# Patient Record
Sex: Male | Born: 1965
Health system: Southern US, Community
[De-identification: ages and names within clinical notes are randomized; demographics above are authoritative.]

## PROBLEM LIST (undated history)

## (undated) DIAGNOSIS — I472 Ventricular tachycardia, unspecified: Secondary | ICD-10-CM

## (undated) DIAGNOSIS — G4733 Obstructive sleep apnea (adult) (pediatric): Secondary | ICD-10-CM

## (undated) DIAGNOSIS — I509 Heart failure, unspecified: Secondary | ICD-10-CM

## (undated) DIAGNOSIS — Z7901 Long term (current) use of anticoagulants: Secondary | ICD-10-CM

## (undated) DIAGNOSIS — Z973 Presence of spectacles and contact lenses: Secondary | ICD-10-CM

## (undated) DIAGNOSIS — I499 Cardiac arrhythmia, unspecified: Secondary | ICD-10-CM

## (undated) DIAGNOSIS — Z9889 Other specified postprocedural states: Secondary | ICD-10-CM

## (undated) DIAGNOSIS — Z9989 Dependence on other enabling machines and devices: Secondary | ICD-10-CM

## (undated) DIAGNOSIS — Z95810 Presence of automatic (implantable) cardiac defibrillator: Secondary | ICD-10-CM

## (undated) DIAGNOSIS — R112 Nausea with vomiting, unspecified: Secondary | ICD-10-CM

## (undated) DIAGNOSIS — I428 Other cardiomyopathies: Secondary | ICD-10-CM

## (undated) DIAGNOSIS — I480 Paroxysmal atrial fibrillation: Secondary | ICD-10-CM

## (undated) HISTORY — DX: Ventricular tachycardia, unspecified: I47.20

## (undated) HISTORY — DX: Long term (current) use of anticoagulants: Z79.01

## (undated) HISTORY — PX: ATRIAL FIBRILLATION ABLATION: SHX5732

## (undated) HISTORY — DX: Paroxysmal atrial fibrillation: I48.0

## (undated) HISTORY — DX: Ventricular tachycardia: I47.2

---

## 1993-03-25 HISTORY — PX: SKIN GRAFT: SHX250

## 2002-01-10 ENCOUNTER — Encounter: Payer: Self-pay | Admitting: Occupational Medicine

## 2002-01-10 ENCOUNTER — Encounter: Admission: RE | Admit: 2002-01-10 | Discharge: 2002-01-10 | Payer: Self-pay | Admitting: Occupational Medicine

## 2003-04-22 ENCOUNTER — Ambulatory Visit (HOSPITAL_COMMUNITY): Admission: RE | Admit: 2003-04-22 | Discharge: 2003-04-22 | Payer: Self-pay | Admitting: Pulmonary Disease

## 2003-05-01 ENCOUNTER — Ambulatory Visit (HOSPITAL_COMMUNITY): Admission: RE | Admit: 2003-05-01 | Discharge: 2003-05-01 | Payer: Self-pay | Admitting: *Deleted

## 2003-05-14 ENCOUNTER — Ambulatory Visit (HOSPITAL_COMMUNITY): Admission: RE | Admit: 2003-05-14 | Discharge: 2003-05-14 | Payer: Self-pay | Admitting: *Deleted

## 2003-05-22 ENCOUNTER — Ambulatory Visit (HOSPITAL_COMMUNITY): Admission: RE | Admit: 2003-05-22 | Discharge: 2003-05-22 | Payer: Self-pay | Admitting: *Deleted

## 2003-08-23 ENCOUNTER — Observation Stay (HOSPITAL_COMMUNITY): Admission: AD | Admit: 2003-08-23 | Discharge: 2003-08-23 | Payer: Self-pay | Admitting: *Deleted

## 2003-10-29 ENCOUNTER — Other Ambulatory Visit: Admission: RE | Admit: 2003-10-29 | Discharge: 2003-10-29 | Payer: Self-pay | Admitting: Dermatology

## 2004-01-14 ENCOUNTER — Ambulatory Visit (HOSPITAL_COMMUNITY): Admission: RE | Admit: 2004-01-14 | Discharge: 2004-01-14 | Payer: Self-pay | Admitting: *Deleted

## 2004-09-07 ENCOUNTER — Ambulatory Visit: Payer: Self-pay | Admitting: *Deleted

## 2004-10-12 ENCOUNTER — Ambulatory Visit: Payer: Self-pay | Admitting: Internal Medicine

## 2004-11-10 ENCOUNTER — Ambulatory Visit: Payer: Self-pay | Admitting: *Deleted

## 2004-11-27 ENCOUNTER — Ambulatory Visit: Payer: Self-pay | Admitting: Internal Medicine

## 2004-12-11 ENCOUNTER — Ambulatory Visit: Payer: Self-pay | Admitting: *Deleted

## 2005-01-08 ENCOUNTER — Ambulatory Visit: Payer: Self-pay | Admitting: Cardiology

## 2005-03-02 ENCOUNTER — Ambulatory Visit: Payer: Self-pay | Admitting: Cardiology

## 2005-03-11 ENCOUNTER — Ambulatory Visit: Payer: Self-pay | Admitting: *Deleted

## 2005-03-18 ENCOUNTER — Ambulatory Visit: Payer: Self-pay | Admitting: *Deleted

## 2005-03-29 ENCOUNTER — Ambulatory Visit: Payer: Self-pay | Admitting: Cardiology

## 2005-04-06 ENCOUNTER — Ambulatory Visit: Payer: Self-pay | Admitting: *Deleted

## 2005-04-28 ENCOUNTER — Ambulatory Visit: Payer: Self-pay | Admitting: Cardiology

## 2005-05-21 ENCOUNTER — Ambulatory Visit: Payer: Self-pay | Admitting: Cardiology

## 2005-07-06 ENCOUNTER — Ambulatory Visit: Payer: Self-pay | Admitting: *Deleted

## 2005-08-05 ENCOUNTER — Ambulatory Visit: Payer: Self-pay | Admitting: Internal Medicine

## 2005-09-07 ENCOUNTER — Ambulatory Visit: Payer: Self-pay | Admitting: *Deleted

## 2005-10-05 ENCOUNTER — Ambulatory Visit: Payer: Self-pay | Admitting: Cardiology

## 2005-11-09 ENCOUNTER — Ambulatory Visit: Payer: Self-pay | Admitting: *Deleted

## 2005-12-13 ENCOUNTER — Ambulatory Visit: Payer: Self-pay | Admitting: *Deleted

## 2006-01-10 ENCOUNTER — Ambulatory Visit: Payer: Self-pay | Admitting: Internal Medicine

## 2006-02-14 ENCOUNTER — Ambulatory Visit: Payer: Self-pay | Admitting: *Deleted

## 2006-03-15 ENCOUNTER — Ambulatory Visit: Payer: Self-pay | Admitting: Cardiology

## 2006-04-18 ENCOUNTER — Ambulatory Visit: Payer: Self-pay | Admitting: *Deleted

## 2006-05-23 ENCOUNTER — Ambulatory Visit: Payer: Self-pay | Admitting: *Deleted

## 2006-06-21 ENCOUNTER — Ambulatory Visit: Payer: Self-pay | Admitting: *Deleted

## 2006-07-19 ENCOUNTER — Ambulatory Visit: Payer: Self-pay | Admitting: Cardiology

## 2006-08-16 ENCOUNTER — Ambulatory Visit: Payer: Self-pay | Admitting: *Deleted

## 2006-09-20 ENCOUNTER — Ambulatory Visit: Payer: Self-pay | Admitting: Cardiovascular Disease

## 2006-10-24 ENCOUNTER — Ambulatory Visit: Payer: Self-pay | Admitting: Internal Medicine

## 2006-11-28 ENCOUNTER — Ambulatory Visit: Payer: Self-pay | Admitting: Cardiology

## 2007-01-02 ENCOUNTER — Ambulatory Visit: Payer: Self-pay | Admitting: Cardiology

## 2007-02-07 ENCOUNTER — Ambulatory Visit: Payer: Self-pay | Admitting: Cardiology

## 2007-02-21 ENCOUNTER — Ambulatory Visit: Payer: Self-pay | Admitting: Cardiovascular Disease

## 2007-03-13 ENCOUNTER — Ambulatory Visit: Payer: Self-pay | Admitting: Cardiology

## 2007-04-17 ENCOUNTER — Ambulatory Visit: Payer: Self-pay | Admitting: Cardiology

## 2007-05-15 ENCOUNTER — Ambulatory Visit: Payer: Self-pay | Admitting: Cardiology

## 2007-05-29 ENCOUNTER — Ambulatory Visit: Payer: Self-pay | Admitting: Cardiology

## 2007-06-27 ENCOUNTER — Ambulatory Visit: Payer: Self-pay | Admitting: Cardiovascular Disease

## 2007-07-31 ENCOUNTER — Ambulatory Visit: Payer: Self-pay | Admitting: Cardiovascular Disease

## 2007-09-04 ENCOUNTER — Ambulatory Visit: Payer: Self-pay | Admitting: Cardiology

## 2007-10-02 ENCOUNTER — Ambulatory Visit: Payer: Self-pay | Admitting: Cardiology

## 2007-11-06 ENCOUNTER — Ambulatory Visit: Payer: Self-pay | Admitting: Cardiology

## 2007-12-11 ENCOUNTER — Ambulatory Visit: Payer: Self-pay | Admitting: Cardiology

## 2008-01-08 ENCOUNTER — Ambulatory Visit: Payer: Self-pay | Admitting: Cardiology

## 2008-02-06 ENCOUNTER — Ambulatory Visit: Payer: Self-pay | Admitting: Cardiology

## 2008-03-05 ENCOUNTER — Ambulatory Visit: Payer: Self-pay | Admitting: Cardiology

## 2008-03-05 ENCOUNTER — Encounter (INDEPENDENT_AMBULATORY_CARE_PROVIDER_SITE_OTHER): Payer: Self-pay | Admitting: *Deleted

## 2008-03-05 LAB — CONVERTED CEMR LAB
Cholesterol: 150 mg/dL
HDL: 50 mg/dL
LDL Cholesterol: 81 mg/dL
Triglycerides: 94 mg/dL

## 2008-03-25 ENCOUNTER — Ambulatory Visit: Payer: Self-pay | Admitting: Cardiology

## 2008-05-06 ENCOUNTER — Ambulatory Visit: Payer: Self-pay | Admitting: Cardiology

## 2008-06-10 ENCOUNTER — Encounter (INDEPENDENT_AMBULATORY_CARE_PROVIDER_SITE_OTHER): Payer: Self-pay | Admitting: *Deleted

## 2008-06-10 ENCOUNTER — Ambulatory Visit: Payer: Self-pay | Admitting: Cardiology

## 2008-06-10 LAB — CONVERTED CEMR LAB
ALT: 30 units/L
AST: 20 units/L
Albumin: 4 g/dL
Alkaline Phosphatase: 84 units/L
BUN: 10 mg/dL
CO2: 29 meq/L
Calcium: 9.4 mg/dL
Chloride: 105 meq/L
Creatinine, Ser: 0.8 mg/dL
Glucose, Bld: 93 mg/dL
INR: 2.9
Magnesium: 2.1 mg/dL
Potassium: 4.5 meq/L
Prothrombin Time: 32.8 s
Sodium: 138 meq/L
Total Protein: 6.4 g/dL

## 2008-06-17 ENCOUNTER — Ambulatory Visit: Payer: Self-pay | Admitting: Cardiology

## 2008-06-18 ENCOUNTER — Ambulatory Visit (HOSPITAL_COMMUNITY): Admission: RE | Admit: 2008-06-18 | Discharge: 2008-06-18 | Payer: Self-pay | Admitting: Cardiology

## 2008-06-18 ENCOUNTER — Encounter: Payer: Self-pay | Admitting: Cardiology

## 2008-06-18 ENCOUNTER — Ambulatory Visit: Payer: Self-pay | Admitting: Cardiology

## 2009-01-28 ENCOUNTER — Ambulatory Visit: Payer: Self-pay | Admitting: Cardiology

## 2009-07-07 ENCOUNTER — Encounter: Payer: Self-pay | Admitting: Cardiology

## 2010-04-07 ENCOUNTER — Encounter (INDEPENDENT_AMBULATORY_CARE_PROVIDER_SITE_OTHER): Payer: Self-pay | Admitting: *Deleted

## 2010-04-10 ENCOUNTER — Ambulatory Visit: Payer: Self-pay | Admitting: Cardiology

## 2010-04-10 ENCOUNTER — Encounter: Payer: Self-pay | Admitting: *Deleted

## 2010-04-10 LAB — CONVERTED CEMR LAB
ALT: 26 units/L
ALT: 26 units/L (ref 0–53)
AST: 14 units/L
AST: 14 units/L (ref 0–37)
Albumin: 4.5 g/dL
Albumin: 4.5 g/dL (ref 3.5–5.2)
Alkaline Phosphatase: 81 units/L
Alkaline Phosphatase: 81 units/L (ref 39–117)
BUN: 14 mg/dL
BUN: 14 mg/dL (ref 6–23)
CO2: 24 meq/L
CO2: 24 meq/L (ref 19–32)
Calcium: 9.8 mg/dL
Calcium: 9.8 mg/dL (ref 8.4–10.5)
Chloride: 103 meq/L
Chloride: 103 meq/L (ref 96–112)
Creatinine, Ser: 0.94 mg/dL
Creatinine, Ser: 0.94 mg/dL (ref 0.40–1.50)
Glucose, Bld: 92 mg/dL
Glucose, Bld: 92 mg/dL (ref 70–99)
Magnesium: 1.8 mg/dL
Magnesium: 1.8 mg/dL (ref 1.5–2.5)
Potassium: 4.3 meq/L
Potassium: 4.3 meq/L (ref 3.5–5.3)
Sodium: 139 meq/L
Sodium: 139 meq/L (ref 135–145)
Total Bilirubin: 0.5 mg/dL (ref 0.3–1.2)
Total Protein: 6.9 g/dL
Total Protein: 6.9 g/dL (ref 6.0–8.3)

## 2010-04-13 ENCOUNTER — Encounter: Payer: Self-pay | Admitting: Cardiology

## 2010-05-27 ENCOUNTER — Encounter (INDEPENDENT_AMBULATORY_CARE_PROVIDER_SITE_OTHER): Payer: Self-pay | Admitting: *Deleted

## 2010-11-24 NOTE — Letter (Signed)
Summary: [place] Results Engineer, agricultural at [facility]  [address] [place] [address]   Phone: [phone]  Fax: [phone]      May 27, 2010 MRN: [id number]   [facility] [name] [place], [place]  [address]   Dear Mr. Crist,  Your test ordered by Selena Batten has been reviewed by your physician (or physician assistant) and was found to be normal or stable. Your physician (or physician assistant) felt no changes were needed at this time.  ____ Echocardiogram  ____ Cardiac Stress Test  __x__ Lab Work  ____ Peripheral vascular study of arms, legs or neck  ____ CT scan or X-ray  ____ Lung or Breathing test  ____ Other:  No change in medical treatment at this time, per Dr. Dietrich Pates.  Enclosed is a copy of your labwork for your records.  Thank you, Jaylend Reiland Allyne Gee RN    [place] Bing, MD, Lenise Arena.C.Gaylord Shih, MD, F.A.C.C Lewayne Bunting, MD, F.A.C.C Nona Dell, MD, F.A.C.C Charlton Haws, MD, Lenise Arena.C.C

## 2010-11-24 NOTE — Letter (Signed)
Summary: Work Writer at [employer]  [address], [place] [address]   Phone: [phone]  Fax: [phone]     April 10, 2010    Brandon Torres   The above named patient had a medical visit today at:  2:30 pm.  Please take this into consideration when reviewing the time away from work/school.      Sincerely yours,   Architectural technologist

## 2010-11-24 NOTE — Assessment & Plan Note (Signed)
Summary: H0Q      Allergies Added: NKDA  Visit Type:  Follow-up Referring Provider:  EP-Dr. Ileana Torres Primary Provider:  Dr. Kari Torres   History of Present Illness: [name] returns to the office as scheduled for continued assessment and treatment of paroxysmal atrial fibrillation.  He remains asymptomatic from a cardiovascular standpoint denying chest discomfort, dyspnea, orthopnea, PND or palpitations.  He continues to be seen annually in the [facility] with apparent control of arrhythmia by Tikosyn.  He has developed no new medical conditions, has not been seen in the emergency department nor has he been hospitalized in the year since his last visit.  Current Medications (verified): 1)  Lisinopril 5 Mg Tabs (Lisinopril) .... Take 1 Tab Daily 2)  Tikosyn 500 Mcg Caps (Dofetilide) .... Take 1 Tab Two Times A Day 3)  Aspir-Low 81 Mg Tbec (Aspirin) .... Take 1 Tab Daily 4)  Daily Multi  Tabs (Multiple Vitamins-Minerals) .... Take 1 Tab Daily  Allergies (verified): No Known Drug Allergies  Past History:   PMH, FH, and Social History reviewed and updated.  Review of Systems       See history of present illness.  Vital Signs:  Patient profile:   45 year old male Height:      72 inches Weight:      296 pounds BMI:     40.29 Pulse rate:   64 / minute BP sitting:   98 / 63  (right arm)  Vitals Entered By: Brandon Saa, CNA (April 10, 2010 2:12 PM)  Physical Exam  General:  No acute distress:   Neck-No JVD; no carotid bruits: Lungs-No tachypnea, no rales; no rhonchi; no wheezes: Cardiovascular-normal PMI; normal S1 and S2: Abdomen-BS normal; soft and non-tender without masses or organomegaly:  Musculoskeletal-No deformities, no cyanosis or clubbing: Neurologic-Normal cranial nerves; symmetric strength and tone:  Skin-Warm, no significant lesions: Extremities-Nl distal pulses; no edema:     EKG  Procedure date:  04/10/2010  Findings:  Normal sinus rhythm Low-voltage Delayed R-wave progression IVCD Nonspecific T wave abnormality No change when compared to a previous tracing of 01/28/09, except that ectopy not currently noted   Impression & Recommendations:  Problem # 1:  ATRIAL FIBRILLATION-PAROXYSMAL (ICD-427.31) [name] is doing very well with current therapy.  Dofetilide will be continued.  Chemistry profile and a magnesium level have been ordered.  Due to modest risk of thromboembolism and minimal, if any, time in atrial fibrillation, anticoagulation is not warranted.  Aspirin therapy will be continued.  I will see this nice gentleman again in 9 months so that Dr. Ileana Torres and I can alternate visits every 6 months.  Other Orders: EKG w/ Interpretation (93000) T-Comprehensive Metabolic Panel ([id number]) T-Magnesium ([id number]) EKG w/ Interpretation (93000)  Patient Instructions: 1)  Your physician recommends that you schedule a follow-up appointment in: 9 months 2)  Your physician recommends that you return for lab work in: today

## 2010-11-24 NOTE — Miscellaneous (Signed)
Summary: labs 06/10/2008 cmp,pt,lipids,liver,magnesium  Clinical Lists Changes  Observations: Added new observation of MAGNESIUM: 2.1 mg/dL (04/54/0981 19:14) Added new observation of CALCIUM: 9.4 mg/dL (78/29/5621 30:86) Added new observation of ALBUMIN: 4.0 g/dL (57/84/6962 95:28) Added new observation of PROTEIN, TOT: 6.4 g/dL (41/32/4401 02:72) Added new observation of SGPT (ALT): 30 units/L (06/10/2008 16:18) Added new observation of SGOT (AST): 20 units/L (06/10/2008 16:18) Added new observation of ALK PHOS: 84 units/L (06/10/2008 16:18) Added new observation of CREATININE: 0.80 mg/dL (53/66/4403 47:42) Added new observation of BUN: 10 mg/dL (59/56/3875 64:33) Added new observation of BG RANDOM: 93 mg/dL (29/51/8841 66:06) Added new observation of CO2 PLSM/SER: 29 meq/L (06/10/2008 16:18) Added new observation of CL SERUM: 105 meq/L (06/10/2008 16:18) Added new observation of K SERUM: 4.5 meq/L (06/10/2008 16:18) Added new observation of NA: 138 meq/L (06/10/2008 16:18) Added new observation of INR: 2.9  (06/10/2008 16:18) Added new observation of PT PATIENT: 32.8 s (06/10/2008 16:18) Added new observation of LDL: 81 mg/dL (30/16/0109 32:35) Added new observation of HDL: 50 mg/dL (57/32/2025 42:70) Added new observation of TRIGLYC TOT: 94 mg/dL (62/37/6283 15:17) Added new observation of CHOLESTEROL: 150 mg/dL (61/60/7371 06:26)

## 2010-11-27 NOTE — Consult Note (Signed)
Summary: Report from [facility]  Report from [facility]   Imported By: Raechel Ache [facility] 07/31/2009 10:44:24  _____________________________________________________________________  External Attachment:    Type:   Image     Comment:   External Document

## 2011-01-08 ENCOUNTER — Encounter: Payer: Self-pay | Admitting: *Deleted

## 2011-01-11 ENCOUNTER — Ambulatory Visit (INDEPENDENT_AMBULATORY_CARE_PROVIDER_SITE_OTHER): Payer: 59 | Admitting: Cardiology

## 2011-01-11 ENCOUNTER — Encounter: Payer: Self-pay | Admitting: Cardiology

## 2011-01-11 DIAGNOSIS — I4891 Unspecified atrial fibrillation: Secondary | ICD-10-CM

## 2011-01-12 NOTE — Miscellaneous (Signed)
Summary: labs cmp,magnesium,04/10/2010  Clinical Lists Changes  Observations: Added new observation of MAGNESIUM: 1.8 mg/dL (16/07/9603 54:09) Added new observation of CALCIUM: 9.8 mg/dL (81/19/1478 29:56) Added new observation of ALBUMIN: 4.5 g/dL (21/30/8657 84:69) Added new observation of PROTEIN, TOT: 6.9 g/dL (62/95/2841 32:44) Added new observation of SGPT (ALT): 26 units/L (04/10/2010 16:23) Added new observation of SGOT (AST): 14 units/L (04/10/2010 16:23) Added new observation of ALK PHOS: 81 units/L (04/10/2010 16:23) Added new observation of CREATININE: 0.94 mg/dL (10/27/7251 66:44) Added new observation of BUN: 14 mg/dL (03/47/4259 56:38) Added new observation of BG RANDOM: 92 mg/dL (75/64/3329 51:88) Added new observation of CO2 PLSM/SER: 24 meq/L (04/10/2010 16:23) Added new observation of CL SERUM: 103 meq/L (04/10/2010 16:23) Added new observation of K SERUM: 4.3 meq/L (04/10/2010 16:23) Added new observation of NA: 139 meq/L (04/10/2010 16:23)

## 2011-01-15 ENCOUNTER — Other Ambulatory Visit: Payer: Self-pay | Admitting: Cardiology

## 2011-01-15 LAB — COMPREHENSIVE METABOLIC PANEL
ALT: 16 U/L (ref 0–53)
AST: 15 U/L (ref 0–37)
Albumin: 4.2 g/dL (ref 3.5–5.2)
Alkaline Phosphatase: 86 U/L (ref 39–117)
BUN: 15 mg/dL (ref 6–23)
CO2: 26 mEq/L (ref 19–32)
Calcium: 9.3 mg/dL (ref 8.4–10.5)
Chloride: 103 mEq/L (ref 96–112)
Creat: 0.85 mg/dL (ref 0.40–1.50)
Glucose, Bld: 95 mg/dL (ref 70–99)
Potassium: 4.3 mEq/L (ref 3.5–5.3)
Sodium: 138 mEq/L (ref 135–145)
Total Bilirubin: 0.6 mg/dL (ref 0.3–1.2)
Total Protein: 6.8 g/dL (ref 6.0–8.3)

## 2011-01-15 LAB — MAGNESIUM: Magnesium: 1.7 mg/dL (ref 1.5–2.5)

## 2011-01-19 ENCOUNTER — Telehealth: Payer: Self-pay | Admitting: *Deleted

## 2011-01-19 NOTE — Telephone Encounter (Signed)
Message copied by Teressa Lower on Tue Jan 19, 2011  4:32 PM ------      Message from: [place] Bing      Created: Tue Jan 19, 2011  3:06 PM       Magnesium level is mildly low.              Mag oxide  400 mg Q M,W,F.      Bmet in 1 month.

## 2011-01-20 ENCOUNTER — Telehealth: Payer: Self-pay | Admitting: *Deleted

## 2011-01-20 DIAGNOSIS — R790 Abnormal level of blood mineral: Secondary | ICD-10-CM

## 2011-01-20 MED ORDER — MAGNESIUM OXIDE 400 MG PO CAPS: 1.0000 | ORAL_CAPSULE | ORAL | Status: DC

## 2011-01-20 NOTE — Progress Notes (Signed)
Results called to pt

## 2011-01-20 NOTE — Telephone Encounter (Signed)
Message copied by Teressa Lower on Wed Jan 20, 2011  8:43 AM ------      Message from: [place] Bing      Created: Tue Jan 19, 2011  3:06 PM       Magnesium level is mildly low.              Mag oxide  400 mg Q M,W,F.      Bmet in 1 month.

## 2011-01-20 NOTE — Telephone Encounter (Signed)
Pt's cell number [phone], he works 2nd shift .

## 2011-01-20 NOTE — Telephone Encounter (Signed)
Left instructions on pt's telephone, pt return a message that he understood. Sent rx in to [facility] and ordered labs for 1 month

## 2011-01-21 NOTE — Assessment & Plan Note (Signed)
Summary: RETURN OFFICE VISIT      Allergies Added: NKDA  Visit Type:  Follow-up Referring Provider:  EP-Dr. Ileana Ladd Primary Provider:  Dr. Kari Baars   History of Present Illness: [name]   returns to the office for an annual visit for atrial fibrillation.  He was seen by the electrophysiologist at [facility] in followup 6 months ago and was told that he was doing well.  No laboratory studies were performed.  He has noted no palpitations in recent months, but did experience dramatic symptoms when in atrial fibrillation in the past.  He has had no lightheadedness or syncope.  He denies chest discomfort, dyspnea and pedal edema.  Current Medications (verified): 1)  Lisinopril 5 Mg Tabs (Lisinopril) .... Take 1 Tab Daily 2)  Tikosyn 500 Mcg Caps (Dofetilide) .... Take 1 Tab Two Times A Day 3)  Aspir-Low 81 Mg Tbec (Aspirin) .... Take 1 Tab Daily  Allergies (verified): No Known Drug Allergies  Comments:  Nurse/Medical Assistant: no meds no list patient and i reviewed meds [facility]  Past History:  PMH, FH, and Social History reviewed and updated.  Past Medical History: PAF :RFA at [facility] in 2005->RECUR-> dofetilide->D/C 5/09; AF recurred->  Tikosyn resumed;       Followed by Dr. Amada Jupiter, Division of Cardiology, [facility], [place], [place], [address]      [phone]; Fax: [phone] Anticoagulation-discontinued 2010 MILD CARDIOMYOPATHY-nonischemic; nonobstructive CAD in 7/04 with LV dilation and EF of 30%;50% in 8/09 Hyperlipidemia  EKG  Procedure date:  01/11/2011  Findings:      Normal sinus rhythm Low-voltage Especially low-voltage P waves, atrial activity is most apparent in leads aVR, aVF, and V3. Delayed R-wave progression, cannot exclude previous anteroseptal MI Nonspecific ST-T wave abnormality Comparison to previous tracing performed 01/28/09, axis has shifted leftward; otherwise no significant interval change    Review of  Systems       See history of present illness.  Vital Signs:  Patient profile:   45 year old male Weight:      299 pounds BMI:     40.70 O2 Sat:      96 % on Room air Pulse rate:   70 / minute BP sitting:   101 / 57  (left arm)  Vitals Entered By: Dreama Saa, CNA (January 11, 2011 2:13 PM)  O2 Flow:  Room air  Physical Exam  General:  Overweight; no acute distress Neck-No JVD; no carotid bruits: Lungs-No tachypnea, no rales; no rhonchi; no wheezes: Cardiovascular-normal PMI; normal S1 and S2; occasional premature Abdomen-BS normal; soft and non-tender without masses or organomegaly:  Musculoskeletal-No deformities, no cyanosis or clubbing: Neurologic-Normal cranial nerves; symmetric strength and tone:  Skin-Warm, no significant lesions: Extremities-Nl distal pulses; no edema:     Impression & Recommendations:  Problem # 1:  ATRIAL FIBRILLATION-PAROXYSMAL (ICD-427.31) From a clinical standpoint, atrial fibrillation appears to be entirely controlled.  In the absence of risk factors for thromboembolism, aspirin serves as adequate antithrombotic therapy.  A chemistry profile and magnesium level will be obtained.  I will reassess this nice gentleman one year.  Other Orders: T-Comprehensive Metabolic Panel [phone]) T-Magnesium [phone])  Patient Instructions: 1)  Your physician recommends that you schedule a follow-up appointment in: 1 year 2)  Your physician recommends that you return for lab work in: today

## 2011-03-09 NOTE — Letter (Signed)
June 10, 2008    Ramon Dredge L. Juanetta Gosling, MD  [address]  [place], [place] [address]   RE:  ALISHA, BURGO  MRN:  [id number]  /  DOB:  03-Oct-1966   Dear Renae Fickle,   Mr. Crumble returns to the office after a long hiatus.  He previously was  under the care of Dr. Dorethea Clan, who shared management of his atrial  arrhythmias with Dr. Ileana Ladd at [facility] [place].  Mr. Rosenzweig  presented with idiopathic atrial fibrillation in 2005.  He underwent  radiofrequency ablation at that time, but subsequently recurred  prompting initiation of treatment with dofetilide.  He did well until  May 2009 when he was reevaluated at [place] and dofetilide was stopped.  Atrial fibrillation recurred in short order.  Since resumption of  dofetilide, the patient has felt fine.  He was felt to have a mild  cardiomyopathy when he first presented.  Cardiac catheterization  excluded coronary artery disease.  At the present time, he is  asymptomatic and leads a reasonably active lifestyle without  cardiopulmonary symptoms.   CURRENT MEDICATIONS:  1. Warfarin as directed with stable and therapeutic anticoagulation.  2. Atorvastatin 40 mg daily.  3. Lisinopril 5 mg daily.  4. Carvedilol 6.25 mg b.i.d.  5. Dofetilide 500 mg b.i.d.   PHYSICAL EXAMINATION:  GENERAL:  On exam, healthy-appearing gentleman,  who is overweight, but in no acute distress.  VITAL SIGNS:  The weight is 313, 33 pounds more than in 2007.  Blood  pressure 100/60, heart rate 65 and regular, respirations 14.  NECK:  No jugular venous distention; normal carotid upstrokes without  bruits.  LUNGS:  Clear.  CARDIAC:  Normal first and second heart sounds; no murmur or gallop  appreciated.  ABDOMEN:  Soft and nontender; no organomegaly.  EXTREMITIES:  No edema; normal distal pulses.   Recent laboratory shows a therapeutic INR of 2.9, a normal chemistry  profile, and a magnesium level of 2.1.   Rhythm strip:  Sinus bradycardia.   IMPRESSION:  Mr.  Rohleder has idiopathic atrial fibrillation without  definite risk factors for thromboembolism.  Although, he initially had  left ventricular dysfunction in 2004, he has not had clinical congestive  heart failure.  Moreover, he appears to have excellent control of his  arrhythmia with dofetilide.  Accordingly, I believe that the warfarin  can be discontinued.  In order to maximize the safety of this, we will  perform a transesophageal echocardiogram to exclude high risk features  for thromboembolism.  If negative, as expected, aspirin 81 mg will be  substituted for warfarin.  I will plan to see this nice gentleman again  in 6 months.  We will follow potassium values and magnesium to maximize  the safety of dofetilide therapy.    Sincerely,      Gerrit Friends. Dietrich Pates, MD, [facility]  Electronically Signed    RMR/MedQ  DD: 06/18/2008  DT: 06/19/2008  Job #: [id number]

## 2011-03-09 NOTE — Letter (Signed)
January 28, 2009    Edward L. Juanetta Gosling, MD  [address]  [place], [place] [address]   RE:  Brandon Torres, Brandon Torres  MRN:  [id number]  /  DOB:  11-17-1965   Dear Dr. Juanetta Gosling,   Brandon Torres returns to the office for continued assessment and treatment  of paroxysmal atrial fibrillation.  Since last visit, he has had no  symptoms.  He never experiences palpitations.  He is active without  dyspnea or chest discomfort.  He was last seen at [facility] [place]  in September of last year, at which point no modification in his medical  regime was recommended.   CURRENT MEDICATIONS:  1. Atorvastatin 40 mg daily.  2. Lisinopril 5 mg daily.  3. Carvedilol 6.25 mg b.i.d.  4. Dofetilide 500 mcg b.i.d.  5. Aspirin 81 mg daily.  6. Multivitamin.   PHYSICAL EXAMINATION:  GENERAL:  Husky pleasant gentleman in no acute  distress.  VITAL SIGNS:  The weight is 306, 7 pounds less than in August 2009.  Blood pressure 95/70, heart rate is 70 and regular, respirations 12 and  unlabored.  NECK:  No jugular venous distention; normal carotid upstrokes without  bruits.  SKIN:  Plethoric complexion.  LUNGS:  Clear.  CARDIAC:  Normal first and second heart sounds.  ABDOMEN:  Soft and nontender; no organomegaly.  EXTREMITIES:  No edema.  NEUROLOGIC:  Symmetric strength and tone.   EKG:  Normal sinus with a single PAC conducted with a first-degree AV  block and aberrancy; borderline IVCD; delayed R-wave progression; rare  PVC; low voltage; minor nonspecific T-wave abnormality.  Comparison with  March 25, 2008:  [facility] now present.   The most recent chemistry profile was from last year at which time  magnesium and potassium were normal as well as renal function.   IMPRESSION:  Brandon Torres is doing extremely well symptomatically.  Although, he has a history of cardiomyopathy with an ejection fraction  of 30% at catheterization in 2004, he has never had clinical congestive  heart failure, and more recent  echocardiograms have shown just a  slightly subnormal left ventricular performance.  His minimal dose of  carvedilol was probably not of benefit and will be discontinued.  The  indication for atorvastatin is uncertain.  This medication will be  discontinued as well.  A lipid profile and chemistry profile will be  obtained in 1 month.  I will see this nice gentleman again in 1 year.    Sincerely,      Gerrit Friends. Dietrich Pates, MD, [facility]  Electronically Signed    RMR/MedQ  DD: 01/28/2009  DT: 01/29/2009  Job #: [id number]   CC:    Dr. Thelma Barge

## 2011-03-12 NOTE — Discharge Summary (Signed)
NAME:  Brandon Torres, Brandon Torres                        ACCOUNT NO.:  [id number]   MEDICAL RECORD NO.:  [id number]                   PATIENT TYPE:  OIB   LOCATION:  2899                                 FACILITY:  [facility]   PHYSICIAN:  Vida Roller, M.D.                DATE OF BIRTH:  03-Oct-1966   DATE OF ADMISSION:  05/22/2003  DATE OF DISCHARGE:  05/22/2003                                 DISCHARGE SUMMARY   HISTORY OF PRESENT ILLNESS:  The patient is a 45 year old gentleman with new  onset atrial fibrillation who was found to have on echocardiogram  cardiomyopathy with an ejection fraction of 30-35%.  He was referred for  selective coronary angiography as a screening for significant coronary  disease as a cause for his cardiomyopathy.  After obtaining informed  consent, the patient was brought to the cardiac catheterization laboratory  in the fasting state.  There he was prepped and draped in the usual sterile  manner and local anesthetic was obtained over the right wrist using 1%  lidocaine without epinephrine.  The right radial artery was cannulated using  the modified Seldinger technique with the 6 French 10 cm sheath and left  heart catheterization was performed using a 6 French Judkins left #4, status  post Jenkins right #4 and a 6 French pigtail catheter.   At the conclusion of the procedure, the catheters were removed.  A radstat  device was placed to obtain hemostasis.  Distal perfusion was confirmed  using pulse oximetry on the thumb and third finger.  The patient was then  transported to the holding in excellent condition without any complications.   RESULTS:  1. Aortic pressure was measured at 110/80 with a mean arterial pressure of     94 mmHg.  2. Left ventricular pressure was measured at 110/19 with an end-diastolic     pressure of 23 mmHg.   Selective coronary angiography:  1. The left main coronary artery is a large caliber vessel with no     significant coronary  disease.  2. Left anterior descending coronary artery is a large transapical vessel     with three diagonal branches.  There is no significant disease in the     left anterior descending artery.  3. The left circumflex coronary artery is a large caliber vessel.  It has     three obtuse marginals and one posterolateral branch and is free of     disease.  4. The right coronary artery is a large dominant vessel which has a moderate     caliber posterior descending and two moderate caliber posterolateral     branches.  There is no significant disease in the right coronary     distribution.   Left ventriculography:  Reveals a dilated ventricle and a ejection fraction  of 30%, 1+ MR, and global hypokinesis.   ASSESSMENT:  A gentleman with a nonischemic dilated cardiomyopathy  with  significant decrement of his left ventricular function, 1+ mitral  regurgitation, and normal coronaries.   RECOMMENDATIONS:  Our recommendation is that aggressive medical management  be made to address both his atrial fibrillation and his left ventricular  dysfunction.                                                Vida Roller, M.D.   JH/MEDQ  D:  05/22/2003  T:  05/22/2003  Job:  [id number]

## 2011-03-12 NOTE — Procedures (Signed)
NAME:  Brandon Torres, Brandon Torres                        ACCOUNT NO.:  [id number]   MEDICAL RECORD NO.:  [id number]                   PATIENT TYPE:  AMB   LOCATION:  DAY                                  FACILITY:  [facility]   PHYSICIAN:  Vida Roller, M.D.                DATE OF BIRTH:  1966/08/15   DATE OF PROCEDURE:  DATE OF DISCHARGE:                          TRANSESOPHAGEAL ECHOCARDIOGRAM   PROCEDURE:  Transesophageal echocardiogram.   PRIMARY CARE PHYSICIAN:  Edward L. Juanetta Gosling, M.D.   TAPE NUMBER:  [id number], tape count [id number] through [id number].   HISTORY OF PRESENT ILLNESS:  This is a 45 year old male with atrial  fibrillation.  This is an assessment to rule out intraatrial thrombus  secondary to plan to start antiarrhythmic therapy with potential for  chemical cardioversion.   DETAILS OF THE PROCEDURE:  The patient was brought to the same-day surgery  area where he was placed on a gurney.  Continuous electrocardiographic,  blood pressure and pulse oximetry were obtained and monitoring obtained.  The patient then received topical lidocaine spray to the back of his throat  to ameliorate his gag reflux, and he then received conscious sedation to a  total of 2 mg of Versed, 50 micrograms of fentanyl, to achieve adequate  conscious sedation.  The Olympus transesophageal echo probe was then  positioned in the transgastric position where echocardiographic images were  obtained.  It was then moved into the mid esophageal plane where multiple  images were obtained.  The transesophageal echo probe was then removed.  The  patient tolerated the procedure well and had no complications.  He was  discharged to home in good condition.   RESULTS:  1. The left ventricle is normal size with mild left ventricular hypertrophy.     Depressed LV function with an estimated ejection fraction of 40-45%.     There are no obvious wall motion abnormalities seen.  Diastolic function     was not assessed.  2. The right  ventricle is top normal in size with normal contractile     function.  3. The atria are both enlarged.  The left atrium appears to have four normal     pulmonary veins.  There is a left atrial appendage easily seen.  It     appears to have a small area of hypermobile material at its base which     can not be excluded, pectinate muscle versus clot.  There is definite     phasic flow in the left atrial appendage with evidence of some     contractile function even though the patient is in atrial fibrillation.  4. The aortic valve is trileaflet, tricommisure, with no evidence of     stenosis or regurgitation.  5. The mitral valve is mildly thickened at its leaflet tip.  There is     trivial insufficiency.  No stenosis is seen.  6. The tricuspid valve is morphologically  unremarkable with trivial     insufficiency.  7. The pulmonic valve is morphologically unremarkable with trivial     insufficiency.  No stenosis is seen.  8. The ascending aorta and aortic arch and descending aorta to the limits of     the study appeared normal.  9. The pericardial structures appeared normal.  10.      The inferior vena cava appears to be normal.  There is small     eustachian valve seen which is of no clinical significance.   ASSESSMENT:  We can not rule out a thrombus seen in the left atrial  appendage.  We recommend therefore that the patient be monitored on Coumadin  for four weeks until he is therapeutic, and then ___ loading will occur at  that time.      ___________________________________________                                            Vida Roller, M.D.   JH/MEDQ  D:  01/14/2004  T:  01/15/2004  Job:  [id number]

## 2011-03-12 NOTE — Procedures (Signed)
   NAME:  Brandon Torres, Brandon Torres                        ACCOUNT NO.:  [id number]   MEDICAL RECORD NO.:  [id number]                   PATIENT TYPE:  OUT   LOCATION:  RAD                                  FACILITY:  [facility]   PHYSICIAN:  Vida Roller, M.D.                DATE OF BIRTH:  1966/10/25   DATE OF PROCEDURE:  DATE OF DISCHARGE:                                  ECHOCARDIOGRAM   TAPE NUMBER:  [id number]   TAPE COUNT:  [id number]   CLINICAL INFORMATION:  This is a 45 year old gentleman with atrial  fibrillation.   TECHNICAL QUALITY:  Good.   M-MODE TRACINGS:  1. The aorta is 31 mm.  2. The left atrium is 48 mm.  3. The septum is 10 mm.  4. The posterior wall is 9 mm.  5. The left ventricular diastolic dimension is 66 mm.  6. The left ventricular systolic dimension is 59 mm.   2-D AND DOPPLER IMAGING:  1. The left ventricle is dilated in both systolic and diastole.  There is     evidence of calcified trabecula in the apex which was concerning for     thrombus; however, contrast study indicated that there is no thrombus in     the apex.  Overall ejection fraction is estimated at 30-35%.  There is     global hypokinesis.  2. The right ventricle is dilated.  There is a hypokinetic free wall.  3. Both atria are enlarged.  There is no obvious atrioseptal defect.  4. The aortic valve is trileaflet and tricommisural with no evidence of     stenosis or regurgitation.  5. The mitral valve is morphologically unremarkable except for mild annular     dilatation.  There is mild insufficiency.  No stenosis is seen.  6. The tricuspid valve is morphologically unremarkable except for annular     dilatation.  There is mild insufficiency.  No stenosis is seen.  7. Estimated right ventricular systolic pressure by regurgitation velocity     is 35-40 mmHg.  8. The pulmonic valve was not well seen.  9.     The ascending aorta appeared normal.  10.      The inferior vena cava appears normal.  11.       The pericardial structures appear normal.                                                Vida Roller, M.D.    JH/MEDQ  D:  05/01/2003  T:  05/02/2003  Job:  [id number]

## 2011-10-20 ENCOUNTER — Encounter: Payer: Self-pay | Admitting: Cardiology

## 2011-10-23 ENCOUNTER — Other Ambulatory Visit: Payer: Self-pay | Admitting: Cardiology

## 2012-01-03 ENCOUNTER — Ambulatory Visit (INDEPENDENT_AMBULATORY_CARE_PROVIDER_SITE_OTHER): Payer: 59 | Admitting: Adult Health

## 2012-01-03 ENCOUNTER — Ambulatory Visit: Payer: 59 | Admitting: Adult Health

## 2012-01-03 ENCOUNTER — Encounter: Payer: Self-pay | Admitting: Adult Health

## 2012-01-03 VITALS — BP 116/69 | HR 62 | Resp 18 | Ht 72.0 in | Wt 307.0 lb

## 2012-01-03 DIAGNOSIS — I4891 Unspecified atrial fibrillation: Secondary | ICD-10-CM

## 2012-01-03 MED ORDER — MAGNESIUM OXIDE 400 MG PO TABS: 400.0000 mg | ORAL_TABLET | ORAL | Status: DC

## 2012-01-03 NOTE — Assessment & Plan Note (Signed)
He is doing very well. No complaints. Medically compliant. He is seeing [facility] every 6 months as well. Will have BMET, fasting lipids and LFT's completed. Records will be sent to [facility] on results. Will refill his magnesium dose.
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
## 2012-01-03 NOTE — Patient Instructions (Signed)
Your physician recommends that you schedule a follow-up appointment in: 6 months  Your physician recommends that you return for lab work in: This week   

## 2012-01-04 LAB — LIPID PANEL
Cholesterol: 192 mg/dL (ref 0–200)
HDL: 46 mg/dL (ref >39)
LDL Cholesterol: 133 mg/dL — ABNORMAL HIGH (ref 0–99)
Total CHOL/HDL Ratio: 4.2 Ratio
Triglycerides: 66 mg/dL (ref <150)
VLDL: 13 mg/dL (ref 0–40)

## 2012-01-04 LAB — HEPATIC FUNCTION PANEL
ALT: 21 U/L (ref 0–53)
AST: 20 U/L (ref 0–37)
Albumin: 4.2 g/dL (ref 3.5–5.2)
Alkaline Phosphatase: 78 U/L (ref 39–117)
Bilirubin, Direct: 0.1 mg/dL (ref 0.0–0.3)
Indirect Bilirubin: 0.4 mg/dL (ref 0.0–0.9)
Total Bilirubin: 0.5 mg/dL (ref 0.3–1.2)
Total Protein: 6.6 g/dL (ref 6.0–8.3)

## 2012-01-04 LAB — BASIC METABOLIC PANEL
BUN: 15 mg/dL (ref 6–23)
CO2: 27 mEq/L (ref 19–32)
Calcium: 9.3 mg/dL (ref 8.4–10.5)
Chloride: 103 mEq/L (ref 96–112)
Creat: 0.84 mg/dL (ref 0.50–1.35)
Glucose, Bld: 91 mg/dL (ref 70–99)
Potassium: 4.7 mEq/L (ref 3.5–5.3)
Sodium: 139 mEq/L (ref 135–145)

## 2012-01-07 ENCOUNTER — Telehealth: Payer: Self-pay

## 2012-01-07 NOTE — Telephone Encounter (Signed)
**Note De-identified Fremon Zacharia Obfuscation**  **Note De-Identified Marquelle Balow Obfuscation** Lab results from 3-11 faxed to Dr. Chelsea Aus at [phone]./LV

## 2012-07-03 DIAGNOSIS — I493 Ventricular premature depolarization: Secondary | ICD-10-CM | POA: Insufficient documentation

## 2012-07-06 DIAGNOSIS — I4892 Unspecified atrial flutter: Secondary | ICD-10-CM | POA: Insufficient documentation

## 2012-07-11 DIAGNOSIS — I10 Essential (primary) hypertension: Secondary | ICD-10-CM | POA: Insufficient documentation

## 2012-08-02 ENCOUNTER — Ambulatory Visit (INDEPENDENT_AMBULATORY_CARE_PROVIDER_SITE_OTHER): Payer: 59 | Admitting: Cardiology

## 2012-08-02 ENCOUNTER — Encounter: Payer: Self-pay | Admitting: *Deleted

## 2012-08-02 ENCOUNTER — Encounter: Payer: Self-pay | Admitting: Cardiology

## 2012-08-02 VITALS — BP 104/58 | HR 56 | Ht 72.0 in | Wt 326.0 lb

## 2012-08-02 DIAGNOSIS — Z7901 Long term (current) use of anticoagulants: Secondary | ICD-10-CM | POA: Insufficient documentation

## 2012-08-02 DIAGNOSIS — E785 Hyperlipidemia, unspecified: Secondary | ICD-10-CM | POA: Insufficient documentation

## 2012-08-02 DIAGNOSIS — Z5181 Encounter for therapeutic drug level monitoring: Secondary | ICD-10-CM

## 2012-08-02 DIAGNOSIS — I4891 Unspecified atrial fibrillation: Secondary | ICD-10-CM

## 2012-08-02 DIAGNOSIS — Z79899 Other long term (current) drug therapy: Secondary | ICD-10-CM

## 2012-08-02 DIAGNOSIS — Z9189 Other specified personal risk factors, not elsewhere classified: Secondary | ICD-10-CM

## 2012-08-02 DIAGNOSIS — Z9289 Personal history of other medical treatment: Secondary | ICD-10-CM

## 2012-08-02 NOTE — Progress Notes (Deleted)
Name: Brandon Torres    DOB: 04-Apr-1966  Age: 46 y.o.  MR#: [id number]       PCP:  Fredirick Maudlin, MD      Insurance: @PAYORNAME @   CC:    Chief Complaint  Patient presents with  . Follow-up    Low blood pressure, PVC    VS BP 104/58  Pulse 56  Ht 6' (1.829 m)  Wt 235 lb (106.595 kg)  BMI 31.87 kg/m2  SpO2 96%  Weights Current Weight  08/02/12 235 lb (106.595 kg)  01/03/12 307 lb (139.254 kg)  01/11/11 299 lb (135.626 kg)    Blood Pressure  BP Readings from Last 3 Encounters:  08/02/12 104/58  01/03/12 116/69  01/11/11 101/57     Admit date:  (Not on file) Last encounter with RMR:  Visit date not found   Allergy No Known Allergies  Current Outpatient Prescriptions  Medication Sig Dispense Refill  . amiodarone (PACERONE) 200 MG tablet Take 200 mg by mouth 2 (two) times daily.      Marland Kitchen dofetilide (TIKOSYN) 500 MCG capsule Take 500 mcg by mouth 2 (two) times daily.        . magnesium oxide (MAG-OX) 400 MG tablet Take 400 mg by mouth every other day.      . metoprolol tartrate (LOPRESSOR) 25 MG tablet Take 25 mg by mouth daily. 1/2 tablet , 12.5mg  daily      . Rivaroxaban (XARELTO) 20 MG TABS Take 20 mg by mouth daily.        Discontinued Meds:    Medications Discontinued During This Encounter  Medication Reason  . aspirin 81 MG tablet Error  . lisinopril (PRINIVIL,ZESTRIL) 5 MG tablet Error  . magnesium oxide (MAG-OX) 400 MG tablet     Patient Active Problem List  Diagnosis  . Atrial fibrillation  . Chronic anticoagulation  . Hyperlipidemia    LABS No visits with results within 3 Month(s) from this visit. Latest known visit with results is:  Office Visit on 01/03/2012  Component Date Value  . Cholesterol 01/04/2012 192   . Triglycerides 01/04/2012 66   . HDL 01/04/2012 46   . Total CHOL/HDL Ratio 01/04/2012 4.2   . VLDL 01/04/2012 13   . LDL Cholesterol 01/04/2012 133*  . Sodium 01/04/2012 139   . Potassium 01/04/2012 4.7   . Chloride  01/04/2012 103   . CO2 01/04/2012 27   . Glucose, Bld 01/04/2012 91   . BUN 01/04/2012 15   . Creat 01/04/2012 0.84   . Calcium 01/04/2012 9.3   . Total Bilirubin 01/03/2012 0.5   . Bilirubin, Direct 01/03/2012 0.1   . Indirect Bilirubin 01/03/2012 0.4   . Alkaline Phosphatase 01/03/2012 78   . AST 01/03/2012 20   . ALT 01/03/2012 21   . Total Protein 01/03/2012 6.6   . Albumin 01/03/2012 4.2      Results for this Opt Visit:     Results for orders placed in visit on 01/03/12  LIPID PANEL      Component Value Range   Cholesterol 192  0 - 200 mg/dL   Triglycerides 66  <284 mg/dL   HDL 46  >13 mg/dL   Total CHOL/HDL Ratio 4.2     VLDL 13  0 - 40 mg/dL   LDL Cholesterol 244 (*) 0 - 99 mg/dL  BASIC METABOLIC PANEL      Component Value Range   Sodium 139  135 - 145 mEq/L   Potassium 4.7  3.5 - 5.3 mEq/L   Chloride 103  96 - 112 mEq/L   CO2 27  19 - 32 mEq/L   Glucose, Bld 91  70 - 99 mg/dL   BUN 15  6 - 23 mg/dL   Creat 8.11  9.14 - 7.82 mg/dL   Calcium 9.3  8.4 - 95.6 mg/dL  HEPATIC FUNCTION PANEL      Component Value Range   Total Bilirubin 0.5  0.3 - 1.2 mg/dL   Bilirubin, Direct 0.1  0.0 - 0.3 mg/dL   Indirect Bilirubin 0.4  0.0 - 0.9 mg/dL   Alkaline Phosphatase 78  39 - 117 U/L   AST 20  0 - 37 U/L   ALT 21  0 - 53 U/L   Total Protein 6.6  6.0 - 8.3 g/dL   Albumin 4.2  3.5 - 5.2 g/dL    EKG Orders placed in visit on 01/03/12  . EKG 12-LEAD     Prior Assessment and Plan Problem List as of 08/02/2012            Cardiology Problems   Atrial fibrillation   Last Assessment & Plan Note   01/03/2012 Office Visit Signed 01/03/2012  1:28 PM by Jodelle Gross, NP    He is doing very well. No complaints. Medically compliant. He is seeing [facility] every 6 months as well. Will have BMET, fasting lipids and LFT's completed. Records will be sent to [facility] on results. Will refill his magnesium dose.    Hyperlipidemia     Other   Chronic anticoagulation        Imaging: No results found.   FRS Calculation: Score not calculated. Missing: Total Cholesterol

## 2012-08-02 NOTE — Patient Instructions (Addendum)
Your physician recommends that you schedule a follow-up appointment in: 2 months  Your physician has requested that you have an echocardiogram. Echocardiography is a painless test that uses sound waves to create images of your heart. It provides your doctor with information about the size and shape of your heart and how well your heart's chambers and valves are working. This procedure takes approximately one hour. There are no restrictions for this procedure.  Your physician recommends that you return for lab work in: 5 weeks  Your physician has recommended you make the following change in your medication:  1 - STOP Metoprolol 2 - STOP Magnesium

## 2012-08-03 DIAGNOSIS — Z9289 Personal history of other medical treatment: Secondary | ICD-10-CM | POA: Insufficient documentation

## 2012-08-03 NOTE — Progress Notes (Signed)
Patient ID: Brandon Torres, male   DOB: 07-06-1966, 46 y.o.   MRN: [id number]  HPI: Scheduled return visit for this very nice gentleman who has undergone radiofrequency ablation of atrial fibrillation on 2 occasions at [facility], initially in 2009 and subsequently just a few weeks ago.  He previously was treated with dofetilide, but amiodarone has been substituted since his recent hospital admission.  He is tolerating that medicine well so far with no apparent adverse effects.  Daily dosage has been tapered from 800 mg to 400 mg with plans for a subsequent decrease to 200 mg.  He has noted no palpitations nor pulse irregularity since his recent electrophysiology procedure.  Echocardiogram in 2009 revealed an EF of 50%; however, patient reports recent EF of 25%, possibly representing a tachycardia mediated cardiomyopathy.  Recent records from [facility] are pending.    Prior to Admission medications   Medication Sig Start Date End Date Taking? Authorizing Provider  amiodarone (PACERONE) 200 MG tablet Take 200 mg by mouth 2 (two) times daily.   Yes Historical Provider, MD  dofetilide (TIKOSYN) 500 MCG capsule Take 500 mcg by mouth 2 (two) times daily.     Yes Historical Provider, MD  magnesium oxide (MAG-OX) 400 MG tablet Take 400 mg by mouth every other day. 01/03/12  Yes Jodelle Gross, NP  metoprolol tartrate (LOPRESSOR) 25 MG tablet Take 25 mg by mouth daily. 1/2 tablet , 12.5mg  daily   Yes Historical Provider, MD  Rivaroxaban (XARELTO) 20 MG TABS Take 20 mg by mouth daily.   Yes Historical Provider, MD  No Known Allergies    Past medical history, social history, and family history reviewed and updated.  ROS: Denies orthopnea, PND, leg pain or swelling, dyspnea, palpitations, lightheadedness or syncope.  All other systems reviewed and are negative.  PHYSICAL EXAM: BP 104/58  Pulse 56  Ht 6' (1.829 m)  Wt 147.873 kg (326 lb)  BMI 44.21 kg/m2  SpO2 96%  General-Well developed; no acute distress Body  habitus-proportionate weight and height Neck-No JVD; no carotid bruits Lungs-clear lung fields; resonant to percussion Cardiovascular-normal PMI; normal S1 and S2 Abdomen-normal bowel sounds; soft and non-tender without masses or organomegaly Musculoskeletal-No deformities, no cyanosis or clubbing Neurologic-Normal cranial nerves; symmetric strength and tone Skin-Warm, no significant lesions Extremities-distal pulses intact  EKG:  Sinus bradycardia rate of 55 bpm; low-voltage; first degree AV block; delayed R-wave progression-cannot exclude previous anteroseptal MI; minor nonspecific ST-T wave abnormality  ASSESSMENT AND PLAN:  Dorchester Bing, MD 08/03/2012 7:58 PM

## 2012-08-03 NOTE — Assessment & Plan Note (Signed)
Average lipid levels.  In the absence of high risk or known vascular disease, pharmacologic therapy is not necessary.

## 2012-08-03 NOTE — Assessment & Plan Note (Addendum)
Patient is doing well following a second attempt to cure his atrial fibrillation.  Although EF was quite low at [facility], patient has no clinical signs nor symptoms for congestive heart failure.  An echocardiogram will be repeated in a few weeks to determine if cardiomyopathy has reversed with control of his tachycardia.  Anticoagulation will be maintained.  The planned duration of amiodarone therapy is unclear, but I suspect this is not meant to be permanent.  We have requested [facility] records and information regarding their future plans.  Subtherapeutic dose of metoprolol should not be necessary and will be discontinued.  Magnesium therapy was provided to increase the safety of dofetilide therapy.  This medication will be discontinued as well.

## 2012-08-03 NOTE — Assessment & Plan Note (Addendum)
Patient is doing well on rivaroxaban.  If LV systolic function recovers, his risk for thromboembolism will be low.  Aspirin will likely provide adequate antithrombotic therapy in the long term, but full anticoagulation will be maintained for now.

## 2012-08-25 ENCOUNTER — Encounter: Payer: Self-pay | Admitting: Cardiology

## 2012-09-01 ENCOUNTER — Other Ambulatory Visit: Payer: Self-pay | Admitting: *Deleted

## 2012-09-01 DIAGNOSIS — I4891 Unspecified atrial fibrillation: Secondary | ICD-10-CM

## 2012-09-01 DIAGNOSIS — Z5181 Encounter for therapeutic drug level monitoring: Secondary | ICD-10-CM

## 2012-09-01 DIAGNOSIS — Z79899 Other long term (current) drug therapy: Secondary | ICD-10-CM

## 2012-09-05 ENCOUNTER — Ambulatory Visit (HOSPITAL_COMMUNITY)
Admission: RE | Admit: 2012-09-05 | Discharge: 2012-09-05 | Disposition: A | Discharge status: Home / Self Care | Payer: 59 | Source: Ambulatory Visit | Attending: Cardiology | Admitting: Cardiology

## 2012-09-05 ENCOUNTER — Encounter: Payer: Self-pay | Admitting: Cardiology

## 2012-09-05 DIAGNOSIS — Z79899 Other long term (current) drug therapy: Secondary | ICD-10-CM | POA: Insufficient documentation

## 2012-09-05 DIAGNOSIS — Z7901 Long term (current) use of anticoagulants: Secondary | ICD-10-CM | POA: Insufficient documentation

## 2012-09-05 DIAGNOSIS — I4891 Unspecified atrial fibrillation: Secondary | ICD-10-CM | POA: Insufficient documentation

## 2012-09-05 LAB — COMPREHENSIVE METABOLIC PANEL
ALT: 19 U/L (ref 0–53)
AST: 18 U/L (ref 0–37)
Albumin: 4.3 g/dL (ref 3.5–5.2)
Alkaline Phosphatase: 64 U/L (ref 39–117)
BUN: 19 mg/dL (ref 6–23)
CO2: 25 mEq/L (ref 19–32)
Calcium: 9.5 mg/dL (ref 8.4–10.5)
Chloride: 106 mEq/L (ref 96–112)
Creat: 0.91 mg/dL (ref 0.50–1.35)
Glucose, Bld: 90 mg/dL (ref 70–99)
Potassium: 4.3 mEq/L (ref 3.5–5.3)
Sodium: 140 mEq/L (ref 135–145)
Total Bilirubin: 0.6 mg/dL (ref 0.3–1.2)
Total Protein: 6.6 g/dL (ref 6.0–8.3)

## 2012-09-05 LAB — TSH: TSH: 2.585 u[IU]/mL (ref 0.350–4.500)

## 2012-09-05 NOTE — Progress Notes (Signed)
*  PRELIMINARY RESULTS* Echocardiogram 2D Echocardiogram has been performed.  Brandon Torres 09/05/2012, 9:48 AM

## 2012-10-10 ENCOUNTER — Ambulatory Visit: Payer: 59 | Admitting: Cardiology

## 2012-11-02 ENCOUNTER — Ambulatory Visit: Payer: 59 | Admitting: Cardiology

## 2012-12-15 ENCOUNTER — Telehealth: Payer: Self-pay | Admitting: Cardiology

## 2012-12-15 NOTE — Telephone Encounter (Signed)
Patient called about event monitor from Dr. Juanetta Gosling office.  Please call patient cell number.

## 2012-12-20 ENCOUNTER — Encounter (INDEPENDENT_AMBULATORY_CARE_PROVIDER_SITE_OTHER): Payer: 59

## 2012-12-20 ENCOUNTER — Other Ambulatory Visit: Payer: Self-pay | Admitting: *Deleted

## 2012-12-20 DIAGNOSIS — I4891 Unspecified atrial fibrillation: Secondary | ICD-10-CM

## 2013-01-01 ENCOUNTER — Other Ambulatory Visit: Payer: Self-pay | Admitting: Pulmonary Disease

## 2013-01-01 DIAGNOSIS — I4891 Unspecified atrial fibrillation: Secondary | ICD-10-CM

## 2013-01-30 ENCOUNTER — Encounter: Payer: Self-pay | Admitting: Cardiology

## 2013-02-16 ENCOUNTER — Encounter: Payer: Self-pay | Admitting: Adult Health

## 2013-02-16 ENCOUNTER — Ambulatory Visit (INDEPENDENT_AMBULATORY_CARE_PROVIDER_SITE_OTHER): Payer: 59 | Admitting: Adult Health

## 2013-02-16 VITALS — BP 114/74 | HR 44 | Ht 72.0 in | Wt 328.8 lb

## 2013-02-16 DIAGNOSIS — I10 Essential (primary) hypertension: Secondary | ICD-10-CM

## 2013-02-16 DIAGNOSIS — Z7901 Long term (current) use of anticoagulants: Secondary | ICD-10-CM

## 2013-02-16 DIAGNOSIS — I4891 Unspecified atrial fibrillation: Secondary | ICD-10-CM

## 2013-02-16 NOTE — Patient Instructions (Addendum)
Your physician recommends that you schedule a follow-up appointment in: KEEP APT WITH [facility] PUTT [facility] 02-19-13 AT 9AM, DR Ladona Ridgel TO DETERMINE IF PT WILL NEED TO BE ADMITTED POST APT  Your physician recommends that you return for lab work in: TODAY (AMIODERONE LEVEL, BMET)

## 2013-02-16 NOTE — Progress Notes (Deleted)
Name: Brandon Torres    DOB: 29-Apr-1966  Age: 47 y.o.  MR#: [id number]       PCP:  Fredirick Maudlin, MD      Insurance: Payor: Cleatrice Burke  Plan: [place] [employer]  Product Type: *No Product type*    CC:    Chief Complaint  Patient presents with  . Atrial Fibrillation    S/P RFA   PT NOTES FLUTTERING TODAY, STARTED ON Tuesday AND DAILY SINCE THEN, FEELS THAT THIS HAS BEEN CONSTANT SINCE Tuesday, LEFT ARM SLIGHTLY PERIODICALLY NOTED ALSO STARTED TUESDAY VS Filed Vitals:   02/16/13 1419  BP: 114/74  Pulse: 44  Height: 6' (1.829 m)  Weight: 328 lb 12 oz (149.12 kg)    Weights Current Weight  02/16/13 328 lb 12 oz (149.12 kg)  08/02/12 326 lb (147.873 kg)  01/03/12 307 lb (139.254 kg)    Blood Pressure  BP Readings from Last 3 Encounters:  02/16/13 114/74  08/02/12 104/58  01/03/12 116/69     Admit date:  (Not on file) Last encounter with RMR:  Visit date not found   Allergy Review of patient's allergies indicates no known allergies.  Current Outpatient Prescriptions  Medication Sig Dispense Refill  . Rivaroxaban (XARELTO) 20 MG TABS Take 20 mg by mouth daily.       No current facility-administered medications for this visit.    Discontinued Meds:    Medications Discontinued During This Encounter  Medication Reason  . amiodarone (PACERONE) 200 MG tablet Error  . dofetilide (TIKOSYN) 500 MCG capsule Error  . metoprolol tartrate (LOPRESSOR) 25 MG tablet Error  . magnesium oxide (MAG-OX) 400 MG tablet Error    Patient Active Problem List  Diagnosis  . Atrial fibrillation  . Chronic anticoagulation  . Hyperlipidemia  . History of diagnostic tests    LABS    Component Value Date/Time   NA 140 09/01/2012 1610   NA 139 01/04/2012 1320   NA 138 01/15/2011 0903   K 4.3 09/01/2012 1610   K 4.7 01/04/2012 1320   K 4.3 01/15/2011 0903   CL 106 09/01/2012 1610   CL 103 01/04/2012 1320   CL 103 01/15/2011 0903   CO2 25 09/01/2012 1610   CO2 27 01/04/2012 1320   CO2 26 01/15/2011 0903   GLUCOSE 90 09/01/2012 1610   GLUCOSE 91 01/04/2012 1320   GLUCOSE 95 01/15/2011 0903   BUN 19 09/01/2012 1610   BUN 15 01/04/2012 1320   BUN 15 01/15/2011 0903   CREATININE 0.91 09/01/2012 1610   CREATININE 0.84 01/04/2012 1320   CREATININE 0.85 01/15/2011 0903   CREATININE 0.94 04/10/2010 2320   CREATININE 0.94 04/10/2010   CREATININE 0.80 06/10/2008   CALCIUM 9.5 09/01/2012 1610   CALCIUM 9.3 01/04/2012 1320   CALCIUM 9.3 01/15/2011 0903   CMP     Component Value Date/Time   NA 140 09/01/2012 1610   K 4.3 09/01/2012 1610   CL 106 09/01/2012 1610   CO2 25 09/01/2012 1610   GLUCOSE 90 09/01/2012 1610   BUN 19 09/01/2012 1610   CREATININE 0.91 09/01/2012 1610   CREATININE 0.94 04/10/2010 2320   CALCIUM 9.5 09/01/2012 1610   PROT 6.6 09/01/2012 1610   ALBUMIN 4.3 09/01/2012 1610   AST 18 09/01/2012 1610   ALT 19 09/01/2012 1610   ALKPHOS 64 09/01/2012 1610   BILITOT 0.6 09/01/2012 1610    No results found for this basename: wbc, hgb, hct, mcv, platelets    Lipid Panel  Component Value Date/Time   CHOL 192 01/04/2012 1320   TRIG 66 01/04/2012 1320   HDL 46 01/04/2012 1320   CHOLHDL 4.2 01/04/2012 1320   VLDL 13 01/04/2012 1320   LDLCALC 133* 01/04/2012 1320    ABG No results found for this basename: phart, pco2, pco2art, po2, po2art, hco3, tco2, acidbasedef, o2sat     Lab Results  Component Value Date   TSH 2.585 09/01/2012   BNP (last 3 results) No results found for this basename: PROBNP,  in the last 8760 hours Cardiac Panel (last 3 results) No results found for this basename: CKTOTAL, CKMB, TROPONINI, RELINDX,  in the last 72 hours  Iron/TIBC/Ferritin No results found for this basename: iron, tibc, ferritin     EKG Orders placed in visit on 02/16/13  . EKG 12-LEAD     Prior Assessment and Plan Problem List as of 02/16/2013     ICD-9-CM   Atrial fibrillation   Last Assessment & Plan   08/02/2012 Office Visit Edited 08/03/2012  8:13 PM by Kathlen Brunswick, MD     Patient is doing well following a second attempt to cure his atrial fibrillation.  Although EF was quite low at [facility], patient has no clinical signs nor symptoms for congestive heart failure.  An echocardiogram will be repeated in a few weeks to determine if cardiomyopathy has reversed with control of his tachycardia.  Anticoagulation will be maintained.  The planned duration of amiodarone therapy is unclear, but I suspect this is not meant to be permanent.  We have requested [facility] records and information regarding their future plans.  Subtherapeutic dose of metoprolol should not be necessary and will be discontinued.  Magnesium therapy was provided to increase the safety of dofetilide therapy.  This medication will be discontinued as well.    Chronic anticoagulation   Last Assessment & Plan   08/02/2012 Office Visit Edited 08/07/2012 10:48 AM by Kathlen Brunswick, MD     Patient is doing well on rivaroxaban.  If LV systolic function recovers, his risk for thromboembolism will be low.  Aspirin will likely provide adequate antithrombotic therapy in the long term, but full anticoagulation will be maintained for now.    Hyperlipidemia   Last Assessment & Plan   08/02/2012 Office Visit Written 08/03/2012  8:12 PM by Kathlen Brunswick, MD     Average lipid levels.  In the absence of high risk or known vascular disease, pharmacologic therapy is not necessary.    History of diagnostic tests       Imaging: No results found.

## 2013-02-16 NOTE — Assessment & Plan Note (Signed)
I have spoken with both Dr. Dietrich Pates and with Dr. Ileana Ladd concerning his recurrence of atrial fib. It is Dr. Cleda Mccreedy recommendation that the patient be restarted on tikosyn. I have explained that he will need to be admitted for loading. The patient is aware of the need to do so and wishes to remain at [facility] for this. He will have Amiodarone level and BMET. He will be seen by Orlinda Blalock D. At 9am to review of labs and admission orders. This has been explained to the patient who verbalizes understanding.

## 2013-02-16 NOTE — Assessment & Plan Note (Signed)
Remain on Xarelto until further notice.
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
## 2013-02-17 LAB — BASIC METABOLIC PANEL
BUN: 12 mg/dL (ref 6–23)
CO2: 28 mEq/L (ref 19–32)
Calcium: 9.1 mg/dL (ref 8.4–10.5)
Chloride: 103 mEq/L (ref 96–112)
Creat: 0.97 mg/dL (ref 0.50–1.35)
Glucose, Bld: 87 mg/dL (ref 70–99)
Potassium: 4.3 mEq/L (ref 3.5–5.3)
Sodium: 137 mEq/L (ref 135–145)

## 2013-02-19 ENCOUNTER — Encounter (HOSPITAL_COMMUNITY): Payer: Self-pay | Admitting: Cardiology

## 2013-02-19 ENCOUNTER — Ambulatory Visit (INDEPENDENT_AMBULATORY_CARE_PROVIDER_SITE_OTHER): Payer: 59 | Admitting: Pharmacist

## 2013-02-19 ENCOUNTER — Inpatient Hospital Stay (HOSPITAL_COMMUNITY)
Admission: AD | Admit: 2013-02-19 | Discharge: 2013-02-22 | DRG: 309 | Disposition: A | Discharge status: Home / Self Care | Payer: 59 | Source: Ambulatory Visit | Attending: Internal Medicine | Admitting: Internal Medicine

## 2013-02-19 VITALS — Ht 72.0 in | Wt 321.8 lb

## 2013-02-19 DIAGNOSIS — I4891 Unspecified atrial fibrillation: Secondary | ICD-10-CM

## 2013-02-19 DIAGNOSIS — Z7901 Long term (current) use of anticoagulants: Secondary | ICD-10-CM

## 2013-02-19 DIAGNOSIS — I428 Other cardiomyopathies: Secondary | ICD-10-CM | POA: Diagnosis present

## 2013-02-19 DIAGNOSIS — E785 Hyperlipidemia, unspecified: Secondary | ICD-10-CM | POA: Diagnosis present

## 2013-02-19 DIAGNOSIS — E669 Obesity, unspecified: Secondary | ICD-10-CM | POA: Diagnosis present

## 2013-02-19 DIAGNOSIS — Z6841 Body Mass Index (BMI) 40.0 and over, adult: Secondary | ICD-10-CM

## 2013-02-19 LAB — BASIC METABOLIC PANEL
BUN: 14 mg/dL (ref 6–23)
BUN: 15 mg/dL (ref 6–23)
CO2: 30 mEq/L (ref 19–32)
CO2: 26 mEq/L (ref 19–32)
Calcium: 9.1 mg/dL (ref 8.4–10.5)
Calcium: 9.3 mg/dL (ref 8.4–10.5)
Chloride: 104 mEq/L (ref 96–112)
Chloride: 103 mEq/L (ref 96–112)
Creatinine, Ser: 1 mg/dL (ref 0.4–1.5)
Creatinine, Ser: 0.94 mg/dL (ref 0.50–1.35)
GFR calc Af Amer: >90 mL/min (ref >90)
GFR calc non Af Amer: >90 mL/min (ref >90)
GFR: 88.85 mL/min (ref >60.00)
Glucose, Bld: 84 mg/dL (ref 70–99)
Glucose, Bld: 106 mg/dL — ABNORMAL HIGH (ref 70–99)
Potassium: 4.1 mEq/L (ref 3.5–5.1)
Potassium: 4.8 mEq/L (ref 3.5–5.1)
Sodium: 138 mEq/L (ref 135–145)
Sodium: 139 mEq/L (ref 135–145)

## 2013-02-19 LAB — MAGNESIUM
Magnesium: 1.8 mg/dL (ref 1.5–2.5)
Magnesium: 2 mg/dL (ref 1.5–2.5)

## 2013-02-19 MED ORDER — ASPIRIN 300 MG RE SUPP
300.0000 mg | RECTAL | Status: AC
  Filled 2013-02-19: qty 1

## 2013-02-19 MED ORDER — SODIUM CHLORIDE 0.9 % IJ SOLN: 3.0000 mL | INTRAMUSCULAR | Status: DC | PRN

## 2013-02-19 MED ORDER — RIVAROXABAN 20 MG PO TABS
20.0000 mg | ORAL_TABLET | Freq: Every day | ORAL | Status: DC
  Administered 2013-02-19 – 2013-02-21 (×3): 20 mg via ORAL
  Filled 2013-02-19 (×4): qty 1

## 2013-02-19 MED ORDER — SODIUM CHLORIDE 0.9 % IJ SOLN
3.0000 mL | Freq: Two times a day (BID) | INTRAMUSCULAR | Status: DC
  Administered 2013-02-19 – 2013-02-21 (×4): 3 mL via INTRAVENOUS

## 2013-02-19 MED ORDER — ASPIRIN EC 81 MG PO TBEC
81.0000 mg | DELAYED_RELEASE_TABLET | Freq: Every day | ORAL | Status: DC
  Administered 2013-02-20 – 2013-02-22 (×3): 81 mg via ORAL
  Filled 2013-02-19 (×3): qty 1

## 2013-02-19 MED ORDER — NITROGLYCERIN 0.4 MG SL SUBL: 0.4000 mg | SUBLINGUAL_TABLET | SUBLINGUAL | Status: DC | PRN

## 2013-02-19 MED ORDER — DOFETILIDE 500 MCG PO CAPS
500.0000 ug | ORAL_CAPSULE | Freq: Two times a day (BID) | ORAL | Status: DC
  Administered 2013-02-19 – 2013-02-22 (×6): 500 ug via ORAL
  Filled 2013-02-19 (×7): qty 1

## 2013-02-19 MED ORDER — SODIUM CHLORIDE 0.9 % IV SOLN: 250.0000 mL | INTRAVENOUS | Status: DC | PRN

## 2013-02-19 MED ORDER — ACETAMINOPHEN 325 MG PO TABS: 650.0000 mg | ORAL_TABLET | ORAL | Status: DC | PRN

## 2013-02-19 MED ORDER — ASPIRIN 81 MG PO CHEW
324.0000 mg | CHEWABLE_TABLET | ORAL | Status: AC
  Administered 2013-02-19: 324 mg via ORAL
  Filled 2013-02-19: qty 4

## 2013-02-19 MED ORDER — ONDANSETRON HCL 4 MG/2ML IJ SOLN: 4.0000 mg | Freq: Four times a day (QID) | INTRAMUSCULAR | Status: DC | PRN

## 2013-02-19 NOTE — Assessment & Plan Note (Signed)
Reviewed pt's lab.  K- 4.1, Mg- 1.8, amiodarone level- pending.  SCr- 1.0; CrCl>114mL/min. QTc - 440 msec  Discussed with Dr. Ladona Ridgel.  Pt has been off amiodarone > 3 months so will proceed with Tikosyn despite no amiodarone level result available.  Pt thinks he took BID in the past, but based on CrCl, should start with BID and then adjust based on QTc.  Pt aware to report to hospital.
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
## 2013-02-19 NOTE — Progress Notes (Signed)
HPI  [name] is a 47 yo M patient of Dr. Marvel Plan who is seen for Tikosyn initiation.  He is followed by Dr. Ileana Ladd at [facility]. PMH significant for atrial fibrillation- status post RFA x 2.  He has tried several antiarrhythmics in the past, including Tikosyn and amidodarone.  He was most recently on amiodarone but this was stopped by Dr. Ileana Ladd on 10/30/12.  He had a holter monitor placed in March which showed normal sinus with frequent PVCs and one episode of vtach.  He stated he has been experiencing more episodes over the past few weeks and saw the NP on 4/25.  [name] discussed pt with Dr. Dietrich Pates and Dr. Ileana Ladd.  It was decided to retry Tikosyn at this time.   Reviewed pt's medication list.  He is only taking Xarelto at this time.  He reports compliance with this medication and not missing any doses in the past month.  He stopped amiodarone on 10/30/12.  An amiodarone level was drawn on 02/16/13 but the results are not available yet.  Pt thinks he took of Tikosyn in the past.   Pt and wife re-educated on Tikosyn.  He is aware of the importance of compliance with this medication as well as potential side effects.    EKG reviewed by Dr. Johney Frame: Afib with ventricular rate of 87.  QTc- 440 msec.   Current Outpatient Prescriptions  Medication Sig Dispense Refill  . Rivaroxaban (XARELTO) 20 MG TABS Take 20 mg by mouth daily.       No current facility-administered medications for this visit.   No Known Allergies

## 2013-02-19 NOTE — Progress Notes (Signed)
Spoke with PA to make aware of pt's arrival; no admission orders at this time; pt alert and oriented; wife at bedside; pt in with a-fib to load on Tikosyn; pt denies pain; IV access obtained; tele monitor in place; will cont. To monitor.

## 2013-02-20 LAB — BASIC METABOLIC PANEL
BUN: 14 mg/dL (ref 6–23)
CO2: 26 mEq/L (ref 19–32)
Calcium: 9.2 mg/dL (ref 8.4–10.5)
Chloride: 104 mEq/L (ref 96–112)
Creatinine, Ser: 0.87 mg/dL (ref 0.50–1.35)
GFR calc Af Amer: >90 mL/min (ref >90)
GFR calc non Af Amer: >90 mL/min (ref >90)
Glucose, Bld: 102 mg/dL — ABNORMAL HIGH (ref 70–99)
Potassium: 4.1 mEq/L (ref 3.5–5.1)
Sodium: 138 mEq/L (ref 135–145)

## 2013-02-20 LAB — MAGNESIUM: Magnesium: 2 mg/dL (ref 1.5–2.5)

## 2013-02-20 NOTE — Progress Notes (Signed)
Patient ID: Brandon Torres, male   DOB: 04/04/1966, 47 y.o.   MRN: [id number] Subjective:  No chest pain or sob. "I usually go back to rhythm after one or two doses of Tikosyn"  Objective:  Vital Signs in the last 24 hours: Temp:  [98.2 F (36.8 C)-98.3 F (36.8 C)] 98.2 F (36.8 C) (04/29 0606) Pulse Rate:  [63-97] 63 (04/29 0606) Resp:  [16] 16 (04/29 0606) BP: (105-131)/(69-76) 105/69 mmHg (04/29 0606) SpO2:  [96 %-100 %] 100 % (04/29 0606) Weight:  [320 lb 8.8 oz (145.4 kg)-321 lb 12 oz (145.945 kg)] 320 lb 8.8 oz (145.4 kg) (04/28 1659)  Intake/Output from previous day: 04/28 0701 - 04/29 0700 In: -  Out: 800 [Urine:800] Intake/Output from this shift: Total I/O In: 240 [P.O.:240] Out: -   Physical Exam: Well appearing obese, middle aged man, NAD HEENT: Unremarkable Neck:  7 cm JVD, no thyromegally Lungs:  Clear with no wheezes HEART:  Regular rate rhythm, no murmurs, no rubs, no clicks Abd:  obese, positive bowel sounds, no organomegally, no rebound, no guarding Ext:  2 plus pulses, no edema, no cyanosis, no clubbing Skin:  No rashes no nodules Neuro:  CN II through XII intact, motor grossly intact  Lab Results: No results found for this basename: WBC, HGB, PLT,  in the last 72 hours  Recent Labs  02/19/13 2005 02/20/13 0415  NA 139 138  K 4.8 4.1  CL 103 104  CO2 26 26  GLUCOSE 106* 102*  BUN 15 14  CREATININE 0.94 0.87   No results found for this basename: TROPONINI, CK, MB,  in the last 72 hours Hepatic Function Panel No results found for this basename: PROT, ALBUMIN, AST, ALT, ALKPHOS, BILITOT, BILIDIR, IBILI,  in the last 72 hours No results found for this basename: CHOL,  in the last 72 hours No results found for this basename: PROTIME,  in the last 72 hours  Imaging: No results found.  Cardiac Studies: Tele - atrial fib with a controlled VR. QT 450 Assessment/Plan:  1. Atrial fibrillation 2. Admit for Tikosyn therapy  Plan - DCCV tomorrow  if he is not back to nsr, continue Tikosyn  LOS: 1 day    Lewayne Bunting, M.D. 02/20/2013, 9:28 AM

## 2013-02-20 NOTE — Progress Notes (Signed)
Utilization Review Completed.Brandon Torres T4/29/2014  

## 2013-02-20 NOTE — Care Management Note (Signed)
    Page 1 of 2   02/22/2013     3:26:26 PM   CARE MANAGEMENT NOTE 02/22/2013  Patient:  Brandon Torres, Brandon Torres   Account Number:  [id number]  Date Initiated:  02/20/2013  Documentation initiated by:  Jalonda Antigua  Subjective/Objective Assessment:   PT ADM ON 4/28 WITH AFIB FOR TIKOSYN LOAD.  PTA, PT INDEPENDENT, LIVES WITH SPOUSE.     Action/Plan:   WILL ASSIST PT WITH OBTAINING TIKOSYN UPON DC.  WILL NEED 2 RX FOR TIKOSYN UPON DC; ONE WITH REFILLS AND ONE FOR A WEEK'S SUPPLY TO BE FILLED BY MAIN PHARMACY PRIOR TO DC.   Anticipated DC Date:  02/22/2013   Anticipated DC Plan:  HOME/SELF CARE      DC Planning Services  CM consult  Medication Assistance      Choice offered to / List presented to:             Status of service:  Completed, signed off Medicare Important Message given?   (If response is "NO", the following Medicare IM given date fields will be blank) Date Medicare IM given:   Date Additional Medicare IM given:    Discharge Disposition:  HOME/SELF CARE  Per UR Regulation:  Reviewed for med. necessity/level of care/duration of stay  If discussed at Long Length of Stay Meetings, dates discussed:    Comments:  02/22/13 Kel Senn,RN,BSN [phone] PT RECEIVED ONE WEEK SUPPLY OF TIKOSYN PRIOR TO DC HOME.  02/21/13 Ysabel Cowgill,RN,BSN [phone] MET WITH PT TO DISCUSS OBTAINING TIKOSYN POST-DC.  PT STATES HE HAS BEEN ON TIKOSYN IN THE PAST, AND KNOWS HE CAN GET IT AT [facility] IN [place].  HE STATES THEY HAD TO ORDER FIRST RX PREVIOUSLY.  WILL PROVIDE PT WITH ONE WEEK SUPPLY OF TIKOSYN PRIOR TO DC, TO ALLOW TIME FOR MED TO BE ORDERED. MD WILL NEED TO LEAVE 2 RX FOR TIKOSYN AT DC:  ONE WITH REFILLS, AND ONE FOR A WEEK'S SUPPLY.  WILL FOLLOW UP.

## 2013-02-21 LAB — BASIC METABOLIC PANEL
BUN: 16 mg/dL (ref 6–23)
CO2: 26 mEq/L (ref 19–32)
Calcium: 9.3 mg/dL (ref 8.4–10.5)
Chloride: 104 mEq/L (ref 96–112)
Creatinine, Ser: 0.95 mg/dL (ref 0.50–1.35)
GFR calc Af Amer: >90 mL/min (ref >90)
GFR calc non Af Amer: >90 mL/min (ref >90)
Glucose, Bld: 94 mg/dL (ref 70–99)
Potassium: 3.8 mEq/L (ref 3.5–5.1)
Sodium: 138 mEq/L (ref 135–145)

## 2013-02-21 LAB — AMIODARONE LEVEL
Amiodarone Lvl: 0.2 ug/mL — ABNORMAL LOW (ref 1.5–2.5)
Desethylamiodarone: 0.2 ug/mL — ABNORMAL LOW (ref 1.5–2.5)

## 2013-02-21 LAB — MAGNESIUM: Magnesium: 2.1 mg/dL (ref 1.5–2.5)

## 2013-02-21 MED ORDER — POTASSIUM CHLORIDE CRYS ER 20 MEQ PO TBCR
40.0000 meq | EXTENDED_RELEASE_TABLET | Freq: Once | ORAL | Status: AC
  Administered 2013-02-21: 40 meq via ORAL
  Filled 2013-02-21: qty 2

## 2013-02-21 NOTE — Progress Notes (Signed)
Patient ID: Brandon Torres, male   DOB: [birth year], 47 y.o.   MRN: [id number] Subjective:  " I feel better"  Objective:  Vital Signs in the last 24 hours: Temp:  [97.4 F (36.3 C)-98.3 F (36.8 C)] 97.4 F (36.3 C) (04/30 0410) Pulse Rate:  [59-68] 59 (04/30 0410) Resp:  [16-18] 18 (04/30 0410) BP: (106-112)/(58-75) 106/68 mmHg (04/30 0410) SpO2:  [97 %-100 %] 100 % (04/30 0410)  Intake/Output from previous day: 04/29 0701 - 04/30 0700 In: 480 [P.O.:480] Out: 850 [Urine:850] Intake/Output from this shift:    Physical Exam: Well appearing middle aged man, NAD HEENT: Unremarkable Neck:  7 cm JVD, no thyromegally Lungs:  Clear with no wheezes, rales, or rhonchi HEART:  Regular rate rhythm, no murmurs, no rubs, no clicks Abd:  soft, positive bowel sounds, no organomegally, no rebound, no guarding Ext:  2 plus pulses, no edema, no cyanosis, no clubbing Skin:  No rashes no nodules Neuro:  CN II through XII intact, motor grossly intact  Lab Results: No results found for this basename: WBC, HGB, PLT,  in the last 72 hours  Recent Labs  02/20/13 0415 02/21/13 0435  NA 138 138  K 4.1 3.8  CL 104 104  CO2 26 26  GLUCOSE 102* 94  BUN 14 16  CREATININE 0.87 0.95   No results found for this basename: TROPONINI, CK, MB,  in the last 72 hours Hepatic Function Panel No results found for this basename: PROT, ALBUMIN, AST, ALT, ALKPHOS, BILITOT, BILIDIR, IBILI,  in the last 72 hours No results found for this basename: CHOL,  in the last 72 hours No results found for this basename: PROTIME,  in the last 72 hours  Imaging: No results found.  Cardiac Studies: Tele - NSR with PVC's Assessment/Plan:  1. Atrial fib 2. S/p Tikosyn initiation, now back to NSR Rec: will observe on Tele another 24 hours with plans for discharge home tomorrow if no pro-arrhythmia.  LOS: 2 days    Danyael Alipio,M.D. 02/21/2013, 8:01 AM

## 2013-02-21 NOTE — Progress Notes (Signed)
02/21/2013 4:47 PM Nursing note Follow up QTC of 524 three hours after morning dose of Tikosyn called to Dr. Ladona Ridgel along with A.M potassium level of 3.8. Verbal orders received to continue ordered Tikosyn dose and to give 40 meq KCL Po x1. Orders enacted. Will continue to closely monitor patient.  Mrytle Bento, Blanchard Kelch

## 2013-02-22 LAB — BASIC METABOLIC PANEL
BUN: 15 mg/dL (ref 6–23)
CO2: 25 mEq/L (ref 19–32)
Calcium: 9.2 mg/dL (ref 8.4–10.5)
Chloride: 104 mEq/L (ref 96–112)
Creatinine, Ser: 0.84 mg/dL (ref 0.50–1.35)
GFR calc Af Amer: >90 mL/min (ref >90)
GFR calc non Af Amer: >90 mL/min (ref >90)
Glucose, Bld: 96 mg/dL (ref 70–99)
Potassium: 4 mEq/L (ref 3.5–5.1)
Sodium: 139 mEq/L (ref 135–145)

## 2013-02-22 LAB — MAGNESIUM: Magnesium: 2 mg/dL (ref 1.5–2.5)

## 2013-02-22 MED ORDER — DOFETILIDE 500 MCG PO CAPS: 500.0000 ug | ORAL_CAPSULE | Freq: Two times a day (BID) | ORAL | Status: DC

## 2013-02-22 NOTE — Discharge Instructions (Signed)
DO NOT start any new medications (over-the-counter or prescription) without notifying Weston Brass, PharmD / Dr. Lewayne Bunting.

## 2013-02-22 NOTE — Discharge Summary (Signed)
   ELECTROPHYSIOLOGY DISCHARGE SUMMARY    Patient ID: Brandon Torres,  MRN: [id number], DOB/AGE: [birth year] 47 y.o.  Admit date: 02/19/2013 Discharge date: 02/25/2013  Primary Care Physician: Kari Baars, MD Primary Cardiologist: Dietrich Pates, MD, Ileana Ladd, MD  Primary Discharge Diagnosis:  1. Symptomatic PAF  Secondary Discharge Diagnoses:  1. NICM, EF 40% by echo Dec 2013  Procedures This Admission:  None   History:  Mr. Saccente is a 47 year old man with PAF who has been followed by Dr. Dietrich Pates. He is also followed by Dr. Ileana Ladd at [facility]. He is status post RFA x 2. He has tried several antiarrhythmics in the past, including Tikosyn and amidodarone. He was most recently on amiodarone but this was stopped by Dr. Ileana Ladd on 10/30/2012. He has been experiencing more episodes over the past few weeks and was evaluated in the [place] office on 02/16/2013. [name], NP discussed Mr. Lacount's care with Dr. Dietrich Pates and Dr. Ileana Ladd. It was decided to retry Tikosyn at this time.   Hospital Course: Mr. Vonbargen was admitted to [facility] on 02/19/2013 for Tikosyn initiation per protocol. He is tolerating Tikosyn well. His K and Mg were repleted and remain stable. His serum Cr is stable. His QTc is stable. He remains in SR. He has been seen, examined and deemed stable for DC to home today by Dr. Lewayne Bunting. He will follow-up in our office in one week for repeat ECG, BMET and Mg level. He will follow-up with Dr. Ladona Ridgel in 4 weeks.   Discharge Vitals: Blood pressure 109/72, pulse 65, temperature 97.1 F (36.2 C), temperature source Oral, resp. rate 20, height 6\' 1"  (1.854 m), weight 320 lb 8.8 oz (145.4 kg), SpO2 98.00%.   Labs:   Recent Labs Lab 02/22/13 0450  NA 139  K 4.0  CL 104  CO2 25  BUN 15  CREATININE 0.84  CALCIUM 9.2  GLUCOSE 96    Disposition:  The patient is being discharged in stable condition.  Follow-up:     Follow-up Information   Follow up with [facility] [place] On 02/27/2013. (At 10:00 AM for 12-lead ECG and labs (BMET))    Contact information:   [facility] office [address] [place] [address] [phone]      Follow up with Lewayne Bunting, MD On 03/26/2013. (At 9:45 AM)    Contact information:   [facility] [place] office [address] [place] [address] [phone]     Discharge Medications:    Medication List    TAKE these medications       dofetilide 500 MCG capsule  Commonly known as:  TIKOSYN  Take 1 capsule (500 mcg total) by mouth every 12 (twelve) hours.     XARELTO 20 MG Tabs  Generic drug:  Rivaroxaban  Take 20 mg by mouth daily.       Duration of Discharge Encounter: Greater than 30 minutes including physician time.  Signed, Rick Duff, PA-C 02/25/2013, 7:16 AM

## 2013-02-22 NOTE — Progress Notes (Signed)
     Patient: Brandon Torres Date of Encounter: 02/22/2013, 8:02 AM Admit date: 02/19/2013     Subjective  Brandon Torres has no complaints. He denies CP, SOB or palpitations. He is eager to go home.   Objective  Physical Exam: Vitals: BP 109/72  Pulse 65  Temp(Src) 97.1 F (36.2 C) (Oral)  Resp 20  Ht 6\' 1"  (1.854 m)  Wt 320 lb 8.8 oz (145.4 kg)  BMI 42.3 kg/m2  SpO2 98% General: Well developed, well appearing 47 year old male in no acute distress. Neck: Supple. JVD not elevated. Lungs: Clear bilaterally to auscultation without wheezes, rales, or rhonchi. Breathing is unlabored. Heart: RRR S1 S2 without murmurs, rubs, or gallops.  Abdomen: Soft, non-distended. Extremities: No clubbing or cyanosis. No edema.  Distal pedal pulses are 2+ and equal bilaterally. Neuro: Alert and oriented X 3. Moves all extremities spontaneously. No focal deficits.  Intake/Output:  Intake/Output Summary (Last 24 hours) at 02/22/13 0802 Last data filed at 02/22/13 0300  Gross per 24 hour  Intake    480 ml  Output   1200 ml  Net   -720 ml    Inpatient Medications:  . aspirin EC  81 mg Oral Daily  . dofetilide  500 mcg Oral Q12H  . Rivaroxaban  20 mg Oral Q supper  . sodium chloride  3 mL Intravenous Q12H    Labs:  Recent Labs  02/21/13 0435 02/22/13 0450  NA 138 139  K 3.8 4.0  CL 104 104  CO2 26 25  GLUCOSE 94 96  BUN 16 15  CREATININE 0.95 0.84  CALCIUM 9.3 9.2  MG 2.1 2.0    Radiology/Studies: No results found.   Telemetry: sinus bradycardia, PVCs; no arrhythmias 12-lead ECG: sinus bradycardia; manual QTc 472 msec    Assessment and Plan  1. Symptomatic AF Remains in SR. QTc stable. Serum Cr, Mg and K normal. Possible DC to home today per Dr. Ladona Ridgel.   Signed, Exie Parody  EP Attending  Patient seen and examined. Agree with above. QT ok but will need watching. Will have him come back for a repeat ECG in one week along with a BMP. He is maintaining NSR.    Leonia Reeves.D.

## 2013-02-22 NOTE — Progress Notes (Signed)
Reviewed discharge instructions with pt, questions answered verbalized understanding. Dc home with prescriptions and belonging Brandon Torres

## 2013-02-27 ENCOUNTER — Ambulatory Visit: Payer: 59 | Admitting: Cardiology

## 2013-02-27 ENCOUNTER — Ambulatory Visit (INDEPENDENT_AMBULATORY_CARE_PROVIDER_SITE_OTHER): Payer: 59 | Admitting: *Deleted

## 2013-02-27 ENCOUNTER — Encounter: Payer: Self-pay | Admitting: *Deleted

## 2013-02-27 VITALS — BP 118/70 | HR 60 | Ht 72.0 in | Wt 319.8 lb

## 2013-02-27 DIAGNOSIS — I4891 Unspecified atrial fibrillation: Secondary | ICD-10-CM

## 2013-02-27 DIAGNOSIS — I10 Essential (primary) hypertension: Secondary | ICD-10-CM

## 2013-02-27 LAB — BASIC METABOLIC PANEL
BUN: 13 mg/dL (ref 6–23)
CO2: 28 mEq/L (ref 19–32)
Calcium: 9.6 mg/dL (ref 8.4–10.5)
Chloride: 103 mEq/L (ref 96–112)
Creat: 0.88 mg/dL (ref 0.50–1.35)
Glucose, Bld: 84 mg/dL (ref 70–99)
Potassium: 4.7 mEq/L (ref 3.5–5.3)
Sodium: 140 mEq/L (ref 135–145)

## 2013-02-27 NOTE — Progress Notes (Signed)
Pt presented to [facility] today for f/u EKG per OV notes on 428-14 GT and 02-16-13 [facility] EKG performed, results reviewed by [facility] and verbal instructions given to have pt f/u post Stress Test (GXT) Pt informed about decision, given labs slips to have BMET drawn today Pt will keep f/u apts ad advised VS 118/70 HR 60, O2 95% via room air, Wt 319lb 12oz, ht 6

## 2013-02-27 NOTE — Patient Instructions (Addendum)
Your physician recommends that you schedule a follow-up appointment in: POST GXT  Your physician recommends that you return for lab work in: TODAY  (BMET, SLIPS GIVEN)  Your physician has requested that you have an exercise tolerance test. For further information please visit [email or web address] Please also follow instruction sheet, as given.  Exercise Stress Electrocardiography A cardiovascular stress test is done to help determine the presence of coronary artery disease (CAD). A stress test is done while you are walking on a treadmill. The goal of an exercise stress test is to raise your heart rate. This will help determine whether your heart can supply itself with enough blood during increased exercise. People with CAD may experience symptoms such as:  Chest pain.  Shortness of breath.  Arm pain. This usually occurs in the left arm but can occur in the right arm.  Forceful or irregular heart beats (palpitations). This may occur with or without dizziness upon activity.  Unusual sweating at rest or profuse sweating with light activity.  Associated nausea with the above symptoms. INSTRUCTIONS PRIOR TO YOUR STRESS TEST  You should not eat or drink for 4 hours prior to your stress test or as told by your caregiver.  This test involves walking on a treadmill. Wear loose fitting clothes and comfortable shoes for the test.  Arrive 1 hour before the test or as told by your caregiver. PROCEDURE  Many sticky patches (electrodes) will be put on your chest.  If you have a hairy chest, small areas may have to be shaved to make better contact with the electrodes.  Once the electrodes are attached to your body, wires will be attached to the electrodes, then to an electrocardiogram (ECG) machine.  The ECG machine will record your heart rhythm. Specific heart rhythm changes will be noted on the ECG printout.  While you are walking on the treadmill, your heart and blood pressure will be  monitored.  The treadmill will be started at a slow pace. The treadmill speed and incline will gradually be increased to raise your heart rate.  The test can be expected to last between 1 and 2 hours. You will need to be monitored after the test to make sure your heart rate and blood pressure are okay before you go home. THE STRESS TEST MAY BE STOPPED IF:  You develop an abnormal heart rate or a very fast heart rate.  You develop chest pain.  You develop high or low blood pressure.  You develop shortness of breath.  You feel dizzy. WHAT DOES AN ABNORMAL RESULT MEAN AND WHAT HAPPENS NEXT?   An abnormal stress test can indicate significant coronary artery disease (CAD). CAD is defined as narrowing in 1 or more heart (coronary) arteries of more than 70%.  If you have an abnormal stress test, this does not mean your are not getting blood flow to your heart. It only means more testing is needed to understand why your test was abnormal. A common follow up test is a coronary angiogram. OBTAINING THE TEST RESULTS It is your responsibility to obtain your test results. Ask the lab or department performing the test when and how you will get your results. SEEK IMMEDIATE MEDICAL CARE IF:  At any time after the stress test you develop:   Chest pain.  Pain radiating down your left arm.  A feeling of being sick to your stomach (nausea).  Vomiting.  Fainting or shortness of breath. MAKE SURE YOU:   Understand these instructions.  Will  watch your condition.  Will get help right away if you are not doing well or get worse. Document Released: 10/08/2000 Document Revised: 01/03/2012 Document Reviewed: 04/09/2009 [facility] Patient Information 2013 [place], [place].

## 2013-03-02 ENCOUNTER — Encounter: Payer: Self-pay | Admitting: Cardiology

## 2013-03-05 ENCOUNTER — Ambulatory Visit (HOSPITAL_COMMUNITY)
Admission: RE | Admit: 2013-03-05 | Discharge: 2013-03-05 | Disposition: A | Discharge status: Home / Self Care | Payer: 59 | Source: Ambulatory Visit | Attending: Cardiology | Admitting: Cardiology

## 2013-03-05 ENCOUNTER — Encounter: Payer: Self-pay | Admitting: *Deleted

## 2013-03-05 DIAGNOSIS — I10 Essential (primary) hypertension: Secondary | ICD-10-CM

## 2013-03-05 DIAGNOSIS — I4891 Unspecified atrial fibrillation: Secondary | ICD-10-CM

## 2013-03-05 NOTE — Progress Notes (Addendum)
Stress Lab Nurses Notes - Brandon Torres  Brandon Torres 03/05/2013 Reason for doing test: AFib Type of test: Regular GTX Nurse performing test: Parke Poisson, RN Nuclear Medicine Tech: Not Applicable Echo Tech: Not Applicable MD performing test: P. Nishan & [name] NP Family MD:  Juanetta Gosling Test explained and consent signed: yes IV started: No IV started Symptoms: Fatigue Treatment/Intervention: None Reason test stopped: fatigue and reached target HR After recovery IV was: NA Patient to return to Nuc. Med at :NA Patient discharged: Home Patient's Condition upon discharge was: stable Comments: During test peak BP 158/62 & HR 164.  Recovery BP 130/58 & HR 90.  Symptoms resolved in recovery. [name] Graded Exercise Test-Interpretation      Graded exercise performed to a work load of 10.4 METs and a heart rate of 164, 93 % of age-predicted maximum.  Exercise discontinued due to dyspnea and fatigue; no chest discomfort reported.  Blood pressure increased from a resting value of 100/70 to 160/60 at peak exercise, a normal response.  No important arrhythmias-infrequent PVCs noted during exercise, PVCs became frequent during recovery.  EKG: Normal sinus rhythm; markedly delayed R-wave progression consistent with previous anterior MI; low-voltage; borderline QT prolongation. Stress EKG:  Insignificant upsloping ST segment depression Impression:  Negative and adequate graded exercise test with no exercise-induced symptoms were EKG abnormalities to suggest myocardial ischemia.  Other findings as noted.  Morrow Bing, M.D.

## 2013-03-05 NOTE — Progress Notes (Signed)
Patient ID: Brandon Torres, male   DOB: Jun 23, 1966, 47 y.o.   MRN: [id number]  Noted.  [place] Bing, MD

## 2013-03-12 ENCOUNTER — Ambulatory Visit (INDEPENDENT_AMBULATORY_CARE_PROVIDER_SITE_OTHER): Payer: 59 | Admitting: Adult Health

## 2013-03-12 ENCOUNTER — Encounter: Payer: Self-pay | Admitting: Adult Health

## 2013-03-12 VITALS — BP 100/68 | HR 66 | Ht 72.0 in | Wt 319.0 lb

## 2013-03-12 DIAGNOSIS — I4891 Unspecified atrial fibrillation: Secondary | ICD-10-CM

## 2013-03-12 NOTE — Progress Notes (Deleted)
Name: Brandon Torres    DOB: 1966-01-26  Age: 47 y.o.  MR#: [id number]       PCP:  Fredirick Maudlin, MD      Insurance: Payor: Advertising copywriter / Plan: Advertising copywriter / Product Type: *No Product type* /   CC:    Chief Complaint  Patient presents with  . Atrial Fibrillation  . Hypertension    VS Filed Vitals:   03/12/13 1113  BP: 100/68  Pulse: 66  Height: 6' (1.829 m)  Weight: 319 lb (144.697 kg)    Weights Current Weight  03/12/13 319 lb (144.697 kg)  02/27/13 319 lb 12 oz (145.038 kg)  02/19/13 320 lb 8.8 oz (145.4 kg)    Blood Pressure  BP Readings from Last 3 Encounters:  03/12/13 100/68  02/27/13 118/70  02/22/13 109/72     Admit date:  (Not on file) Last encounter with RMR:  02/16/2013   Allergy Review of patient's allergies indicates no known allergies.  Current Outpatient Prescriptions  Medication Sig Dispense Refill  . dofetilide (TIKOSYN) 500 MCG capsule Take 1 capsule (500 mcg total) by mouth every 12 (twelve) hours.  60 capsule  3  . Rivaroxaban (XARELTO) 20 MG TABS Take 20 mg by mouth daily.       No current facility-administered medications for this visit.    Discontinued Meds:   There are no discontinued medications.  Patient Active Problem List   Diagnosis Date Noted  . History of diagnostic tests 08/03/2012  . Chronic anticoagulation   . Hyperlipidemia   . Atrial fibrillation 01/03/2012    LABS    Component Value Date/Time   NA 140 02/27/2013 1050   NA 139 02/22/2013 0450   NA 138 02/21/2013 0435   K 4.7 02/27/2013 1050   K 4.0 02/22/2013 0450   K 3.8 02/21/2013 0435   CL 103 02/27/2013 1050   CL 104 02/22/2013 0450   CL 104 02/21/2013 0435   CO2 28 02/27/2013 1050   CO2 25 02/22/2013 0450   CO2 26 02/21/2013 0435   GLUCOSE 84 02/27/2013 1050   GLUCOSE 96 02/22/2013 0450   GLUCOSE 94 02/21/2013 0435   BUN 13 02/27/2013 1050   BUN 15 02/22/2013 0450   BUN 16 02/21/2013 0435   CREATININE 0.88 02/27/2013 1050   CREATININE 0.84 02/22/2013 0450   CREATININE 0.95 02/21/2013 0435   CREATININE 0.87 02/20/2013 0415   CREATININE 0.97 02/16/2013 1625   CREATININE 0.91 09/01/2012 1610   CALCIUM 9.6 02/27/2013 1050   CALCIUM 9.2 02/22/2013 0450   CALCIUM 9.3 02/21/2013 0435   GFRNONAA >90 02/22/2013 0450   GFRNONAA >90 02/21/2013 0435   GFRNONAA >90 02/20/2013 0415   GFRAA >90 02/22/2013 0450   GFRAA >90 02/21/2013 0435   GFRAA >90 02/20/2013 0415   CMP     Component Value Date/Time   NA 140 02/27/2013 1050   K 4.7 02/27/2013 1050   CL 103 02/27/2013 1050   CO2 28 02/27/2013 1050   GLUCOSE 84 02/27/2013 1050   BUN 13 02/27/2013 1050   CREATININE 0.88 02/27/2013 1050   CREATININE 0.84 02/22/2013 0450   CALCIUM 9.6 02/27/2013 1050   PROT 6.6 09/01/2012 1610   ALBUMIN 4.3 09/01/2012 1610   AST 18 09/01/2012 1610   ALT 19 09/01/2012 1610   ALKPHOS 64 09/01/2012 1610   BILITOT 0.6 09/01/2012 1610   GFRNONAA >90 02/22/2013 0450   GFRAA >90 02/22/2013 0450    No results found for this basename:  wbc, hgb, hct, mcv, platelets    Lipid Panel     Component Value Date/Time   CHOL 192 01/04/2012 1320   TRIG 66 01/04/2012 1320   HDL 46 01/04/2012 1320   CHOLHDL 4.2 01/04/2012 1320   VLDL 13 01/04/2012 1320   LDLCALC 133* 01/04/2012 1320    ABG No results found for this basename: phart, pco2, pco2art, po2, po2art, hco3, tco2, acidbasedef, o2sat     Lab Results  Component Value Date   TSH 2.585 09/01/2012   BNP (last 3 results) No results found for this basename: PROBNP,  in the last 8760 hours Cardiac Panel (last 3 results) No results found for this basename: CKTOTAL, CKMB, TROPONINI, RELINDX,  in the last 72 hours  Iron/TIBC/Ferritin No results found for this basename: iron, tibc, ferritin     EKG Orders placed during the hospital encounter of 03/05/13  . EXERCISE TOLERANCE TEST  . EXERCISE TOLERANCE TEST     Prior Assessment and Plan Problem List as of 03/12/2013   Atrial fibrillation   Last Assessment & Plan   02/19/2013 Office Visit Written 02/19/2013  4:17  PM by Mariane Masters, RPH     Reviewed pt's lab.  K- 4.1, Mg- 1.8, amiodarone level- pending.  SCr- 1.0; CrCl>127mL/min. QTc - 440 msec  Discussed with Dr. Ladona Ridgel.  Pt has been off amiodarone > 3 months so will proceed with Tikosyn despite no amiodarone level result available.  Pt thinks he took BID in the past, but based on CrCl, should start with BID and then adjust based on QTc.  Pt aware to report to hospital.      Chronic anticoagulation   Last Assessment & Plan   02/16/2013 Office Visit Written 02/16/2013  4:16 PM by Jodelle Gross, NP     Remain on Xarelto until further notice.    Hyperlipidemia   Last Assessment & Plan   08/02/2012 Office Visit Written 08/03/2012  8:12 PM by Kathlen Brunswick, MD     Average lipid levels.  In the absence of high risk or known vascular disease, pharmacologic therapy is not necessary.    History of diagnostic tests       Imaging: No results found.
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
## 2013-03-12 NOTE — Patient Instructions (Addendum)
Your physician recommends that you schedule a follow-up appointment in: 6 months  

## 2013-03-12 NOTE — Assessment & Plan Note (Signed)
He remains in NSR. Stress test was negative for ischemia by EKG. He continues to have chronic PVCs. Will have him continue with EP specialist, Dr. Ileana Ladd at [facility]. Will send him copies of the stress test, including EKG readings for his review. Continue Tikosyn as directed along with Xarelto.  We will see him again in 6 months unless symptomatic.

## 2013-03-26 ENCOUNTER — Encounter: Payer: Self-pay | Admitting: Internal Medicine

## 2013-03-26 ENCOUNTER — Ambulatory Visit (INDEPENDENT_AMBULATORY_CARE_PROVIDER_SITE_OTHER): Payer: 59 | Admitting: Internal Medicine

## 2013-03-26 VITALS — BP 121/76 | HR 58 | Resp 20 | Ht 72.0 in | Wt 319.0 lb

## 2013-03-26 DIAGNOSIS — I4891 Unspecified atrial fibrillation: Secondary | ICD-10-CM

## 2013-03-26 NOTE — Assessment & Plan Note (Signed)
He is maintaining NSR with Tikosyn. Will continue.

## 2013-03-26 NOTE — Patient Instructions (Addendum)
Your physician wants you to follow-up in: 7 MONTHS with Dr Ladona Ridgel.  You will receive a reminder letter in the mail two months in advance. If you don't receive a letter, please call our office to schedule the follow-up appointment.  Your physician recommends that you continue on your current medications as directed. Please refer to the Current Medication list given to you today.
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
## 2013-05-25 ENCOUNTER — Telehealth: Payer: Self-pay

## 2013-05-25 NOTE — Telephone Encounter (Signed)
Patient wife called and states his heart is out of rhythm, she requested that someone call to check on him today.  She is asking for an appointment Monday or Tuesday with either Dr. Ladona Ridgel or Samara Deist.  He is at work and may not answer, and he will have to call back, ok to leave a message.

## 2013-05-25 NOTE — Telephone Encounter (Signed)
Please make appt with Dr.Taylor first available, [place] or [place]. He may want to adjust medications or plan for another EP study. If oatient becomes symptomatic, dizziness, near syncope, chest pain, racing heart rate or dyspnea, he is to be seen in ER.

## 2013-05-25 NOTE — Telephone Encounter (Signed)
Called pt, pt states that his heart is out of rhythm, he feels flutters, and doesn't feel well. Pt denies CP, dizziness, or shortness of breath.  Cannot check BP due to at work.   Please advise

## 2013-05-25 NOTE — Telephone Encounter (Signed)
Called and advised pt with upcoming appointment on August 20th at 3:00pm in [place] with Dr Ladona Ridgel. Also advised pt if he is symptomatic to be seen in ER. Pt verbally understood.

## 2013-05-28 ENCOUNTER — Telehealth: Payer: Self-pay | Admitting: Internal Medicine

## 2013-05-28 NOTE — Telephone Encounter (Signed)
Pt notes a lot of fluttering constantly, pt notes chest pain with exertion as well as left arm discomfort, pt has not checked BP lately and unable to check at this point, notes SOB when he notes the chest pain while going up the steps as well as dizzyness slightly with position changes, all noted Thursday afternoon, pt advised he really does not want to go to the ER per notes he has been experiencing these same problems on and off for years now and will indeed go if he feels necessary, please advise in absence of GT and RR

## 2013-05-28 NOTE — Telephone Encounter (Signed)
New Prob  Per pt wife, pt is in afib. She said he is feeling terrible, he is very tired and can hardly get up the stairs.

## 2013-05-28 NOTE — Telephone Encounter (Signed)
Work him in to see someone in [place] or by GT in [place] tomorrow. I have confirmed with GT

## 2013-05-28 NOTE — Telephone Encounter (Signed)
Called pt wife to confirm current signs and symptoms, per pt is not very forthcoming with sxs at times and wanted to discuss concerns with nurse pt denies chest pain/notes soreness from the a-fib, pt works as maintenance man and notes going up and down stairs with work and told his wife he almost passed out the other night from climbing steps, pt has denied SOB/swelling via wife, pt wife advised he is at home today given numbers to contact directly [phone] and [phone] to confirm from pt current sxs, noted pt wife would like for pt to be seen prior to 06-13-13 OV for evaluation however notes pt has declined this request, left message for pt to call office to clarify

## 2013-05-29 NOTE — Telephone Encounter (Signed)
Noted GT schedule conflict confirmed, pt accepted apt for 05-31-13 at 3pm with KL NP and was advised that he is to go to the ER with any worsening sxs, pt understood and apt scheduled

## 2013-05-29 NOTE — Telephone Encounter (Signed)
Spoke with the pt whom advised he will be able to see DR GT in [place], contacted [place] office and was advised by KL a pending confirmation from GT to have pt scheduled today in [address] office due to schedule changes will pend apt until confirmation

## 2013-05-31 ENCOUNTER — Ambulatory Visit: Payer: 59 | Admitting: Adult Health

## 2013-05-31 ENCOUNTER — Ambulatory Visit (INDEPENDENT_AMBULATORY_CARE_PROVIDER_SITE_OTHER): Payer: 59 | Admitting: Adult Health

## 2013-05-31 ENCOUNTER — Encounter: Payer: Self-pay | Admitting: Adult Health

## 2013-05-31 VITALS — BP 124/77 | HR 104 | Ht 72.0 in | Wt 318.0 lb

## 2013-05-31 DIAGNOSIS — I4891 Unspecified atrial fibrillation: Secondary | ICD-10-CM

## 2013-05-31 DIAGNOSIS — I10 Essential (primary) hypertension: Secondary | ICD-10-CM

## 2013-05-31 DIAGNOSIS — Z7901 Long term (current) use of anticoagulants: Secondary | ICD-10-CM

## 2013-05-31 MED ORDER — METOPROLOL SUCCINATE 12.5 MG HALF TABLET: 12.5000 mg | ORAL_TABLET | Freq: Every day | ORAL | Status: DC

## 2013-05-31 NOTE — Patient Instructions (Addendum)
Your physician recommends that you schedule a follow-up appointment in: POST STUDY  Your physician has recommended that you have a sleep study. This test records several body functions during sleep, including: brain activity, eye movement, oxygen and carbon dioxide blood levels, heart rate and rhythm, breathing rate and rhythm, the flow of air through your mouth and nose, snoring, body muscle movements, and chest and belly movement.  Your physician has recommended you make the following change in your medication:  1. START METOPROLOL 12.5 MG DAILY
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
## 2013-05-31 NOTE — Assessment & Plan Note (Signed)
Currently the patient remains in normal sinus rhythm with occasional PVCs. His irregular heart rhythm and fluttering is usually associated with switching shifts from daytime and night time or staying up for lengthy periods of time. He also may be suffering from some sleep apnea as he is been found not to be breathing normally at night by his wife. She states they are at times when she does not see him breathing at all. He has a brother who has obstructive sleep apnea as well. The patient's body habitus would lead you to believe that this would be a very good possibility in him as well. As this does lead to recurrent atrial fibrillation I will plan a sleep study. Concerning frequent PVCs, I will have low-dose metoprolol 12.5 mg daily to assist with this. He denies use of caffeine or energy drinks to keep them away while he is working at nighttime. TSH is been found to be normal in the past.  Secondly, I have discussed with the patient the need to settle on one electrophysiologist. He is being seen by Dr. Ileana Ladd and [facility] [place], and also by Dr. Ladona Ridgel. To avoid conflicting he may regimen and testing, I vascular to make a decision concerning who he would like to continue to follow. After discussion with his wife, he would choose to follow Dr. Ladona Ridgel as this is closer to home he and his wife both work in [place], and are willing to see Dr. Ladona Ridgel in the [place] or [place] depending upon his schedule.

## 2013-05-31 NOTE — Assessment & Plan Note (Signed)
He will continue his current medication regimen to include Xarelto 20 mg daily. He offers no complaints of bleeding bruising or hemoptysis.

## 2013-05-31 NOTE — Progress Notes (Deleted)
Name: Brandon Torres    DOB: 08-25-1966  Age: 47 y.o.  MR#: [id number]       PCP:  Fredirick Maudlin, MD      Insurance: Payor: Advertising copywriter / Plan: Advertising copywriter / Product Type: *No Product type* /   CC:    Chief Complaint  Patient presents with  . Atrial Fibrillation    VS Filed Vitals:   05/31/13 1318  BP: 124/77  Pulse: 104  Height: 6' (1.829 m)  Weight: 318 lb (144.244 kg)    Weights Current Weight  05/31/13 318 lb (144.244 kg)  03/26/13 319 lb (144.697 kg)  03/12/13 319 lb (144.697 kg)    Blood Pressure  BP Readings from Last 3 Encounters:  05/31/13 124/77  03/26/13 121/76  03/12/13 100/68     Admit date:  (Not on file) Last encounter with RMR:  03/12/2013   Allergy Review of patient's allergies indicates no known allergies.  Current Outpatient Prescriptions  Medication Sig Dispense Refill  . dofetilide (TIKOSYN) 500 MCG capsule Take 1 capsule (500 mcg total) by mouth every 12 (twelve) hours.  60 capsule  3  . Rivaroxaban (XARELTO) 20 MG TABS Take 20 mg by mouth daily.       No current facility-administered medications for this visit.    Discontinued Meds:   There are no discontinued medications.  Patient Active Problem List   Diagnosis Date Noted  . History of diagnostic tests 08/03/2012  . Chronic anticoagulation   . Hyperlipidemia   . Atrial fibrillation 01/03/2012    LABS    Component Value Date/Time   NA 140 02/27/2013 1050   NA 139 02/22/2013 0450   NA 138 02/21/2013 0435   K 4.7 02/27/2013 1050   K 4.0 02/22/2013 0450   K 3.8 02/21/2013 0435   CL 103 02/27/2013 1050   CL 104 02/22/2013 0450   CL 104 02/21/2013 0435   CO2 28 02/27/2013 1050   CO2 25 02/22/2013 0450   CO2 26 02/21/2013 0435   GLUCOSE 84 02/27/2013 1050   GLUCOSE 96 02/22/2013 0450   GLUCOSE 94 02/21/2013 0435   BUN 13 02/27/2013 1050   BUN 15 02/22/2013 0450   BUN 16 02/21/2013 0435   CREATININE 0.88 02/27/2013 1050   CREATININE 0.84 02/22/2013 0450   CREATININE 0.95 02/21/2013 0435   CREATININE 0.87 02/20/2013 0415   CREATININE 0.97 02/16/2013 1625   CREATININE 0.91 09/01/2012 1610   CALCIUM 9.6 02/27/2013 1050   CALCIUM 9.2 02/22/2013 0450   CALCIUM 9.3 02/21/2013 0435   GFRNONAA >90 02/22/2013 0450   GFRNONAA >90 02/21/2013 0435   GFRNONAA >90 02/20/2013 0415   GFRAA >90 02/22/2013 0450   GFRAA >90 02/21/2013 0435   GFRAA >90 02/20/2013 0415   CMP     Component Value Date/Time   NA 140 02/27/2013 1050   K 4.7 02/27/2013 1050   CL 103 02/27/2013 1050   CO2 28 02/27/2013 1050   GLUCOSE 84 02/27/2013 1050   BUN 13 02/27/2013 1050   CREATININE 0.88 02/27/2013 1050   CREATININE 0.84 02/22/2013 0450   CALCIUM 9.6 02/27/2013 1050   PROT 6.6 09/01/2012 1610   ALBUMIN 4.3 09/01/2012 1610   AST 18 09/01/2012 1610   ALT 19 09/01/2012 1610   ALKPHOS 64 09/01/2012 1610   BILITOT 0.6 09/01/2012 1610   GFRNONAA >90 02/22/2013 0450   GFRAA >90 02/22/2013 0450    No results found for this basename: wbc, hgb, hct, mcv, platelets  Lipid Panel     Component Value Date/Time   CHOL 192 01/04/2012 1320   TRIG 66 01/04/2012 1320   HDL 46 01/04/2012 1320   CHOLHDL 4.2 01/04/2012 1320   VLDL 13 01/04/2012 1320   LDLCALC 133* 01/04/2012 1320    ABG No results found for this basename: phart, pco2, pco2art, po2, po2art, hco3, tco2, acidbasedef, o2sat     Lab Results  Component Value Date   TSH 2.585 09/01/2012   BNP (last 3 results) No results found for this basename: PROBNP,  in the last 8760 hours Cardiac Panel (last 3 results) No results found for this basename: CKTOTAL, CKMB, TROPONINI, RELINDX,  in the last 72 hours  Iron/TIBC/Ferritin No results found for this basename: iron, tibc, ferritin     EKG Orders placed in visit on 05/31/13  . EKG 12-LEAD     Prior Assessment and Plan Problem List as of 05/31/2013   Atrial fibrillation   Last Assessment & Plan   03/26/2013 Office Visit Written 03/26/2013 10:42 AM by Marinus Maw, MD     He is maintaining NSR with Tikosyn. Will continue.     Chronic  anticoagulation   Last Assessment & Plan   02/16/2013 Office Visit Written 02/16/2013  4:16 PM by Jodelle Gross, NP     Remain on Xarelto until further notice.    Hyperlipidemia   Last Assessment & Plan   08/02/2012 Office Visit Written 08/03/2012  8:12 PM by Kathlen Brunswick, MD     Average lipid levels.  In the absence of high risk or known vascular disease, pharmacologic therapy is not necessary.    History of diagnostic tests       Imaging: No results found.

## 2013-05-31 NOTE — Progress Notes (Deleted)
Name: Brandon Torres    DOB: [birth year]  Age: 47 y.o.  MR#: [id number]       PCP:  Fredirick Maudlin, MD      Insurance: Payor: Advertising copywriter / Plan: Advertising copywriter / Product Type: *No Product type* /   CC:    Chief Complaint  Patient presents with  . Atrial Fibrillation    VS Filed Vitals:   05/31/13 1318  BP: 124/77  Pulse: 104  Height: 6' (1.829 m)  Weight: 318 lb (144.244 kg)    Weights Current Weight  05/31/13 318 lb (144.244 kg)  03/26/13 319 lb (144.697 kg)  03/12/13 319 lb (144.697 kg)    Blood Pressure  BP Readings from Last 3 Encounters:  05/31/13 124/77  03/26/13 121/76  03/12/13 100/68     Admit date:  (Not on file) Last encounter with RMR:  03/12/2013   Allergy Review of patient's allergies indicates no known allergies.  Current Outpatient Prescriptions  Medication Sig Dispense Refill  . dofetilide (TIKOSYN) 500 MCG capsule Take 1 capsule (500 mcg total) by mouth every 12 (twelve) hours.  60 capsule  3  . Rivaroxaban (XARELTO) 20 MG TABS Take 20 mg by mouth daily.       No current facility-administered medications for this visit.    Discontinued Meds:   There are no discontinued medications.  Patient Active Problem List   Diagnosis Date Noted  . History of diagnostic tests 08/03/2012  . Chronic anticoagulation   . Hyperlipidemia   . Atrial fibrillation 01/03/2012    LABS    Component Value Date/Time   NA 140 02/27/2013 1050   NA 139 02/22/2013 0450   NA 138 02/21/2013 0435   K 4.7 02/27/2013 1050   K 4.0 02/22/2013 0450   K 3.8 02/21/2013 0435   CL 103 02/27/2013 1050   CL 104 02/22/2013 0450   CL 104 02/21/2013 0435   CO2 28 02/27/2013 1050   CO2 25 02/22/2013 0450   CO2 26 02/21/2013 0435   GLUCOSE 84 02/27/2013 1050   GLUCOSE 96 02/22/2013 0450   GLUCOSE 94 02/21/2013 0435   BUN 13 02/27/2013 1050   BUN 15 02/22/2013 0450   BUN 16 02/21/2013 0435   CREATININE 0.88 02/27/2013 1050   CREATININE 0.84 02/22/2013 0450   CREATININE 0.95 02/21/2013 0435   CREATININE 0.87 02/20/2013 0415   CREATININE 0.97 02/16/2013 1625   CREATININE 0.91 09/01/2012 1610   CALCIUM 9.6 02/27/2013 1050   CALCIUM 9.2 02/22/2013 0450   CALCIUM 9.3 02/21/2013 0435   GFRNONAA >90 02/22/2013 0450   GFRNONAA >90 02/21/2013 0435   GFRNONAA >90 02/20/2013 0415   GFRAA >90 02/22/2013 0450   GFRAA >90 02/21/2013 0435   GFRAA >90 02/20/2013 0415   CMP     Component Value Date/Time   NA 140 02/27/2013 1050   K 4.7 02/27/2013 1050   CL 103 02/27/2013 1050   CO2 28 02/27/2013 1050   GLUCOSE 84 02/27/2013 1050   BUN 13 02/27/2013 1050   CREATININE 0.88 02/27/2013 1050   CREATININE 0.84 02/22/2013 0450   CALCIUM 9.6 02/27/2013 1050   PROT 6.6 09/01/2012 1610   ALBUMIN 4.3 09/01/2012 1610   AST 18 09/01/2012 1610   ALT 19 09/01/2012 1610   ALKPHOS 64 09/01/2012 1610   BILITOT 0.6 09/01/2012 1610   GFRNONAA >90 02/22/2013 0450   GFRAA >90 02/22/2013 0450    No results found for this basename: wbc, hgb, hct, mcv, platelets  Lipid Panel     Component Value Date/Time   CHOL 192 01/04/2012 1320   TRIG 66 01/04/2012 1320   HDL 46 01/04/2012 1320   CHOLHDL 4.2 01/04/2012 1320   VLDL 13 01/04/2012 1320   LDLCALC 133* 01/04/2012 1320    ABG No results found for this basename: phart, pco2, pco2art, po2, po2art, hco3, tco2, acidbasedef, o2sat     Lab Results  Component Value Date   TSH 2.585 09/01/2012   BNP (last 3 results) No results found for this basename: PROBNP,  in the last 8760 hours Cardiac Panel (last 3 results) No results found for this basename: CKTOTAL, CKMB, TROPONINI, RELINDX,  in the last 72 hours  Iron/TIBC/Ferritin No results found for this basename: iron, tibc, ferritin     EKG Orders placed in visit on 03/26/13  . EKG 12-LEAD     Prior Assessment and Plan Problem List as of 05/31/2013     Cardiovascular and Mediastinum   Atrial fibrillation   Last Assessment & Plan   03/26/2013 Office Visit Written 03/26/2013 10:42 AM by Marinus Maw, MD     He is maintaining NSR with  Tikosyn. Will continue.       Other   Chronic anticoagulation   Last Assessment & Plan   02/16/2013 Office Visit Written 02/16/2013  4:16 PM by Jodelle Gross, NP     Remain on Xarelto until further notice.    Hyperlipidemia   Last Assessment & Plan   08/02/2012 Office Visit Written 08/03/2012  8:12 PM by Kathlen Brunswick, MD     Average lipid levels.  In the absence of high risk or known vascular disease, pharmacologic therapy is not necessary.    History of diagnostic tests       Imaging: No results found.

## 2013-06-13 ENCOUNTER — Ambulatory Visit: Payer: 59 | Admitting: Internal Medicine

## 2013-06-13 ENCOUNTER — Telehealth: Payer: Self-pay | Admitting: *Deleted

## 2013-06-13 NOTE — Telephone Encounter (Signed)
Please advise per pt had apt with [facility] today in [place] per scheduled prior to recent OV, received call from GL to contact pt to discuss the need for this apt today per noted [facility] schedule overbooked, pt advised he does not feel he needs the f/u apt however wanted to see if any adjustments needed to be made, please advise

## 2013-06-13 NOTE — Telephone Encounter (Signed)
FYI, pt wife called back after this phone notation sent in and requested for the pt to be seen, the pt was scheduled to see [facility] on 06-18-13

## 2013-06-13 NOTE — Telephone Encounter (Signed)
Needs follow up with Dr. Ladona Ridgel for medication adjustments and management. Very important that he sees him as he is managing his Atrial fib. Refill meds that he is currently taking for one month only. He needs to see Dr. Ladona Ridgel for changes.

## 2013-06-13 NOTE — Telephone Encounter (Signed)
PT NEEDS XARELTO AND TYKISON CALLED IN.   PT IS STILL HAVING SAME ISSUES AS APPT 05/31/13 SEEMS TO BE NOT A BAD BECAUSE HE HAS CUT BACK ON WORK. NEEDS TO KNOW IF MEDS SHOULD BE ADJUSTED AGAIN?

## 2013-06-18 ENCOUNTER — Ambulatory Visit (INDEPENDENT_AMBULATORY_CARE_PROVIDER_SITE_OTHER): Payer: 59 | Admitting: Internal Medicine

## 2013-06-18 ENCOUNTER — Encounter: Payer: Self-pay | Admitting: Internal Medicine

## 2013-06-18 VITALS — BP 108/74 | HR 67 | Ht 72.0 in | Wt 323.0 lb

## 2013-06-18 DIAGNOSIS — I10 Essential (primary) hypertension: Secondary | ICD-10-CM

## 2013-06-18 DIAGNOSIS — I4891 Unspecified atrial fibrillation: Secondary | ICD-10-CM

## 2013-06-18 DIAGNOSIS — R079 Chest pain, unspecified: Secondary | ICD-10-CM

## 2013-06-18 MED ORDER — NITROGLYCERIN 0.4 MG SL SUBL: 0.4000 mg | SUBLINGUAL_TABLET | SUBLINGUAL | Status: DC | PRN

## 2013-06-18 MED ORDER — RIVAROXABAN 20 MG PO TABS: 20.0000 mg | ORAL_TABLET | Freq: Every day | ORAL | Status: DC

## 2013-06-18 MED ORDER — DOFETILIDE 500 MCG PO CAPS: 500.0000 ug | ORAL_CAPSULE | Freq: Two times a day (BID) | ORAL | Status: DC

## 2013-06-18 MED ORDER — METOPROLOL SUCCINATE ER 25 MG PO TB24: 25.0000 mg | ORAL_TABLET | Freq: Every day | ORAL | Status: DC

## 2013-06-18 NOTE — Patient Instructions (Addendum)
Your physician recommends that you schedule a follow-up appointment in: 3 Months with Dr Ladona Ridgel  Your physician has recommended you make the following change in your medication:  1. Toprol XL to 25 mg daily 2. Nitro 0.4 mg when needed  The proper use and anticipated side effects of nitroglycerine has been carefully explained.  If a single episode of chest pain is not relieved by one tablet, the patient will try another within 5 minutes; and if this doesn't relieve the pain, the patient is instructed to call 911 for transportation to an emergency department.

## 2013-06-18 NOTE — Assessment & Plan Note (Signed)
His symptoms are atypical and he has had a negative stress test. Will give additional beta blocker. A SLNTG prescription has been given. If his symptoms persist, he will require repeat heart cath.
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
## 2013-06-18 NOTE — Assessment & Plan Note (Signed)
He appears to be in NSR on Tikosyn. Continue current meds.

## 2013-06-20 ENCOUNTER — Ambulatory Visit: Payer: 59 | Attending: Adult Health | Admitting: Sleep Medicine

## 2013-06-20 DIAGNOSIS — I4891 Unspecified atrial fibrillation: Secondary | ICD-10-CM | POA: Insufficient documentation

## 2013-06-20 DIAGNOSIS — I4949 Other premature depolarization: Secondary | ICD-10-CM | POA: Insufficient documentation

## 2013-06-20 DIAGNOSIS — G4730 Sleep apnea, unspecified: Secondary | ICD-10-CM

## 2013-06-20 DIAGNOSIS — I10 Essential (primary) hypertension: Secondary | ICD-10-CM

## 2013-06-20 DIAGNOSIS — G4733 Obstructive sleep apnea (adult) (pediatric): Secondary | ICD-10-CM | POA: Insufficient documentation

## 2013-06-26 NOTE — Procedures (Signed)
[facility] Alexey Rhoads A. Gerilyn Pilgrim, MD     [email or web address]        NAME:  Brandon Torres, KNOBLOCK              ACCOUNT NO.:  [id number]  MEDICAL RECORD NO.:  [id number]          PATIENT TYPE:  OUT  LOCATION:  SLEEP LAB                     FACILITY:  [facility]  PHYSICIAN:  Anaily Ashbaugh A. Gerilyn Pilgrim, M.D. DATE OF BIRTH:  01/12/1966  DATE OF STUDY:  06/20/2013                           NOCTURNAL POLYSOMNOGRAM  REFERRING PHYSICIAN:  Bettey Mare. Lawrence, NP  INDICATION:  A 47 year old man who has history of obesity and atrial fibrillation.  He presents with snoring and fatigue.  The study is being done to evaluate for obstructive sleep apnea syndrome.   MEDICATIONS:  Xarelto and metoprolol.  EPWORTH SLEEPINESS SCALE:  2.  BMI 43.  ARCHITECTURAL SUMMARY:  The total recording time is 374 minutes.  Sleep efficiency is 68%.  Sleep latency 24 minutes.  REM latency 155 minutes. Stage N1 of 11%, N2 of 67%, N3 of 0%, and REM sleep 21%.  RESPIRATORY SUMMARY:  Baseline oxygen saturation is 94, lowest saturation 81 during both REM and non-REM sleep.  Diagnostic AHI is 21 and RDI 22.  LIMB MOVEMENT SUMMARY:  PLM index 0.  ELECTROCARDIOGRAM SUMMARY:  Average heart rate is 57.  The patient is noted to have frequent PVCs.  There is also intermittent/paroxysmal atrial fibrillation.  IMPRESSION:  Moderate obstructive sleep apnea syndrome.  RECOMMENDATION:  Formal CPAP titration recording.  Thanks for this referral.     Breeona Waid A. Gerilyn Pilgrim, M.D.    KAD/MEDQ  D:  06/26/2013 09:17:22  T:  06/26/2013 09:37:54  Job:  [id number]

## 2013-06-29 ENCOUNTER — Encounter: Payer: Self-pay | Admitting: Adult Health

## 2013-07-02 ENCOUNTER — Telehealth: Payer: Self-pay | Admitting: *Deleted

## 2013-07-02 NOTE — Telephone Encounter (Signed)
error 

## 2013-07-02 NOTE — Addendum Note (Signed)
Addended by: Thompson Grayer on: 07/02/2013 01:24 PM   Modules accepted: Orders

## 2013-07-09 ENCOUNTER — Telehealth: Payer: Self-pay | Admitting: *Deleted

## 2013-07-09 NOTE — Telephone Encounter (Signed)
RETURNED YOU CALL Call [phone]

## 2013-07-09 NOTE — Telephone Encounter (Signed)
.  left message to have patient return my call on the mobile number provided for pt spouse, called home number and also left a vm

## 2013-07-09 NOTE — Telephone Encounter (Signed)
.  left message to have patient return my call.  

## 2013-07-09 NOTE — Telephone Encounter (Signed)
Pt wife calling about sleep apnea results, noted Dr Gerilyn Pilgrim notations in chart, pt was advised by Dr Juanetta Gosling for our office to advise the instructions/results and next steps concerning the sleep apnea, pt also requested if the machine is ordered by [name] we need to send in for a machine with a regulator on it if possible, this nurse advised we will clarify whom is to send the orders for the equipment per possible that Dr Juanetta Gosling will need to set this up for the pt, pt understood as well that KL is out of the office until wed

## 2013-07-09 NOTE — Telephone Encounter (Signed)
Spoke to pt to advise results/instructions. Pt understood. Pt will contact PCP Hawkins to have the equipment set up

## 2013-07-09 NOTE — Telephone Encounter (Signed)
He is to be followed by Dr.Hawkins as directed for treatment of OSA as you instructed.

## 2013-08-15 ENCOUNTER — Other Ambulatory Visit (HOSPITAL_COMMUNITY): Payer: Self-pay | Admitting: Pulmonary Disease

## 2013-08-15 DIAGNOSIS — M545 Low back pain, unspecified: Secondary | ICD-10-CM

## 2013-08-17 ENCOUNTER — Ambulatory Visit (HOSPITAL_COMMUNITY)
Admission: RE | Admit: 2013-08-17 | Discharge: 2013-08-17 | Disposition: A | Discharge status: Home / Self Care | Payer: 59 | Source: Ambulatory Visit | Attending: Pulmonary Disease | Admitting: Pulmonary Disease

## 2013-08-17 DIAGNOSIS — M545 Low back pain, unspecified: Secondary | ICD-10-CM | POA: Insufficient documentation

## 2013-08-17 DIAGNOSIS — M79609 Pain in unspecified limb: Secondary | ICD-10-CM | POA: Insufficient documentation

## 2013-08-17 DIAGNOSIS — M5126 Other intervertebral disc displacement, lumbar region: Secondary | ICD-10-CM | POA: Insufficient documentation

## 2013-08-30 ENCOUNTER — Ambulatory Visit (INDEPENDENT_AMBULATORY_CARE_PROVIDER_SITE_OTHER): Payer: 59 | Admitting: Internal Medicine

## 2013-08-30 ENCOUNTER — Other Ambulatory Visit: Payer: Self-pay

## 2013-08-30 ENCOUNTER — Encounter: Payer: Self-pay | Admitting: Internal Medicine

## 2013-08-30 VITALS — BP 104/64 | HR 64 | Ht 71.0 in | Wt 324.0 lb

## 2013-08-30 DIAGNOSIS — I10 Essential (primary) hypertension: Secondary | ICD-10-CM

## 2013-08-30 DIAGNOSIS — I4891 Unspecified atrial fibrillation: Secondary | ICD-10-CM

## 2013-08-30 DIAGNOSIS — R079 Chest pain, unspecified: Secondary | ICD-10-CM

## 2013-08-30 MED ORDER — DOFETILIDE 500 MCG PO CAPS: 500.0000 ug | ORAL_CAPSULE | Freq: Two times a day (BID) | ORAL | Status: DC
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
## 2013-08-30 NOTE — Patient Instructions (Addendum)
Your physician recommends that you schedule a follow-up appointment in: 9 months with Dr Court Joy will receive a reminder letter two months in advance reminding you to call and schedule your appointment. If you don't receive this letter, please contact our office.  Your physician recommends that you continue on your current medications as directed. Please refer to the Current Medication list given to you today.

## 2013-08-30 NOTE — Assessment & Plan Note (Signed)
He is maintaining sinus rhythm very nicely. No change in medications today.

## 2013-08-30 NOTE — Assessment & Plan Note (Signed)
He has had no anginal symptoms. I've encouraged the patient to increase his physical activity.

## 2013-10-09 ENCOUNTER — Telehealth: Payer: Self-pay | Admitting: Internal Medicine

## 2013-10-09 DIAGNOSIS — I4891 Unspecified atrial fibrillation: Secondary | ICD-10-CM

## 2013-10-09 DIAGNOSIS — I10 Essential (primary) hypertension: Secondary | ICD-10-CM

## 2013-10-09 MED ORDER — METOPROLOL SUCCINATE ER 25 MG PO TB24: 25.0000 mg | ORAL_TABLET | Freq: Every day | ORAL | Status: DC

## 2013-10-09 NOTE — Telephone Encounter (Signed)
Received fax refill request  Rx # [id number] Medication:  Metoprolol Succ ER 25 mg tab Qty 30 Sig:  Take one tablet by mouth daily Physician:  Ladona Ridgel

## 2013-10-09 NOTE — Telephone Encounter (Signed)
rx called to pharmacy 

## 2013-11-12 ENCOUNTER — Telehealth: Payer: Self-pay | Admitting: Internal Medicine

## 2013-11-12 DIAGNOSIS — I10 Essential (primary) hypertension: Secondary | ICD-10-CM

## 2013-11-12 DIAGNOSIS — I4891 Unspecified atrial fibrillation: Secondary | ICD-10-CM

## 2013-11-12 MED ORDER — METOPROLOL SUCCINATE ER 25 MG PO TB24: 25.0000 mg | ORAL_TABLET | Freq: Every day | ORAL | Status: DC

## 2013-11-12 MED ORDER — RIVAROXABAN 20 MG PO TABS: 20.0000 mg | ORAL_TABLET | Freq: Every day | ORAL | Status: DC

## 2013-11-12 NOTE — Telephone Encounter (Signed)
Needs refills on Xarelto 20 mg and Metoprolol Succ ER 25 mg sent to [facility] / [facility]

## 2013-11-12 NOTE — Telephone Encounter (Signed)
Refills done.

## 2014-02-21 ENCOUNTER — Encounter: Payer: Self-pay | Admitting: Adult Health

## 2014-02-21 ENCOUNTER — Ambulatory Visit (INDEPENDENT_AMBULATORY_CARE_PROVIDER_SITE_OTHER): Payer: 59 | Admitting: Adult Health

## 2014-02-21 VITALS — BP 140/63 | HR 65 | Ht 71.0 in | Wt 320.0 lb

## 2014-02-21 DIAGNOSIS — I10 Essential (primary) hypertension: Secondary | ICD-10-CM

## 2014-02-21 DIAGNOSIS — I4891 Unspecified atrial fibrillation: Secondary | ICD-10-CM

## 2014-02-21 DIAGNOSIS — Z7901 Long term (current) use of anticoagulants: Secondary | ICD-10-CM

## 2014-02-21 DIAGNOSIS — R079 Chest pain, unspecified: Secondary | ICD-10-CM

## 2014-02-21 MED ORDER — METOPROLOL SUCCINATE ER 25 MG PO TB24: 37.5000 mg | ORAL_TABLET | Freq: Every day | ORAL | Status: DC

## 2014-02-21 NOTE — Assessment & Plan Note (Signed)
Continue Xarelto 

## 2014-02-21 NOTE — Progress Notes (Deleted)
Name: Brandon Torres    DOB: [birth year]  Age: 48 y.o.  MR#: [id number]       PCP:  Fredirick Maudlin, MD      Insurance: Payor: Advertising copywriter / Plan: Advertising copywriter / Product Type: *No Product type* /   CC:   No chief complaint on file.   VS Filed Vitals:   02/21/14 1359  BP: 140/63  Pulse: 65  Height: 5\' 11"  (1.803 m)  Weight: 320 lb (145.151 kg)    Weights Current Weight  02/21/14 320 lb (145.151 kg)  08/30/13 324 lb 0.6 oz (146.984 kg)  06/18/13 323 lb (146.512 kg)    Blood Pressure  BP Readings from Last 3 Encounters:  02/21/14 140/63  08/30/13 104/64  06/18/13 108/74     Admit date:  (Not on file) Last encounter with RMR:  Visit date not found   Allergy Review of patient's allergies indicates no known allergies.  Current Outpatient Prescriptions  Medication Sig Dispense Refill  . dofetilide (TIKOSYN) 500 MCG capsule Take 1 capsule (500 mcg total) by mouth every 12 (twelve) hours.  60 capsule  9  . metoprolol succinate (TOPROL-XL) 25 MG 24 hr tablet Take 1 tablet (25 mg total) by mouth daily.  90 tablet  3  . nitroGLYCERIN (NITROSTAT) 0.4 MG SL tablet Place 1 tablet (0.4 mg total) under the tongue every 5 (five) minutes as needed for chest pain.  60 tablet  0  . Rivaroxaban (XARELTO) 20 MG TABS tablet Take 1 tablet (20 mg total) by mouth daily.  30 tablet  3   No current facility-administered medications for this visit.    Discontinued Meds:    Medications Discontinued During This Encounter  Medication Reason  . ondansetron (ZOFRAN-ODT) 8 MG disintegrating tablet Error    Patient Active Problem List   Diagnosis Date Noted  . Chest pain 06/18/2013  . History of diagnostic tests 08/03/2012  . Chronic anticoagulation   . Hyperlipidemia   . Atrial fibrillation 01/03/2012    LABS    Component Value Date/Time   NA 140 02/27/2013 1050   NA 139 02/22/2013 0450   NA 138 02/21/2013 0435   K 4.7 02/27/2013 1050   K 4.0 02/22/2013 0450   K 3.8 02/21/2013 0435    CL 103 02/27/2013 1050   CL 104 02/22/2013 0450   CL 104 02/21/2013 0435   CO2 28 02/27/2013 1050   CO2 25 02/22/2013 0450   CO2 26 02/21/2013 0435   GLUCOSE 84 02/27/2013 1050   GLUCOSE 96 02/22/2013 0450   GLUCOSE 94 02/21/2013 0435   BUN 13 02/27/2013 1050   BUN 15 02/22/2013 0450   BUN 16 02/21/2013 0435   CREATININE 0.88 02/27/2013 1050   CREATININE 0.84 02/22/2013 0450   CREATININE 0.95 02/21/2013 0435   CREATININE 0.87 02/20/2013 0415   CREATININE 0.97 02/16/2013 1625   CREATININE 0.91 09/01/2012 1610   CALCIUM 9.6 02/27/2013 1050   CALCIUM 9.2 02/22/2013 0450   CALCIUM 9.3 02/21/2013 0435   GFRNONAA >90 02/22/2013 0450   GFRNONAA >90 02/21/2013 0435   GFRNONAA >90 02/20/2013 0415   GFRAA >90 02/22/2013 0450   GFRAA >90 02/21/2013 0435   GFRAA >90 02/20/2013 0415   CMP     Component Value Date/Time   NA 140 02/27/2013 1050   K 4.7 02/27/2013 1050   CL 103 02/27/2013 1050   CO2 28 02/27/2013 1050   GLUCOSE 84 02/27/2013 1050   BUN 13 02/27/2013 1050  CREATININE 0.88 02/27/2013 1050   CREATININE 0.84 02/22/2013 0450   CALCIUM 9.6 02/27/2013 1050   PROT 6.6 09/01/2012 1610   ALBUMIN 4.3 09/01/2012 1610   AST 18 09/01/2012 1610   ALT 19 09/01/2012 1610   ALKPHOS 64 09/01/2012 1610   BILITOT 0.6 09/01/2012 1610   GFRNONAA >90 02/22/2013 0450   GFRAA >90 02/22/2013 0450    No results found for this basename: wbc, hgb, hct, mcv, platelets    Lipid Panel     Component Value Date/Time   CHOL 192 01/04/2012 1320   TRIG 66 01/04/2012 1320   HDL 46 01/04/2012 1320   CHOLHDL 4.2 01/04/2012 1320   VLDL 13 01/04/2012 1320   LDLCALC 133* 01/04/2012 1320    ABG No results found for this basename: phart, pco2, pco2art, po2, po2art, hco3, tco2, acidbasedef, o2sat     Lab Results  Component Value Date   TSH 2.585 09/01/2012   BNP (last 3 results) No results found for this basename: PROBNP,  in the last 8760 hours Cardiac Panel (last 3 results) No results found for this basename: CKTOTAL, CKMB, TROPONINI, RELINDX,  in the last  72 hours  Iron/TIBC/Ferritin No results found for this basename: iron, tibc, ferritin     EKG Orders placed in visit on 02/21/14  . EKG 12-LEAD     Prior Assessment and Plan Problem List as of 02/21/2014     Cardiovascular and Mediastinum   Atrial fibrillation   Last Assessment & Plan   08/30/2013 Office Visit Written 08/30/2013  9:31 AM by Marinus MawGregg W Taylor, MD     He is maintaining sinus rhythm very nicely. No change in medications today.      Other   Chronic anticoagulation   Last Assessment & Plan   05/31/2013 Office Visit Written 05/31/2013  5:50 PM by Jodelle GrossKathryn M Lawrence, NP     He will continue his current medication regimen to include Xarelto 20 mg daily. He offers no complaints of bleeding bruising or hemoptysis.    Hyperlipidemia   Last Assessment & Plan   08/02/2012 Office Visit Written 08/03/2012  8:12 PM by Kathlen Brunswickobert M Rothbart, MD     Average lipid levels.  In the absence of high risk or known vascular disease, pharmacologic therapy is not necessary.    History of diagnostic tests   Chest pain   Last Assessment & Plan   08/30/2013 Office Visit Written 08/30/2013  9:32 AM by Marinus MawGregg W Taylor, MD     He has had no anginal symptoms. I've encouraged the patient to increase his physical activity.        Imaging: No results found.

## 2014-02-21 NOTE — Assessment & Plan Note (Signed)
Denies any symptoms of this. Continue to follow.
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
## 2014-02-21 NOTE — Patient Instructions (Signed)
Your physician recommends that you schedule a follow-up appointment in: 1 month  with [name], NP  Take Toprol XL 25 mg 1 and a half tablets daily to equal 37.5  Your physician recommends that you return for lab work this week. TSH, BMET

## 2014-02-21 NOTE — Assessment & Plan Note (Signed)
Remains in NSR but has multiple PVC;s, I will check a BMET, TSH and increase his metoprolol to 25 mg 1 1/2 tablet daily. He will return in one month. He is advised to wear his CPAP as directed.

## 2014-02-22 LAB — BASIC METABOLIC PANEL
BUN: 15 mg/dL (ref 6–23)
CO2: 29 mEq/L (ref 19–32)
Calcium: 9.3 mg/dL (ref 8.4–10.5)
Chloride: 104 mEq/L (ref 96–112)
Creat: 0.85 mg/dL (ref 0.50–1.35)
Glucose, Bld: 94 mg/dL (ref 70–99)
Potassium: 4.6 mEq/L (ref 3.5–5.3)
Sodium: 140 mEq/L (ref 135–145)

## 2014-02-22 LAB — TSH: TSH: 3.061 u[IU]/mL (ref 0.350–4.500)

## 2014-02-25 ENCOUNTER — Encounter: Payer: Self-pay | Admitting: *Deleted

## 2014-03-07 ENCOUNTER — Telehealth: Payer: Self-pay | Admitting: Internal Medicine

## 2014-03-07 DIAGNOSIS — I10 Essential (primary) hypertension: Secondary | ICD-10-CM

## 2014-03-07 DIAGNOSIS — I4891 Unspecified atrial fibrillation: Secondary | ICD-10-CM

## 2014-03-07 MED ORDER — RIVAROXABAN 20 MG PO TABS: 20.0000 mg | ORAL_TABLET | Freq: Every day | ORAL | Status: DC

## 2014-03-07 NOTE — Telephone Encounter (Signed)
Received fax refill request  Rx # [id number] Medication:  Xarelto 20 mg tablet Qty 30 Sig:  Take one tablet by mouth every day with dinner Physician:  Ladona Ridgel

## 2014-03-07 NOTE — Telephone Encounter (Signed)
Refill complete 

## 2014-03-22 ENCOUNTER — Ambulatory Visit: Payer: 59 | Admitting: Adult Health
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
## 2014-03-29 ENCOUNTER — Encounter: Payer: Self-pay | Admitting: Adult Health

## 2014-03-29 ENCOUNTER — Ambulatory Visit (INDEPENDENT_AMBULATORY_CARE_PROVIDER_SITE_OTHER): Payer: 59 | Admitting: Adult Health

## 2014-03-29 VITALS — BP 90/58 | HR 57 | Ht 72.0 in | Wt 320.0 lb

## 2014-03-29 DIAGNOSIS — I4891 Unspecified atrial fibrillation: Secondary | ICD-10-CM

## 2014-03-29 DIAGNOSIS — Z7901 Long term (current) use of anticoagulants: Secondary | ICD-10-CM

## 2014-03-29 DIAGNOSIS — Z5181 Encounter for therapeutic drug level monitoring: Secondary | ICD-10-CM

## 2014-03-29 LAB — BASIC METABOLIC PANEL
BUN: 11 mg/dL (ref 6–23)
CO2: 31 mEq/L (ref 19–32)
Calcium: 9.1 mg/dL (ref 8.4–10.5)
Chloride: 103 mEq/L (ref 96–112)
Creat: 0.93 mg/dL (ref 0.50–1.35)
Glucose, Bld: 89 mg/dL (ref 70–99)
Potassium: 4.6 mEq/L (ref 3.5–5.3)
Sodium: 140 mEq/L (ref 135–145)

## 2014-03-29 NOTE — Assessment & Plan Note (Signed)
Continues on Xarelto.  No complaints of bleeding. Refills provided

## 2014-03-29 NOTE — Assessment & Plan Note (Addendum)
Remains in NSR rates in 60. Occasional PVC's. QT interval .446, Qtc. 422. I have rechecked BP in exam room. 120/68.   He denies bleeding or melena. Feels much better with increased dose of metoprolol. Continue Tikosyn. Follow up with Dr, Ladona Ridgel on previously scheduled appt.

## 2014-03-29 NOTE — Progress Notes (Deleted)
Name: Brandon Torres    DOB: Mar 07, 1966  Age: 48 y.o.  MR#: [id number]       PCP:  Fredirick Maudlin, MD      Insurance: Payor: Advertising copywriter / Plan: Advertising copywriter / Product Type: *No Product type* /   CC:    Chief Complaint  Patient presents with  . Atrial Fibrillation  . Cardiomyopathy    VS Filed Vitals:   03/29/14 1543  BP: 90/58  Pulse: 57  Height: 6' (1.829 m)  Weight: 320 lb (145.151 kg)  SpO2: 97%    Weights Current Weight  03/29/14 320 lb (145.151 kg)  02/21/14 320 lb (145.151 kg)  08/30/13 324 lb 0.6 oz (146.984 kg)    Blood Pressure  BP Readings from Last 3 Encounters:  03/29/14 90/58  02/21/14 140/63  08/30/13 104/64     Admit date:  (Not on file) Last encounter with RMR:  02/21/2014   Allergy Review of patient's allergies indicates no known allergies.  Current Outpatient Prescriptions  Medication Sig Dispense Refill  . dofetilide (TIKOSYN) 500 MCG capsule Take 1 capsule (500 mcg total) by mouth every 12 (twelve) hours.  60 capsule  9  . metoprolol succinate (TOPROL-XL) 25 MG 24 hr tablet Take 1.5 tablets (37.5 mg total) by mouth daily.  90 tablet  3  . nitroGLYCERIN (NITROSTAT) 0.4 MG SL tablet Place 1 tablet (0.4 mg total) under the tongue every 5 (five) minutes as needed for chest pain.  60 tablet  0  . rivaroxaban (XARELTO) 20 MG TABS tablet Take 1 tablet (20 mg total) by mouth daily.  30 tablet  3   No current facility-administered medications for this visit.    Discontinued Meds:   There are no discontinued medications.  Patient Active Problem List   Diagnosis Date Noted  . Chest pain 06/18/2013  . History of diagnostic tests 08/03/2012  . Chronic anticoagulation   . Hyperlipidemia   . Atrial fibrillation 01/03/2012    LABS    Component Value Date/Time   NA 140 02/21/2014 1009   NA 140 02/27/2013 1050   NA 139 02/22/2013 0450   K 4.6 02/21/2014 1009   K 4.7 02/27/2013 1050   K 4.0 02/22/2013 0450   CL 104 02/21/2014 1009   CL 103  02/27/2013 1050   CL 104 02/22/2013 0450   CO2 29 02/21/2014 1009   CO2 28 02/27/2013 1050   CO2 25 02/22/2013 0450   GLUCOSE 94 02/21/2014 1009   GLUCOSE 84 02/27/2013 1050   GLUCOSE 96 02/22/2013 0450   BUN 15 02/21/2014 1009   BUN 13 02/27/2013 1050   BUN 15 02/22/2013 0450   CREATININE 0.85 02/21/2014 1009   CREATININE 0.88 02/27/2013 1050   CREATININE 0.84 02/22/2013 0450   CREATININE 0.95 02/21/2013 0435   CREATININE 0.87 02/20/2013 0415   CREATININE 0.97 02/16/2013 1625   CALCIUM 9.3 02/21/2014 1009   CALCIUM 9.6 02/27/2013 1050   CALCIUM 9.2 02/22/2013 0450   GFRNONAA >90 02/22/2013 0450   GFRNONAA >90 02/21/2013 0435   GFRNONAA >90 02/20/2013 0415   GFRAA >90 02/22/2013 0450   GFRAA >90 02/21/2013 0435   GFRAA >90 02/20/2013 0415   CMP     Component Value Date/Time   NA 140 02/21/2014 1009   K 4.6 02/21/2014 1009   CL 104 02/21/2014 1009   CO2 29 02/21/2014 1009   GLUCOSE 94 02/21/2014 1009   BUN 15 02/21/2014 1009   CREATININE 0.85 02/21/2014 1009  CREATININE 0.84 02/22/2013 0450   CALCIUM 9.3 02/21/2014 1009   PROT 6.6 09/01/2012 1610   ALBUMIN 4.3 09/01/2012 1610   AST 18 09/01/2012 1610   ALT 19 09/01/2012 1610   ALKPHOS 64 09/01/2012 1610   BILITOT 0.6 09/01/2012 1610   GFRNONAA >90 02/22/2013 0450   GFRAA >90 02/22/2013 0450    No results found for this basename: wbc, hgb, hct, mcv, platelets    Lipid Panel     Component Value Date/Time   CHOL 192 01/04/2012 1320   TRIG 66 01/04/2012 1320   HDL 46 01/04/2012 1320   CHOLHDL 4.2 01/04/2012 1320   VLDL 13 01/04/2012 1320   LDLCALC 133* 01/04/2012 1320    ABG No results found for this basename: phart, pco2, pco2art, po2, po2art, hco3, tco2, acidbasedef, o2sat     Lab Results  Component Value Date   TSH 3.061 02/21/2014   BNP (last 3 results) No results found for this basename: PROBNP,  in the last 8760 hours Cardiac Panel (last 3 results) No results found for this basename: CKTOTAL, CKMB, TROPONINI, RELINDX,  in the last 72 hours   Iron/TIBC/Ferritin No results found for this basename: iron, tibc, ferritin     EKG Orders placed in visit on 02/21/14  . EKG 12-LEAD     Prior Assessment and Plan Problem List as of 03/29/2014     Cardiovascular and Mediastinum   Atrial fibrillation   Last Assessment & Plan   02/21/2014 Office Visit Written 02/21/2014  2:41 PM by Jodelle GrossKathryn M Lawrence, NP     Remains in NSR but has multiple PVC;s, I will check a BMET, TSH and increase his metoprolol to 25 mg 1 1/2 tablet daily. He will return in one month. He is advised to wear his CPAP as directed.       Other   Chronic anticoagulation   Last Assessment & Plan   02/21/2014 Office Visit Written 02/21/2014  2:42 PM by Jodelle GrossKathryn M Lawrence, NP     Continue Xarelto.    Hyperlipidemia   Last Assessment & Plan   08/02/2012 Office Visit Written 08/03/2012  8:12 PM by Kathlen Brunswickobert M Rothbart, MD     Average lipid levels.  In the absence of high risk or known vascular disease, pharmacologic therapy is not necessary.    History of diagnostic tests   Chest pain   Last Assessment & Plan   02/21/2014 Office Visit Written 02/21/2014  2:41 PM by Jodelle GrossKathryn M Lawrence, NP     Denies any symptoms of this. Continue to follow.        Imaging: No results found.

## 2014-03-29 NOTE — Patient Instructions (Signed)
Your physician wants you to follow-up in: 6 months with Dr.Branch You will receive a reminder letter in the mail two months in advance. If you don't receive a letter, please call our office to schedule the follow-up appointment.    Your physician recommends that you continue on your current medications as directed. Please refer to the Current Medication list given to you today.    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

## 2014-04-01 ENCOUNTER — Encounter: Payer: Self-pay | Admitting: *Deleted

## 2014-06-25 ENCOUNTER — Other Ambulatory Visit: Payer: Self-pay | Admitting: Internal Medicine

## 2014-06-25 ENCOUNTER — Other Ambulatory Visit: Payer: Self-pay | Admitting: Adult Health

## 2014-06-25 ENCOUNTER — Telehealth: Payer: Self-pay | Admitting: Internal Medicine

## 2014-06-25 NOTE — Telephone Encounter (Signed)
Received fax refill request  Rx # [id number] Medication:  Xarelto 20 mg tablet Qty 30 Sig:  Take one tablet by mouth every day with dinner Physician:  Ladona Ridgel

## 2014-07-30 ENCOUNTER — Other Ambulatory Visit: Payer: Self-pay

## 2014-07-30 MED ORDER — DOFETILIDE 500 MCG PO CAPS: 500.0000 ug | ORAL_CAPSULE | Freq: Two times a day (BID) | ORAL | Status: DC

## 2014-08-05 ENCOUNTER — Encounter: Payer: Self-pay | Admitting: Cardiology

## 2014-10-04 ENCOUNTER — Ambulatory Visit: Payer: 59 | Admitting: Cardiology

## 2014-10-09 ENCOUNTER — Ambulatory Visit (INDEPENDENT_AMBULATORY_CARE_PROVIDER_SITE_OTHER): Payer: 59 | Admitting: Cardiology

## 2014-10-09 ENCOUNTER — Encounter: Payer: Self-pay | Admitting: Cardiology

## 2014-10-09 VITALS — BP 106/69 | HR 57 | Ht 72.0 in | Wt 321.0 lb

## 2014-10-09 DIAGNOSIS — I4891 Unspecified atrial fibrillation: Secondary | ICD-10-CM

## 2014-10-09 DIAGNOSIS — I5022 Chronic systolic (congestive) heart failure: Secondary | ICD-10-CM

## 2014-10-09 DIAGNOSIS — G4733 Obstructive sleep apnea (adult) (pediatric): Secondary | ICD-10-CM
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
## 2014-10-09 NOTE — Patient Instructions (Signed)
   Your physician has requested that you have an echocardiogram. Echocardiography is a painless test that uses sound waves to create images of your heart. It provides your doctor with information about the size and shape of your heart and how well your heart's chambers and valves are working. This procedure takes approximately one hour. There are no restrictions for this procedure. Office will contact with results via phone or letter.   Continue all current medications. Your physician wants you to follow up in: 6 months.  You will receive a reminder letter in the mail one-two months in advance.  If you don't receive a letter, please call our office to schedule the follow up appointment     

## 2014-10-25 HISTORY — PX: CARDIAC CATHETERIZATION: SHX172

## 2014-10-28 ENCOUNTER — Other Ambulatory Visit (HOSPITAL_COMMUNITY): Payer: 59

## 2014-10-28 ENCOUNTER — Ambulatory Visit (HOSPITAL_COMMUNITY)
Admission: RE | Admit: 2014-10-28 | Discharge: 2014-10-28 | Disposition: A | Discharge status: Home / Self Care | Payer: 59 | Source: Ambulatory Visit | Attending: Cardiology | Admitting: Cardiology

## 2014-10-28 DIAGNOSIS — E785 Hyperlipidemia, unspecified: Secondary | ICD-10-CM | POA: Diagnosis not present

## 2014-10-28 DIAGNOSIS — I5022 Chronic systolic (congestive) heart failure: Secondary | ICD-10-CM | POA: Diagnosis not present

## 2014-10-28 DIAGNOSIS — G4733 Obstructive sleep apnea (adult) (pediatric): Secondary | ICD-10-CM | POA: Diagnosis not present

## 2014-10-28 DIAGNOSIS — I255 Ischemic cardiomyopathy: Secondary | ICD-10-CM | POA: Diagnosis not present

## 2014-10-28 DIAGNOSIS — I517 Cardiomegaly: Secondary | ICD-10-CM | POA: Diagnosis not present

## 2014-10-28 DIAGNOSIS — I059 Rheumatic mitral valve disease, unspecified: Secondary | ICD-10-CM

## 2014-10-28 DIAGNOSIS — I4891 Unspecified atrial fibrillation: Secondary | ICD-10-CM | POA: Diagnosis not present

## 2014-10-28 DIAGNOSIS — I340 Nonrheumatic mitral (valve) insufficiency: Secondary | ICD-10-CM | POA: Diagnosis not present

## 2014-10-28 NOTE — Progress Notes (Signed)
  Echocardiogram 2D Echocardiogram has been performed.  Tedd Cottrill 10/28/2014, 9:24 AM

## 2014-11-04 ENCOUNTER — Telehealth: Payer: Self-pay | Admitting: *Deleted

## 2014-11-04 NOTE — Telephone Encounter (Signed)
-----   Message from Antoine Poche, MD sent at 10/31/2014  9:55 AM EST ----- Heart function has gotten weaker based on recent ultrasound, I'd like to see him in 2-3 weeks to discuss further. Can put him in [phone] slot if needed  Dominga Ferry MD

## 2014-11-04 NOTE — Telephone Encounter (Signed)
Pt made aware, will come in on Friday 1/15. Forwarded to Dr. Juanetta Gosling

## 2014-11-08 ENCOUNTER — Telehealth: Payer: Self-pay | Admitting: Cardiology

## 2014-11-08 ENCOUNTER — Encounter: Payer: Self-pay | Admitting: *Deleted

## 2014-11-08 ENCOUNTER — Encounter: Payer: Self-pay | Admitting: Cardiology

## 2014-11-08 ENCOUNTER — Ambulatory Visit (INDEPENDENT_AMBULATORY_CARE_PROVIDER_SITE_OTHER): Payer: 59 | Admitting: Cardiology

## 2014-11-08 ENCOUNTER — Other Ambulatory Visit: Payer: Self-pay | Admitting: Cardiology

## 2014-11-08 VITALS — BP 101/67 | HR 55 | Ht 72.0 in | Wt 320.0 lb

## 2014-11-08 DIAGNOSIS — I4891 Unspecified atrial fibrillation: Secondary | ICD-10-CM

## 2014-11-08 DIAGNOSIS — I5022 Chronic systolic (congestive) heart failure: Secondary | ICD-10-CM

## 2014-11-08 DIAGNOSIS — Z01818 Encounter for other preprocedural examination: Secondary | ICD-10-CM

## 2014-11-08 LAB — CBC
HCT: 43.3 % (ref 39.0–52.0)
Hemoglobin: 14.8 g/dL (ref 13.0–17.0)
MCH: 29.5 pg (ref 26.0–34.0)
MCHC: 34.2 g/dL (ref 30.0–36.0)
MCV: 86.4 fL (ref 78.0–100.0)
MPV: 10.8 fL (ref 8.6–12.4)
Platelets: 189 10*3/uL (ref 150–400)
RBC: 5.01 MIL/uL (ref 4.22–5.81)
RDW: 13.4 % (ref 11.5–15.5)
WBC: 6.8 10*3/uL (ref 4.0–10.5)

## 2014-11-08 LAB — BASIC METABOLIC PANEL
BUN: 14 mg/dL (ref 6–23)
CO2: 27 mEq/L (ref 19–32)
Calcium: 9.5 mg/dL (ref 8.4–10.5)
Chloride: 101 mEq/L (ref 96–112)
Creat: 0.79 mg/dL (ref 0.50–1.35)
Glucose, Bld: 85 mg/dL (ref 70–99)
Potassium: 4.7 mEq/L (ref 3.5–5.3)
Sodium: 136 mEq/L (ref 135–145)

## 2014-11-08 MED ORDER — LISINOPRIL 2.5 MG PO TABS: 2.5000 mg | ORAL_TABLET | Freq: Every day | ORAL | Status: DC

## 2014-11-08 NOTE — Patient Instructions (Signed)
Your physician recommends that you schedule a follow-up appointment in: 3-4 weeks with Dr. Wyline Mood  Your physician has recommended you make the following change in your medication:   START LISINOPRIL 2.5 MG DAILY  PLEASE HOLD XARELTO 2 DAYS BEFORE YOUR CATH  Your physician has requested that you have a cardiac catheterization. Cardiac catheterization is used to diagnose and/or treat various heart conditions. Doctors may recommend this procedure for a number of different reasons. The most common reason is to evaluate chest pain. Chest pain can be a symptom of coronary artery disease (CAD), and cardiac catheterization can show whether plaque is narrowing or blocking your heart's arteries. This procedure is also used to evaluate the valves, as well as measure the blood flow and oxygen levels in different parts of your heart. For further information please visit [email or web address] Please follow instruction sheet, as given.  Your physician recommends that you return for lab work in: 2 WEEKS BMP  Thank you for choosing [facility]!!

## 2014-11-08 NOTE — Telephone Encounter (Signed)
Heart cath 19th at 7am
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
## 2014-11-08 NOTE — Telephone Encounter (Signed)
Per

## 2014-11-09 LAB — APTT: aPTT: 31 seconds (ref 24–37)

## 2014-11-11 NOTE — Telephone Encounter (Signed)
Pt made aware, forwarded to Dr. Hawkins 

## 2014-11-11 NOTE — Telephone Encounter (Signed)
-----   Message from Antoine Poche, MD sent at 11/11/2014  1:20 PM EST ----- Labs look good  Dominga Ferry MD

## 2014-11-12 ENCOUNTER — Encounter (HOSPITAL_COMMUNITY): Payer: Self-pay | Admitting: Cardiology

## 2014-11-12 ENCOUNTER — Encounter (HOSPITAL_COMMUNITY)
Admission: RE | Disposition: A | Discharge status: Home / Self Care | Payer: Self-pay | Source: Ambulatory Visit | Attending: Cardiology

## 2014-11-12 ENCOUNTER — Ambulatory Visit (HOSPITAL_COMMUNITY)
Admission: RE | Admit: 2014-11-12 | Discharge: 2014-11-12 | Disposition: A | Discharge status: Home / Self Care | Payer: 59 | Source: Ambulatory Visit | Attending: Cardiology | Admitting: Cardiology

## 2014-11-12 DIAGNOSIS — I5040 Unspecified combined systolic (congestive) and diastolic (congestive) heart failure: Secondary | ICD-10-CM | POA: Diagnosis present

## 2014-11-12 DIAGNOSIS — I428 Other cardiomyopathies: Secondary | ICD-10-CM | POA: Diagnosis present

## 2014-11-12 DIAGNOSIS — E785 Hyperlipidemia, unspecified: Secondary | ICD-10-CM | POA: Diagnosis present

## 2014-11-12 DIAGNOSIS — G4730 Sleep apnea, unspecified: Secondary | ICD-10-CM | POA: Diagnosis present

## 2014-11-12 DIAGNOSIS — Z951 Presence of aortocoronary bypass graft: Secondary | ICD-10-CM | POA: Insufficient documentation

## 2014-11-12 DIAGNOSIS — I4891 Unspecified atrial fibrillation: Secondary | ICD-10-CM | POA: Diagnosis present

## 2014-11-12 DIAGNOSIS — I420 Dilated cardiomyopathy: Secondary | ICD-10-CM | POA: Diagnosis present

## 2014-11-12 DIAGNOSIS — G4733 Obstructive sleep apnea (adult) (pediatric): Secondary | ICD-10-CM | POA: Diagnosis not present

## 2014-11-12 DIAGNOSIS — I272 Other secondary pulmonary hypertension: Secondary | ICD-10-CM | POA: Diagnosis not present

## 2014-11-12 DIAGNOSIS — I5042 Chronic combined systolic (congestive) and diastolic (congestive) heart failure: Secondary | ICD-10-CM | POA: Diagnosis not present

## 2014-11-12 DIAGNOSIS — Z7901 Long term (current) use of anticoagulants: Secondary | ICD-10-CM | POA: Diagnosis not present

## 2014-11-12 DIAGNOSIS — Z8249 Family history of ischemic heart disease and other diseases of the circulatory system: Secondary | ICD-10-CM | POA: Diagnosis not present

## 2014-11-12 DIAGNOSIS — I480 Paroxysmal atrial fibrillation: Secondary | ICD-10-CM | POA: Diagnosis not present

## 2014-11-12 DIAGNOSIS — Z9989 Dependence on other enabling machines and devices: Secondary | ICD-10-CM

## 2014-11-12 HISTORY — DX: Obstructive sleep apnea (adult) (pediatric): Z99.89

## 2014-11-12 HISTORY — PX: LEFT AND RIGHT HEART CATHETERIZATION WITH CORONARY ANGIOGRAM: SHX5449

## 2014-11-12 HISTORY — DX: Morbid (severe) obesity due to excess calories: E66.01

## 2014-11-12 HISTORY — DX: Other cardiomyopathies: I42.8

## 2014-11-12 HISTORY — DX: Obstructive sleep apnea (adult) (pediatric): G47.33

## 2014-11-12 LAB — POCT I-STAT 3, VENOUS BLOOD GAS (G3P V)
Acid-base deficit: 2 mmol/L (ref 0.0–2.0)
Bicarbonate: 23.6 mEq/L (ref 20.0–24.0)
O2 Saturation: 69 %
TCO2: 25 mmol/L (ref 0–100)
pCO2, Ven: 42.8 mmHg — ABNORMAL LOW (ref 45.0–50.0)
pH, Ven: 7.35 — ABNORMAL HIGH (ref 7.250–7.300)
pO2, Ven: 38 mmHg (ref 30.0–45.0)

## 2014-11-12 LAB — POCT I-STAT 3, ART BLOOD GAS (G3+)
Acid-base deficit: 2 mmol/L (ref 0.0–2.0)
Bicarbonate: 23 mEq/L (ref 20.0–24.0)
O2 Saturation: 98 %
TCO2: 24 mmol/L (ref 0–100)
pCO2 arterial: 40.6 mmHg (ref 35.0–45.0)
pH, Arterial: 7.361 (ref 7.350–7.450)
pO2, Arterial: 111 mmHg — ABNORMAL HIGH (ref 80.0–100.0)

## 2014-11-12 SURGERY — LEFT AND RIGHT HEART CATHETERIZATION WITH CORONARY ANGIOGRAM
Anesthesia: LOCAL

## 2014-11-12 MED ORDER — ASPIRIN 81 MG PO CHEW
81.0000 mg | CHEWABLE_TABLET | ORAL | Status: AC
  Administered 2014-11-12: 81 mg via ORAL

## 2014-11-12 MED ORDER — LIDOCAINE HCL (PF) 1 % IJ SOLN
INTRAMUSCULAR | Status: AC
  Filled 2014-11-12: qty 30

## 2014-11-12 MED ORDER — MIDAZOLAM HCL 2 MG/2ML IJ SOLN
INTRAMUSCULAR | Status: AC
  Filled 2014-11-12: qty 2

## 2014-11-12 MED ORDER — SODIUM CHLORIDE 0.9 % IV SOLN: 1.0000 mL/kg/h | INTRAVENOUS | Status: DC

## 2014-11-12 MED ORDER — SODIUM CHLORIDE 0.9 % IJ SOLN: 3.0000 mL | INTRAMUSCULAR | Status: DC | PRN

## 2014-11-12 MED ORDER — MORPHINE SULFATE 2 MG/ML IJ SOLN: 2.0000 mg | INTRAMUSCULAR | Status: DC | PRN

## 2014-11-12 MED ORDER — SODIUM CHLORIDE 0.9 % IJ SOLN: 3.0000 mL | Freq: Two times a day (BID) | INTRAMUSCULAR | Status: DC

## 2014-11-12 MED ORDER — ASPIRIN 81 MG PO CHEW
CHEWABLE_TABLET | ORAL | Status: AC
  Filled 2014-11-12: qty 1

## 2014-11-12 MED ORDER — VERAPAMIL HCL 2.5 MG/ML IV SOLN
INTRAVENOUS | Status: AC
  Filled 2014-11-12: qty 2

## 2014-11-12 MED ORDER — NITROGLYCERIN 1 MG/10 ML FOR IR/CATH LAB
INTRA_ARTERIAL | Status: AC
  Filled 2014-11-12: qty 10

## 2014-11-12 MED ORDER — SODIUM CHLORIDE 0.9 % IV SOLN: 250.0000 mL | INTRAVENOUS | Status: DC | PRN

## 2014-11-12 MED ORDER — ACETAMINOPHEN 325 MG PO TABS: 650.0000 mg | ORAL_TABLET | ORAL | Status: DC | PRN

## 2014-11-12 MED ORDER — HEPARIN (PORCINE) IN NACL 2-0.9 UNIT/ML-% IJ SOLN
INTRAMUSCULAR | Status: AC
  Filled 2014-11-12: qty 1500

## 2014-11-12 MED ORDER — ONDANSETRON HCL 4 MG/2ML IJ SOLN: 4.0000 mg | Freq: Four times a day (QID) | INTRAMUSCULAR | Status: DC | PRN

## 2014-11-12 MED ORDER — FENTANYL CITRATE 0.05 MG/ML IJ SOLN
INTRAMUSCULAR | Status: AC
  Filled 2014-11-12: qty 2

## 2014-11-12 MED ORDER — SODIUM CHLORIDE 0.9 % IV SOLN: 1.0000 mL/kg/h | INTRAVENOUS | Status: AC

## 2014-11-12 MED ORDER — HEPARIN SODIUM (PORCINE) 1000 UNIT/ML IJ SOLN
INTRAMUSCULAR | Status: AC
  Filled 2014-11-12: qty 1

## 2014-11-12 MED ORDER — SODIUM CHLORIDE 0.9 % IV SOLN
INTRAVENOUS | Status: DC
Start: 2014-11-12 — End: 2014-11-12
  Administered 2014-11-12: 08:00:00 via INTRAVENOUS
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
## 2014-11-12 NOTE — Interval H&P Note (Signed)
History and Physical Interval Note:  11/12/2014 8:23 AM  [name]  has presented today for surgery, with the diagnosis of Worsening Combined Systolic & Diastolic HF The various methods of treatment have been discussed with the patient and family. After consideration of risks, benefits and other options for treatment, the patient has consented to  Procedure(s): LEFT AND RIGHT HEART CATHETERIZATION WITH CORONARY ANGIOGRAM (N/A) as a surgical intervention .  The patient's history has been reviewed, patient examined, no change in status, stable for surgery.  I have reviewed the patient's chart and labs.  Questions were answered to the patient's satisfaction.    Cath Lab Visit (complete for each Cath Lab visit)  Clinical Evaluation Leading to the Procedure:   ACS: No.  Non-ACS:    Anginal Classification: CCS III - dyspnea  Anti-ischemic medical therapy: Minimal Therapy (1 class of medications)  Non-Invasive Test Results: Equivocal test results - Echo EF worse  Prior CABG: No previous CABG  HARDING, DAVID W

## 2014-11-12 NOTE — CV Procedure (Signed)
CARDIAC CATHETERIZATION REPORT  NAME:  Brandon Torres   MRN: [id number] DOB:  [birth year]   ADMIT DATE: 11/12/2014 Procedure Date: 11/12/2014  INTERVENTIONAL CARDIOLOGIST: Marykay Lex, M.D., MS PRIMARY CARE PROVIDER: Fredirick Maudlin, MD PRIMARY CARDIOLOGIST: Dina Rich, MD  PATIENT:  Brandon Torres is a 49 y.o. male with long-standing NICM who was seen by Dr. Wyline Mood for worsening CHF Sx.  Echo EF showed drop in EF from ~40% to ~15%.  He is referred for R&LHC>  PRE-OPERATIVE DIAGNOSIS:    Class II-III Combined Systolic & Diastolic Heart Failure  Worsening Cardiomyopathy  PROCEDURES PERFORMED:    Right & Left Heart Catheterization with Native Coronary Angiography  via  Right Common Femoral Artery & Right Common Femoral Vein Access.  Left Ventriculography  PROCEDURE: The patient was brought to the 2nd Floor [facility] in the fasting state and prepped and draped in the usual sterile fashion for right femoral arterial or radial access. A modified Allen's test was performed on the right wrist demonstrating excellent collateral flow. Sterile technique was used including antiseptics, cap, gloves, gown, hand hygiene, mask and sheet. Skin prep: Chlorhexidine.   Consent: Risks of procedure as well as the alternatives and risks of each were explained to the (patient/caregiver). Consent for procedure obtained.   Time Out: Verified patient identification, verified procedure, site/side was marked, verified correct patient position, special equipment/implants available, medications/allergies/relevent history reviewed, required imaging and test results available. Performed.  Access:  Unable to advance wire into Brachial IV - so femoral access chosen.  Unable to acces Right Radial Artery -- converted to femoral RIGHT Common Femoral Artery: 5 Fr Sheath - fluoroscopically guided modified Seldinger Technique  RIGHT Common Femoral Vein: 7 Fr Sheath - Seldinger  Technique..  Right Heart Catheterization: 7 Fr Theone Murdoch catheter advanced under fluoroscopy with balloon inflated to the RA, RV, then PCWP-PA for hemodynamic measurement.  Simultaneous FA & PA blood gases checked for SaO2% to calculate FICK CO/CI  Thermodilution Injections performed to calculate CO/CI  Simultaneous PCWP/LV & RV/LV pressures monitored with Angled Pigtail in LV.  Catheter removed completely out of the body with balloon deflated.  Left Heart Catheterization: 5Fr Catheters advanced or exchanged over a J-wire; Angled Pitgtail catheter advanced first.  LV Hemodynamics (LV Gram): Angled Pigtail Left Coronary Artery Cineangiography: JL4 Catheter  Right Coronary Artery Cineangiography: JR4 Catheter   Sheath removed in the holding area with VASCADE closure device deployed for Arteriotomy hemostasis followed by manual pressure for venous sheath removal.  FINDINGS:  Hemodynamics:  Findings:   SaO2%  Pressures mmHg  Mean P  mmHg  EDP  mmHg   Right Atrium    10/11    7   Right Ventricle    39/4    13   Pulmonary Artery   69   36/10   21    PCWP    22/29 (large V wave)   19    Central Aortic   98   99/68   82    Left Ventricle    96/4    17          Cardiac Output:   Cardiac Index:    Fick   5.92    2.28    Thermodilution   6.64    2.55     Left Ventriculography:  EF: ~15 %  Wall Motion: Global hypokinesis  Coronary Anatomy: Right dominant  Left Main: Normal Caliber Vessel Trifurcates into the LAD, Ramus Intermedius,  and Circumflex.  Angiographically normal LAD: Normal caliber vessel that gives off a early mid vessel diagonal branch. The vessel itself courses down around the apex. Tapers distally but no significant lesions.  D1: Moderate caliber vessel, covers a large portion of the anterolateral wall area angiographic normal.  Left Circumflex: Normal caliber, nondominant vessel that gives rise to small marginal branch before essentially terminating as a major OM 2  that gives off an AV groove branch that terminates as OM 3/LPL 1.  The major vessel and branches are free of significant disease.  Ramus intermedius: Normal caliber vessel that courses of the high OM. Angiographically normal   RCA: Normal caliber, dominant vessel that bifurcates distally into the Right Posterior Descending Artery (RPDA) and the  Right Posterior AV Groove Branch (RPAV). Angiographically normal.  RPDA: Small moderate caliber vessel that does not reach the apex. Angiographically normal.  RPL Sysytem:The RPAV begins as a moderate caliber vessel that bifurcates into 2 small moderate caliber posterolateral branches. Angiographically normal.  MEDICATIONS:  Anesthesia:  Local Lidocaine 2 ml  Sedation:  2 mg IV Versed, 50 mcg IV fentanyl ;   Omnipaque Contrast: 80 ml  PATIENT DISPOSITION:    The patient was transferred to the PACU holding area in a hemodynamicaly stable, chest pain free condition.  The patient tolerated the procedure well, and there were no complications.  EBL:   < 10 ml  The patient was stable before, during, and after the procedure.  POST-OPERATIVE DIAGNOSIS:    Severe nonischemic cardiomyopathy with angiographic normal coronary arteries.  Only mild secondary pulmonary hypertension with mild to moderately elevated LVEDP.  Notable V wave in wedge pressure waveform would suggest possible mitral regurgitation  Relatively preserved cardiac output, but reduced cardiac index based on Fick and thermal dilution.  PLAN OF CARE:  Transferred to short stay for standard post catheterization care.  Expect discharge following bed rest of 2 hours post closure device.  Follow-up with Dr. Wyline Mood for adjustment and titration of cardiomyopathy medications.   Marykay Lex, M.D., M.S. [facility] [address] [place], [place]  [address]  [phone]  11/12/2014 11:18 AM

## 2014-11-12 NOTE — Progress Notes (Signed)
    49 y/o man with known (presumed) non-ischemic CM (had been stable Class I-II Sx) with improved Echo EF of 40% on med Rx from 2004 p/w worsening exertional dyspnea (Class II-III) & found to have drop in EF to 15-20%.  He is referred for R&L HC for change in clinical status & drop in EF.  Appropriateness of Use Criteria:  For R&L Heart Catheterization:  Cardiomyopathies (Right and Left Heart Catheterization OR  Right Heart Catheterization Alone With/Without Left Ventriculography and Coronary Angiography)   Patient Information:    Re-evaluation of known cardiomyopathy   Change in clinical status or cardiac exam or to guide therapy  AUC Score:   A (7)   Indication:   94  For PCI: marked limitation of activity  Ischemic Symptoms? CCS III (Marked limitation of ordinary activity) Anti-ischemic Medical Therapy? Minimal Therapy (1 class of medications) Non-invasive Test Results? Equivocal test results Prior CABG? No Previous CABG   Patient Information:   1-2V-CAD with DS 50-60% With FFR  A (7)  Indication: 22; Score: 7   Patient Information:   1-2V-CAD with DS 50-60% With FFR>0.8, IVUS not significant  I (2)  Indication: 23; Score: 2   Patient Information:   3V-CAD without LMCA With Abnormal LV systolic function  A (9)  Indication: 48; Score: 9   Patient Information:   LMCA-CAD  A (9)  Indication: 49; Score: 9   Patient Information:   2V-CAD with prox LAD PCI  A (7)  Indication: 62; Score: 7   Patient Information:   2V-CAD with prox LAD CABG  A (8)  Indication: 62; Score: 8   Patient Information:   3V-CAD without LMCA With Low CAD burden(i.e., 3 focal stenoses, low SYNTAX score) PCI  A (7)  Indication: 63; Score: 7   Patient Information:   3V-CAD without LMCA With Low CAD burden(i.e., 3 focal stenoses, low SYNTAX score) CABG  A (9)  Indication: 63; Score: 9   Patient Information:   3V-CAD without LMCA E06c - Intermediate-high  CAD burden (i.e., multiple diffuse lesions, presence of CTO, or high SYNTAX score) PCI  U (4)  Indication: 64; Score: 4   Patient Information:   3V-CAD without LMCA E06c - Intermediate-high CAD burden (i.e., multiple diffuse lesions, presence of CTO, or high SYNTAX score) CABG  A (9)  Indication: 64; Score: 9   Patient Information:   LMCA-CAD With Isolated LMCA stenosis  PCI  U (6)  Indication: 65; Score: 6   Patient Information:   LMCA-CAD With Isolated LMCA stenosis  CABG  A (9)  Indication: 65; Score: 9   Patient Information:   LMCA-CAD Additional CAD, intermediate-high CAD burden (i.e., 3-vessel involvement, presence of CTO, or high SYNTAX score) PCI  U (5)  Indication: 66; Score: 5   Patient Information:   LMCA-CAD Additional CAD, low CAD burden (i.e., 1- to 2-vessel additional involvement, low SYNTAX score) PCI  U (5)  Indication: 66; Score: 5   Patient Information:   LMCA-CAD Additional CAD, low CAD burden (i.e., 1- to 2-vessel additional involvement, low SYNTAX score) CABG  A (9)  Indication: 66; Score: 9   Patient Information:   LMCA-CAD Additional CAD, intermediate-high CAD burden (i.e., 3-vessel involvement, presence of CTO, or high SYNTAX score) CABG  A (9)  Indication: 67; Score: 9   Medard Decuir W, M.D., M.S. Interventional Cardiologist   Pager # [phone]

## 2014-11-12 NOTE — Discharge Instructions (Signed)
° ° °  Angiogram, Care After Refer to this sheet in the next few weeks. These instructions provide you with information on caring for yourself after your procedure. Your health care provider may also give you more specific instructions. Your treatment has been planned according to current medical practices, but problems sometimes occur. Call your health care provider if you have any problems or questions after your procedure.  WHAT TO EXPECT AFTER THE PROCEDURE After your procedure, it is typical to have the following sensations:  Minor discomfort or tenderness and a small bump at the catheter insertion site. The bump should usually decrease in size and tenderness within 1 to 2 weeks.  Any bruising will usually fade within 2 to 4 weeks. HOME CARE INSTRUCTIONS   You may need to keep taking blood thinners if they were prescribed for you. Take medicines only as directed by your health care provider.  Do not apply powder or lotion to the site.  Do not take baths, swim, or use a hot tub until your health care provider approves.  You may shower 24 hours after the procedure. Remove the bandage (dressing) and gently wash the site with plain soap and water. Gently pat the site dry.  Inspect the site at least twice daily.  Limit your activity for the first 48 hours. Do not bend, squat, or lift anything over 20 lb (9 kg) or as directed by your health care provider.  Plan to have someone take you home after the procedure. Follow instructions about when you can drive or return to work. SEEK MEDICAL CARE IF:  You get light-headed when standing up.  You have drainage (other than a small amount of blood on the dressing).  You have chills.  You have a fever.  You have redness, warmth, swelling, or pain at the insertion site. SEEK IMMEDIATE MEDICAL CARE IF:   You develop chest pain or shortness of breath, feel faint, or pass out.  You have bleeding, swelling larger than a walnut, or drainage from  the catheter insertion site.  You develop pain, discoloration, coldness, or severe bruising in the leg or arm that held the catheter.  You develop bleeding from any other place, such as the bowels. You may see bright red blood in your urine or stools, or your stools may appear black and tarry.  You have heavy bleeding from the site. If this happens, hold pressure on the site. CAll 911 MAKE SURE YOU:  Understand these instructions.  Will watch your condition.  Will get help right away if you are not doing well or get worse. Document Released: 04/29/2005 Document Revised: 02/25/2014 Document Reviewed: 03/05/2013 [facility] Patient Information 2015 [address], [place]. This information is not intended to replace advice given to you by your health care provider. Make sure you discuss any questions you have with your health care provider.             Standard post cath groin precautions per RN instructions.  With new Dx of severe cardiomyopathy - OK to return to work [facility] 11/18/2014.  Marykay Lex, MD

## 2014-11-21 ENCOUNTER — Ambulatory Visit (INDEPENDENT_AMBULATORY_CARE_PROVIDER_SITE_OTHER): Payer: 59 | Admitting: *Deleted

## 2014-11-21 VITALS — BP 118/68 | HR 54

## 2014-11-21 DIAGNOSIS — I420 Dilated cardiomyopathy: Secondary | ICD-10-CM

## 2014-11-21 DIAGNOSIS — I4891 Unspecified atrial fibrillation: Secondary | ICD-10-CM

## 2014-11-21 NOTE — Progress Notes (Signed)
Was this a bp check? Vital signs look good, will discuss further at next f/u   Dominga Ferry MD

## 2014-11-21 NOTE — Progress Notes (Signed)
Recently started Lisinopril 2.5mg  daily - tolerating well.  Has eph scheduled 11/29/14 with Dr. Wyline Mood.

## 2014-11-29 ENCOUNTER — Encounter: Payer: Self-pay | Admitting: Cardiology

## 2014-11-29 ENCOUNTER — Ambulatory Visit (INDEPENDENT_AMBULATORY_CARE_PROVIDER_SITE_OTHER): Payer: 59 | Admitting: Cardiology

## 2014-11-29 VITALS — BP 116/81 | HR 50 | Ht 72.0 in | Wt 315.4 lb

## 2014-11-29 DIAGNOSIS — I4891 Unspecified atrial fibrillation: Secondary | ICD-10-CM

## 2014-11-29 DIAGNOSIS — I420 Dilated cardiomyopathy: Secondary | ICD-10-CM

## 2014-11-29 DIAGNOSIS — I5040 Unspecified combined systolic (congestive) and diastolic (congestive) heart failure: Secondary | ICD-10-CM

## 2014-11-29 MED ORDER — LISINOPRIL 5 MG PO TABS: 5.0000 mg | ORAL_TABLET | Freq: Every day | ORAL | Status: DC
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
## 2014-11-29 NOTE — Patient Instructions (Signed)
Your physician recommends that you schedule a follow-up appointment in: 3-4 week with Dr. Wyline Mood  Your physician has recommended you make the following change in your medication:   INCREASE LISINOPRIL 5 MG DAILY  Your physician recommends that you return for lab work IN 2 WEEKS BMP/MAG/TSH  Thank you for choosing [facility]!!

## 2014-12-18 ENCOUNTER — Other Ambulatory Visit: Payer: Self-pay | Admitting: Cardiology

## 2014-12-18 LAB — BASIC METABOLIC PANEL
BUN: 13 mg/dL (ref 6–23)
CO2: 26 mEq/L (ref 19–32)
Calcium: 9.3 mg/dL (ref 8.4–10.5)
Chloride: 104 mEq/L (ref 96–112)
Creat: 0.87 mg/dL (ref 0.50–1.35)
Glucose, Bld: 98 mg/dL (ref 70–99)
Potassium: 4.4 mEq/L (ref 3.5–5.3)
Sodium: 139 mEq/L (ref 135–145)

## 2014-12-18 LAB — TSH: TSH: 1.96 u[IU]/mL (ref 0.350–4.500)

## 2014-12-18 LAB — MAGNESIUM: Magnesium: 1.9 mg/dL (ref 1.5–2.5)

## 2014-12-23 ENCOUNTER — Ambulatory Visit (INDEPENDENT_AMBULATORY_CARE_PROVIDER_SITE_OTHER): Payer: 59 | Admitting: Cardiology

## 2014-12-23 ENCOUNTER — Encounter: Payer: Self-pay | Admitting: Cardiology

## 2014-12-23 VITALS — BP 110/72 | HR 52 | Ht 72.0 in | Wt 312.0 lb

## 2014-12-23 DIAGNOSIS — I4891 Unspecified atrial fibrillation: Secondary | ICD-10-CM

## 2014-12-23 DIAGNOSIS — I5022 Chronic systolic (congestive) heart failure: Secondary | ICD-10-CM

## 2014-12-23 MED ORDER — LISINOPRIL 10 MG PO TABS: 10.0000 mg | ORAL_TABLET | Freq: Every day | ORAL | Status: DC

## 2014-12-23 NOTE — Patient Instructions (Signed)
   Increase Lisinopril to 10mg  daily - new sent to pharmacy today. Continue all other medications.   Follow up in  3-4 weeks
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
## 2015-01-14 ENCOUNTER — Encounter: Payer: Self-pay | Admitting: Cardiology

## 2015-01-14 ENCOUNTER — Ambulatory Visit (INDEPENDENT_AMBULATORY_CARE_PROVIDER_SITE_OTHER): Payer: 59 | Admitting: Cardiology

## 2015-01-14 VITALS — BP 102/69 | HR 60 | Ht 72.0 in | Wt 311.4 lb

## 2015-01-14 DIAGNOSIS — G4733 Obstructive sleep apnea (adult) (pediatric): Secondary | ICD-10-CM | POA: Diagnosis not present

## 2015-01-14 DIAGNOSIS — I4891 Unspecified atrial fibrillation: Secondary | ICD-10-CM

## 2015-01-14 DIAGNOSIS — I5022 Chronic systolic (congestive) heart failure: Secondary | ICD-10-CM
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
## 2015-01-14 NOTE — Patient Instructions (Addendum)
Continue all current medications. Follow up in  2 months  

## 2015-01-20 ENCOUNTER — Other Ambulatory Visit: Payer: Self-pay | Admitting: Internal Medicine

## 2015-01-20 ENCOUNTER — Other Ambulatory Visit: Payer: Self-pay | Admitting: Adult Health

## 2015-01-21 ENCOUNTER — Encounter: Payer: Self-pay | Admitting: *Deleted

## 2015-01-21 ENCOUNTER — Other Ambulatory Visit: Payer: Self-pay | Admitting: *Deleted

## 2015-01-21 DIAGNOSIS — I480 Paroxysmal atrial fibrillation: Secondary | ICD-10-CM | POA: Insufficient documentation

## 2015-01-21 DIAGNOSIS — I429 Cardiomyopathy, unspecified: Secondary | ICD-10-CM | POA: Insufficient documentation

## 2015-01-21 DIAGNOSIS — I428 Other cardiomyopathies: Secondary | ICD-10-CM | POA: Insufficient documentation

## 2015-01-21 DIAGNOSIS — E668 Other obesity: Secondary | ICD-10-CM | POA: Insufficient documentation

## 2015-02-07 ENCOUNTER — Encounter: Payer: Self-pay | Admitting: Cardiology

## 2015-02-07 ENCOUNTER — Ambulatory Visit (INDEPENDENT_AMBULATORY_CARE_PROVIDER_SITE_OTHER): Payer: 59 | Admitting: Cardiology

## 2015-02-07 VITALS — BP 101/69 | HR 58 | Ht 72.0 in | Wt 315.1 lb

## 2015-02-07 DIAGNOSIS — I4891 Unspecified atrial fibrillation: Secondary | ICD-10-CM | POA: Diagnosis not present

## 2015-02-07 DIAGNOSIS — I5022 Chronic systolic (congestive) heart failure: Secondary | ICD-10-CM

## 2015-02-07 MED ORDER — METOPROLOL SUCCINATE ER 25 MG PO TB24: 25.0000 mg | ORAL_TABLET | Freq: Two times a day (BID) | ORAL | Status: DC

## 2015-02-07 MED ORDER — FUROSEMIDE 20 MG PO TABS: 20.0000 mg | ORAL_TABLET | Freq: Every day | ORAL | Status: DC

## 2015-02-07 NOTE — Patient Instructions (Signed)
Your physician recommends that you schedule a follow-up appointment in: 1 MONTH WITH DR. BRANCH  Your physician has recommended you make the following change in your medication:   INCREASE TOPROL XL 25 MG TWICE DAILY  START LASIX 20 MG DAILY AS NEEDED FOR SWELLING  CONTINUE ALL OTHER MEDICATIONS AS DIRECTED  Thank you for choosing [facility]!!
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
## 2015-02-21 ENCOUNTER — Other Ambulatory Visit: Payer: Self-pay | Admitting: Internal Medicine

## 2015-02-28 ENCOUNTER — Encounter: Payer: Self-pay | Admitting: Cardiology

## 2015-02-28 ENCOUNTER — Ambulatory Visit (INDEPENDENT_AMBULATORY_CARE_PROVIDER_SITE_OTHER): Payer: 59 | Admitting: Cardiology

## 2015-02-28 VITALS — BP 100/63 | HR 47 | Ht 72.0 in | Wt 314.0 lb

## 2015-02-28 DIAGNOSIS — I5022 Chronic systolic (congestive) heart failure: Secondary | ICD-10-CM | POA: Diagnosis not present

## 2015-02-28 NOTE — Patient Instructions (Signed)
Your physician has requested that you have an echocardiogram. Echocardiography is a painless test that uses sound waves to create images of your heart. It provides your doctor with information about the size and shape of your heart and how well your heart's chambers and valves are working. This procedure takes approximately one hour. There are no restrictions for this procedure. Office will contact with results via phone or letter.   Continue all current medications. Follow up in  3 months   
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
## 2015-03-13 ENCOUNTER — Ambulatory Visit (INDEPENDENT_AMBULATORY_CARE_PROVIDER_SITE_OTHER): Payer: 59

## 2015-03-13 ENCOUNTER — Other Ambulatory Visit: Payer: Self-pay

## 2015-03-13 DIAGNOSIS — I5022 Chronic systolic (congestive) heart failure: Secondary | ICD-10-CM

## 2015-03-14 ENCOUNTER — Telehealth: Payer: Self-pay | Admitting: *Deleted

## 2015-03-14 NOTE — Telephone Encounter (Signed)
-----   Message from Antoine Poche, MD sent at 03/14/2015 11:31 AM EDT ----- Patient's heart function remains weak at 20-25% (normal is 50-60%), and he will need to be considered for an ICD. He has been seen previously by Dr Ladona Ridgel, please set up a follow up appointment. I know he had many questions about ICD and his job, these will be best to discuss when he sees Dr Layla Maw MD

## 2015-03-14 NOTE — Telephone Encounter (Signed)
Pt made aware, scheduled for DR. Ladona Ridgel in the [address] office 03/26/15 @ 3:15. Will forward to Dr. Wyline Mood as Lorain Childes

## 2015-03-14 NOTE — Telephone Encounter (Signed)
-----   Message from Jonathan F Branch, MD sent at 03/14/2015 11:31 AM EDT ----- Patient's heart function remains weak at 20-25% (normal is 50-60%), and he will need to be considered for an ICD. He has been seen previously by Dr Taylor, please set up a follow up appointment. I know he had many questions about ICD and his job, these will be best to discuss when he sees Dr Taylor  J Branch MD 

## 2015-03-18 ENCOUNTER — Ambulatory Visit: Payer: 59 | Admitting: Cardiology

## 2015-03-26 ENCOUNTER — Ambulatory Visit (INDEPENDENT_AMBULATORY_CARE_PROVIDER_SITE_OTHER): Payer: 59 | Admitting: Internal Medicine

## 2015-03-26 ENCOUNTER — Encounter: Payer: Self-pay | Admitting: Internal Medicine

## 2015-03-26 VITALS — BP 100/70 | HR 61 | Ht 72.0 in | Wt 318.0 lb

## 2015-03-26 DIAGNOSIS — I420 Dilated cardiomyopathy: Secondary | ICD-10-CM

## 2015-03-26 DIAGNOSIS — Z01812 Encounter for preprocedural laboratory examination: Secondary | ICD-10-CM

## 2015-03-26 DIAGNOSIS — I4891 Unspecified atrial fibrillation: Secondary | ICD-10-CM

## 2015-03-26 DIAGNOSIS — I5040 Unspecified combined systolic (congestive) and diastolic (congestive) heart failure: Secondary | ICD-10-CM

## 2015-03-26 DIAGNOSIS — Z7901 Long term (current) use of anticoagulants: Secondary | ICD-10-CM

## 2015-03-26 DIAGNOSIS — I480 Paroxysmal atrial fibrillation: Secondary | ICD-10-CM

## 2015-03-26 NOTE — Patient Instructions (Addendum)
Medication Instructions:  Your physician recommends that you continue on your current medications as directed. Please refer to the Current Medication list given to you today.   Labwork: Your physician recommends that you return for lab work in: 6/22 at the [facility]    Testing/Procedures: NONE  Follow-Up: Your physician recommends that you schedule a follow-up appointment in: 10-14 days from 6/29 in Device [facility]  Your physician recommends that you schedule a follow-up appointment in: 3 months with Dr. Ladona Ridgel.   Any Other Special Instructions Will Be Listed Below (If Applicable).  I will call you with the details of your procedure.

## 2015-03-26 NOTE — Assessment & Plan Note (Signed)
Once his device is in place, will work on plans for weight loss.

## 2015-03-26 NOTE — Assessment & Plan Note (Signed)
Despite his multiple risk factors for recurrent atrial fibrillation, he appears to be maintaining NSR.

## 2015-03-26 NOTE — Assessment & Plan Note (Signed)
His symptoms are class 2. He will continue his current meds. We discussed the indications for ICD implant and he wishes to proceed. He is encouraged to maintain a low sodium diet.
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
## 2015-03-26 NOTE — Assessment & Plan Note (Signed)
Will plan on holding his Xarelto for a day prior to surgery.

## 2015-04-04 ENCOUNTER — Telehealth: Payer: Self-pay | Admitting: Cardiology

## 2015-04-04 NOTE — Telephone Encounter (Signed)
Will forward to Dr. Branch. 

## 2015-04-04 NOTE — Telephone Encounter (Signed)
Brandon Torres is having device implant on 04-23-15 with Dr. Ladona Ridgel. Patient is wanting to know if Dr. Wyline Mood could write a note to his employer  To take him out of work`starting next week.  If note is written please fax # [phone] attention: No new problems.  Brandon Torres thinks that her husband may be having  Some anxiety setting in. States that his company is having a lay off.  You may call [name] at [phone].

## 2015-04-07 NOTE — Telephone Encounter (Signed)
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 Wife understands that we may not be able to give work excuse.

## 2015-04-07 NOTE — Telephone Encounter (Signed)
Please clarify what the patient is asking for? Is it for a work excuse for next week only or is he asking for an excuse until his implant. He would have to have significant ongoing symptoms for [name] to be able to provide this for him  Dominga Ferry MD

## 2015-04-16 ENCOUNTER — Other Ambulatory Visit (INDEPENDENT_AMBULATORY_CARE_PROVIDER_SITE_OTHER): Payer: 59 | Admitting: *Deleted

## 2015-04-16 DIAGNOSIS — Z01812 Encounter for preprocedural laboratory examination: Secondary | ICD-10-CM

## 2015-04-16 LAB — BASIC METABOLIC PANEL
BUN: 12 mg/dL (ref 6–23)
CO2: 27 mEq/L (ref 19–32)
Calcium: 9.4 mg/dL (ref 8.4–10.5)
Chloride: 103 mEq/L (ref 96–112)
Creatinine, Ser: 0.88 mg/dL (ref 0.40–1.50)
GFR: 98.48 mL/min (ref >60.00)
Glucose, Bld: 95 mg/dL (ref 70–99)
Potassium: 4.1 mEq/L (ref 3.5–5.1)
Sodium: 138 mEq/L (ref 135–145)

## 2015-04-16 LAB — CBC WITH DIFFERENTIAL/PLATELET
Basophils Absolute: 0 10*3/uL (ref 0.0–0.1)
Basophils Relative: 0.6 % (ref 0.0–3.0)
Eosinophils Absolute: 0.1 10*3/uL (ref 0.0–0.7)
Eosinophils Relative: 1 % (ref 0.0–5.0)
HCT: 43.9 % (ref 39.0–52.0)
Hemoglobin: 14.6 g/dL (ref 13.0–17.0)
Lymphocytes Relative: 19.4 % (ref 12.0–46.0)
Lymphs Abs: 1.2 10*3/uL (ref 0.7–4.0)
MCHC: 33.2 g/dL (ref 30.0–36.0)
MCV: 88.4 fl (ref 78.0–100.0)
Monocytes Absolute: 0.7 10*3/uL (ref 0.1–1.0)
Monocytes Relative: 10.6 % (ref 3.0–12.0)
Neutro Abs: 4.2 10*3/uL (ref 1.4–7.7)
Neutrophils Relative %: 68.4 % (ref 43.0–77.0)
Platelets: 172 10*3/uL (ref 150.0–400.0)
RBC: 4.96 Mil/uL (ref 4.22–5.81)
RDW: 12.9 % (ref 11.5–15.5)
WBC: 6.1 10*3/uL (ref 4.0–10.5)

## 2015-04-16 NOTE — Addendum Note (Signed)
Addended by: Tonita Phoenix on: 04/16/2015 08:29 AM   Modules accepted: Orders

## 2015-04-17 ENCOUNTER — Ambulatory Visit (INDEPENDENT_AMBULATORY_CARE_PROVIDER_SITE_OTHER): Payer: 59 | Admitting: Cardiology

## 2015-04-17 ENCOUNTER — Encounter: Payer: Self-pay | Admitting: Cardiology

## 2015-04-17 ENCOUNTER — Encounter (HOSPITAL_COMMUNITY): Payer: Self-pay | Admitting: Pharmacy Technician

## 2015-04-17 VITALS — BP 98/63 | HR 47 | Ht 72.0 in | Wt 312.0 lb

## 2015-04-17 DIAGNOSIS — I5022 Chronic systolic (congestive) heart failure: Secondary | ICD-10-CM | POA: Diagnosis not present

## 2015-04-17 DIAGNOSIS — I4891 Unspecified atrial fibrillation: Secondary | ICD-10-CM
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
## 2015-04-17 NOTE — Patient Instructions (Signed)
Your physician recommends that you schedule a follow-up appointment in: 6 WEEKS WITH DR. BRANCH  Your physician recommends that you continue on your current medications as directed. Please refer to the Current Medication list given to you today.  WE WILL FAX A WORK NOTE THIS AFTERNOON.  Thank you for choosing [facility]!!

## 2015-04-22 ENCOUNTER — Telehealth: Payer: Self-pay | Admitting: Nurse Practitioner

## 2015-04-22 NOTE — Telephone Encounter (Signed)
Left message on patient's machine to arrive at 7:30AM instead of 6:30AM for ICD implant tomorrow due to schedule change at the hospital.  Gypsy Balsam, NP 04/22/2015 10:32 AM

## 2015-04-23 ENCOUNTER — Encounter (HOSPITAL_COMMUNITY)
Admission: RE | Disposition: A | Discharge status: Home / Self Care | Payer: Self-pay | Source: Ambulatory Visit | Attending: Internal Medicine

## 2015-04-23 ENCOUNTER — Encounter (HOSPITAL_COMMUNITY): Payer: Self-pay | Admitting: General Practice

## 2015-04-23 ENCOUNTER — Ambulatory Visit (HOSPITAL_COMMUNITY)
Admission: RE | Admit: 2015-04-23 | Discharge: 2015-04-24 | Disposition: A | Discharge status: Home / Self Care | Payer: 59 | Source: Ambulatory Visit | Attending: Internal Medicine | Admitting: Internal Medicine

## 2015-04-23 DIAGNOSIS — I429 Cardiomyopathy, unspecified: Secondary | ICD-10-CM | POA: Diagnosis not present

## 2015-04-23 DIAGNOSIS — G4733 Obstructive sleep apnea (adult) (pediatric): Secondary | ICD-10-CM | POA: Insufficient documentation

## 2015-04-23 DIAGNOSIS — Z95810 Presence of automatic (implantable) cardiac defibrillator: Secondary | ICD-10-CM

## 2015-04-23 DIAGNOSIS — I480 Paroxysmal atrial fibrillation: Secondary | ICD-10-CM | POA: Insufficient documentation

## 2015-04-23 DIAGNOSIS — I10 Essential (primary) hypertension: Secondary | ICD-10-CM | POA: Insufficient documentation

## 2015-04-23 DIAGNOSIS — Z6841 Body Mass Index (BMI) 40.0 and over, adult: Secondary | ICD-10-CM | POA: Diagnosis not present

## 2015-04-23 DIAGNOSIS — E785 Hyperlipidemia, unspecified: Secondary | ICD-10-CM | POA: Insufficient documentation

## 2015-04-23 DIAGNOSIS — I509 Heart failure, unspecified: Secondary | ICD-10-CM | POA: Diagnosis not present

## 2015-04-23 HISTORY — PX: CARDIAC DEFIBRILLATOR PLACEMENT: SHX171

## 2015-04-23 HISTORY — DX: Presence of automatic (implantable) cardiac defibrillator: Z95.810

## 2015-04-23 HISTORY — DX: Heart failure, unspecified: I50.9

## 2015-04-23 HISTORY — PX: EP IMPLANTABLE DEVICE: SHX172B

## 2015-04-23 HISTORY — DX: Cardiac arrhythmia, unspecified: I49.9

## 2015-04-23 LAB — SURGICAL PCR SCREEN
MRSA, PCR: NEGATIVE
Staphylococcus aureus: NEGATIVE

## 2015-04-23 SURGERY — ICD IMPLANT

## 2015-04-23 MED ORDER — CEFAZOLIN SODIUM-DEXTROSE 2-3 GM-% IV SOLR
2.0000 g | Freq: Four times a day (QID) | INTRAVENOUS | Status: DC
  Administered 2015-04-23 – 2015-04-24 (×2): 2 g via INTRAVENOUS
  Filled 2015-04-23 (×3): qty 50

## 2015-04-23 MED ORDER — RIVAROXABAN 20 MG PO TABS
20.0000 mg | ORAL_TABLET | Freq: Every day | ORAL | Status: DC
  Administered 2015-04-23: 20 mg via ORAL
  Filled 2015-04-23: qty 1

## 2015-04-23 MED ORDER — LIDOCAINE HCL (PF) 1 % IJ SOLN
INTRAMUSCULAR | Status: DC | PRN
  Administered 2015-04-23: 50 mL

## 2015-04-23 MED ORDER — ONDANSETRON HCL 4 MG/2ML IJ SOLN: 4.0000 mg | Freq: Four times a day (QID) | INTRAMUSCULAR | Status: DC | PRN

## 2015-04-23 MED ORDER — LIDOCAINE HCL (PF) 1 % IJ SOLN
INTRAMUSCULAR | Status: AC
  Filled 2015-04-23: qty 30

## 2015-04-23 MED ORDER — MUPIROCIN 2 % EX OINT
1.0000 "application " | TOPICAL_OINTMENT | Freq: Once | CUTANEOUS | Status: AC
  Administered 2015-04-23: 1 via TOPICAL

## 2015-04-23 MED ORDER — ACETAMINOPHEN 325 MG PO TABS
325.0000 mg | ORAL_TABLET | ORAL | Status: DC | PRN
  Administered 2015-04-23: 650 mg via ORAL
  Administered 2015-04-23: 325 mg via ORAL
  Filled 2015-04-23 (×2): qty 2

## 2015-04-23 MED ORDER — HEPARIN (PORCINE) IN NACL 2-0.9 UNIT/ML-% IJ SOLN
INTRAMUSCULAR | Status: AC
  Filled 2015-04-23: qty 500

## 2015-04-23 MED ORDER — METOPROLOL SUCCINATE ER 25 MG PO TB24
25.0000 mg | ORAL_TABLET | Freq: Two times a day (BID) | ORAL | Status: DC
  Administered 2015-04-23: 25 mg via ORAL
  Filled 2015-04-23 (×2): qty 1

## 2015-04-23 MED ORDER — SODIUM CHLORIDE 0.9 % IV SOLN
INTRAVENOUS | Status: DC
Start: 2015-04-23 — End: 2015-04-23
  Administered 2015-04-23: 09:00:00 via INTRAVENOUS

## 2015-04-23 MED ORDER — FUROSEMIDE 20 MG PO TABS: 20.0000 mg | ORAL_TABLET | Freq: Every day | ORAL | Status: DC | PRN

## 2015-04-23 MED ORDER — CHLORHEXIDINE GLUCONATE 4 % EX LIQD
60.0000 mL | Freq: Once | CUTANEOUS | Status: DC
  Filled 2015-04-23: qty 60

## 2015-04-23 MED ORDER — SODIUM CHLORIDE 0.9 % IR SOLN
80.0000 mg | Status: AC
  Administered 2015-04-23: 80 mg
  Filled 2015-04-23: qty 2

## 2015-04-23 MED ORDER — HEPARIN (PORCINE) IN NACL 2-0.9 UNIT/ML-% IJ SOLN
INTRAMUSCULAR | Status: DC | PRN
  Administered 2015-04-23: 500 mL

## 2015-04-23 MED ORDER — MIDAZOLAM HCL 5 MG/5ML IJ SOLN
INTRAMUSCULAR | Status: AC
  Filled 2015-04-23: qty 5

## 2015-04-23 MED ORDER — DOFETILIDE 500 MCG PO CAPS
500.0000 ug | ORAL_CAPSULE | Freq: Two times a day (BID) | ORAL | Status: DC
  Administered 2015-04-23: 500 ug via ORAL
  Filled 2015-04-23 (×4): qty 1

## 2015-04-23 MED ORDER — DEXTROSE 5 % IV SOLN
3.0000 g | INTRAVENOUS | Status: AC
  Administered 2015-04-23: 3 g via INTRAVENOUS
  Filled 2015-04-23: qty 3000

## 2015-04-23 MED ORDER — FENTANYL CITRATE (PF) 100 MCG/2ML IJ SOLN
INTRAMUSCULAR | Status: AC
  Filled 2015-04-23: qty 2

## 2015-04-23 MED ORDER — FENTANYL CITRATE (PF) 100 MCG/2ML IJ SOLN
INTRAMUSCULAR | Status: DC | PRN
  Administered 2015-04-23: 25 ug via INTRAVENOUS
  Administered 2015-04-23 (×4): 12.5 ug via INTRAVENOUS
  Administered 2015-04-23 (×2): 25 ug via INTRAVENOUS

## 2015-04-23 MED ORDER — LISINOPRIL 10 MG PO TABS
10.0000 mg | ORAL_TABLET | Freq: Every day | ORAL | Status: DC
  Administered 2015-04-23: 10 mg via ORAL
  Filled 2015-04-23 (×2): qty 1

## 2015-04-23 MED ORDER — YOU HAVE A PACEMAKER BOOK
Freq: Once | Status: AC
  Administered 2015-04-23
  Filled 2015-04-23: qty 1

## 2015-04-23 MED ORDER — MUPIROCIN 2 % EX OINT
TOPICAL_OINTMENT | CUTANEOUS | Status: AC
  Administered 2015-04-23: 1 via TOPICAL
  Filled 2015-04-23: qty 22

## 2015-04-23 MED ORDER — NITROGLYCERIN 0.4 MG SL SUBL: 0.4000 mg | SUBLINGUAL_TABLET | SUBLINGUAL | Status: DC | PRN

## 2015-04-23 MED ORDER — MIDAZOLAM HCL 5 MG/5ML IJ SOLN
INTRAMUSCULAR | Status: DC | PRN
  Administered 2015-04-23: 2 mg via INTRAVENOUS
  Administered 2015-04-23 (×3): 1 mg via INTRAVENOUS
  Administered 2015-04-23: 2 mg via INTRAVENOUS
  Administered 2015-04-23: 1 mg via INTRAVENOUS
  Administered 2015-04-23: 2 mg via INTRAVENOUS
  Administered 2015-04-23: 1 mg via INTRAVENOUS

## 2015-04-23 SURGICAL SUPPLY — 12 items
CABLE SURGICAL S-101-97-12 (CABLE) ×2 IMPLANT
ICD ELLIPSE DR CD2411-36C (ICD Generator) ×2 IMPLANT
LEAD DURATA 7122-65CM (Lead) ×2 IMPLANT
LEAD TENDRIL SDX 1688TC-52CM (Lead) ×2 IMPLANT
NEEDLE PERC 18GX7CM (NEEDLE) ×2 IMPLANT
PAD DEFIB LIFELINK (PAD) ×2 IMPLANT
SHEATH CLASSIC 7F (SHEATH) IMPLANT
SHEATH CLASSIC 8F (SHEATH) IMPLANT
SHEATH CLASSIC 9F (SHEATH) IMPLANT
SHEATH COOK PEEL AWAY SET 7F (SHEATH) ×2 IMPLANT
SHEATH COOK PEEL AWAY SET 8F (SHEATH) ×2 IMPLANT
TRAY PACEMAKER INSERTION (CUSTOM PROCEDURE TRAY) ×2 IMPLANT
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
## 2015-04-23 NOTE — Discharge Summary (Signed)
ELECTROPHYSIOLOGY PROCEDURE DISCHARGE SUMMARY    Patient ID: Brandon Torres,  MRN: [id number], DOB/AGE: 1966/10/10 49 y.o.  Admit date: 04/23/2015 Discharge date: 04/24/2015  Primary Care Physician: Fredirick Maudlin, MD Primary Cardiologist: Wyline Mood Electrophysiologist: Ladona Ridgel  Primary Discharge Diagnosis:  Non ischemic cardiomyopathy status post ICD implantation this admission  Secondary Discharge Diagnosis:  1.  Morbid obesity 2.  Paroxysmal atrial fibrillation 3.  Hypertension 4.  Hyperlipidemia 5.  Sleep apnea on CPAP  No Known Allergies   Procedures This Admission:  1.  Implantation of a STJ single chamber ICD on 04/23/15 by Dr Ladona Ridgel.  See op note for full details.  DFT's were successful at 20 J.  There were no immediate post procedure complications. 2.  CXR on 04/24/15 demonstrated no pneumothorax status post device implantation.   Brief HPI: Brandon Torres is a 49 y.o. male was referred to electrophysiology in the outpatient setting for consideration of ICD implantation.  Past medical history includes atrial fibrillation, non ischemic cardiomyopathy, and morbid obesity.  The patient has persistent LV dysfunction despite guideline directed therapy.  Risks, benefits, and alternatives to ICD implantation were reviewed with the patient who wished to proceed.   Hospital Course:  The patient was admitted and underwent implantation of a STJ single chamber ICD with details as outlined above. He was monitored on telemetry overnight which demonstrated AV pacing.  Left chest was without hematoma or ecchymosis.  The device was interrogated and found to be functioning normally.  CXR was obtained and demonstrated no pneumothorax status post device implantation.  Wound care, arm mobility, and restrictions were reviewed with the patient.  The patient was examined and considered stable for discharge to home.   The patient's discharge medications include an ACE-I (Lisinopril) and beta  blocker (Metoprolol).   BMET/Mg/QTc are stable on Tikosyn.  This patients CHA2DS2-VASc Score and unadjusted Ischemic Stroke Rate (% per year) is equal to 2.2 % stroke rate/year from a score of 2 Above score calculated as 1 point each if present [CHF, HTN, DM, Vascular=MI/PAD/Aortic Plaque, Age if 65-74, or Male] Above score calculated as 2 points each if present [Age > 75, or Stroke/TIA/TE]   Physical Exam: Filed Vitals:   04/23/15 2016 04/23/15 2055 04/24/15 0008 04/24/15 0615  BP: 95/42 101/50 100/61 94/52  Pulse: 63  62 61  Temp: 98.4 F (36.9 C)  98.2 F (36.8 C) 97.7 F (36.5 C)  TempSrc: Oral  Oral Oral  Resp: 15 14 19 17   Height:      Weight:   313 lb 0.9 oz (142 kg)   SpO2: 97%  99% 98%    GEN- The patient is well appearing, alert and oriented x 3 today.   HEENT: normocephalic, atraumatic; sclera clear, conjunctiva pink; hearing intact; oropharynx clear; neck supple, no JVP Lymph- no cervical lymphadenopathy Lungs- Clear to ausculation bilaterally, normal work of breathing.  No wheezes, rales, rhonchi Heart- Regular rate and rhythm, no murmurs, rubs or gallops  GI- soft, non-tender, non-distended, bowel sounds present  Extremities- no clubbing, cyanosis, or edema; DP/PT/radial pulses 2+ bilaterally MS- no significant deformity or atrophy Skin- warm and dry, no rash or lesion, left chest without hematoma/ecchymosis Psych- euthymic mood, full affect Neuro- strength and sensation are intact   Labs:   Lab Results  Component Value Date   WBC 6.1 04/16/2015   HGB 14.6 04/16/2015   HCT 43.9 04/16/2015   MCV 88.4 04/16/2015   PLT 172.0 04/16/2015     Recent  Labs Lab 04/24/15 0303  NA 135  K 4.2  CL 105  CO2 24  BUN 12  CREATININE 0.81  CALCIUM 8.8*  GLUCOSE 96    Discharge Medications:    Medication List    TAKE these medications        furosemide 20 MG tablet  Commonly known as:  LASIX  Take 20 mg by mouth daily as needed for edema.     GOODYS  EXTRA STRENGTH PO  Take 1 Package by mouth 2 (two) times daily as needed (headache).     lisinopril 10 MG tablet  Commonly known as:  PRINIVIL,ZESTRIL  Take 1 tablet (10 mg total) by mouth daily.     metoprolol succinate 25 MG 24 hr tablet  Commonly known as:  TOPROL XL  Take 1 tablet (25 mg total) by mouth 2 (two) times daily.     nitroGLYCERIN 0.4 MG SL tablet  Commonly known as:  NITROSTAT  Place 1 tablet (0.4 mg total) under the tongue every 5 (five) minutes as needed for chest pain.     TIKOSYN 500 MCG capsule  Generic drug:  dofetilide  TAKE 1 CAPSULE BY MOUTH EVERY 12 HOURS.     XARELTO 20 MG Tabs tablet  Generic drug:  rivaroxaban  TAKE ONE TABLET BY MOUTH EVERY DAY WITH DINNER.        Disposition:  Discharge Instructions    Diet - low sodium heart healthy    Complete by:  As directed      Increase activity slowly    Complete by:  As directed           Follow-up Information    Follow up with [facility] On 05/01/2015.   Why:  at 10AM for wound check   Contact information:   [address] [place] [address]       Follow up with Lewayne Bunting, MD On 07/24/2015.   Specialty:  Cardiology   Why:  at 10AM   Contact information:   [address] [place] [address] [phone]       Duration of Discharge Encounter: Greater than 30 minutes including physician time.  Signed, Gypsy Balsam, NP 04/24/2015 7:22 AM   EP Attending  Patient seen and examined. Agree with above. Ok for discharge. His device is working normally. Usual followup.  Leonia Reeves.D.

## 2015-04-23 NOTE — H&P (Signed)
  ICD Criteria  Current LVEF:25% ;Obtained > or = 1 month ago and < or = 3 months ago.  NYHA Functional Classification: Class II  Heart Failure History:  Yes, Duration of heart failure since onset is 3 to 9 months  Non-Ischemic Dilated Cardiomyopathy History:  Yes, timeframe is 3 to 9 months  Atrial Fibrillation/Atrial Flutter:  Yes, A-Fib/A-Flutter type: Paroxysmal.  Ventricular Tachycardia History:  No.  Cardiac Arrest History:  No  History of Syndromes with Risk of Sudden Death:  No.  Previous ICD:  No.  Electrophysiology Study: No.  Prior MI: No.  PPM: No.  OSA:  No  Patient Life Expectancy of >=1 year: Yes.  Anticoagulation Therapy:  Patient is on anticoagulation therapy, anticoagulation was held prior to procedure.   Beta Blocker Therapy:  Yes.   Ace Inhibitor/ARB Therapy:  Yes.

## 2015-04-23 NOTE — Discharge Instructions (Signed)
Supplemental Discharge Instructions for  Pacemaker/Defibrillator Patients  Activity No heavy lifting or vigorous activity with your left/right arm for 6 to 8 weeks.  Do not raise your left/right arm above your head for one week.  Gradually raise your affected arm as drawn below.           __     04/28/15                    04/29/15                     04/30/15                       05/01/15  NO DRIVING for   1 week  ; you may begin driving on  10/30/08   .  WOUND CARE - Keep the wound area clean and dry.  Do not get this area wet for one week. No showers for one week; you may shower on  05/01/15   . - The tape/steri-strips on your wound will fall off; do not pull them off.  No bandage is needed on the site.  DO  NOT apply any creams, oils, or ointments to the wound area. - If you notice any drainage or discharge from the wound, any swelling or bruising at the site, or you develop a fever > 101? F after you are discharged home, call the office at once.  Special Instructions - You are still able to use cellular telephones; use the ear opposite the side where you have your pacemaker/defibrillator.  Avoid carrying your cellular phone near your device. - When traveling through airports, show security personnel your identification card to avoid being screened in the metal detectors.  Ask the security personnel to use the hand wand. - Avoid arc welding equipment, MRI testing (magnetic resonance imaging), TENS units (transcutaneous nerve stimulators).  Call the office for questions about other devices. - Avoid electrical appliances that are in poor condition or are not properly grounded. - Microwave ovens are safe to be near or to operate.  Additional information for defibrillator patients should your device go off: - If your device goes off ONCE and you feel fine afterward, notify the device clinic nurses. - If your device goes off ONCE and you do not feel well afterward, call 911. - If your device  goes off TWICE, call 911. - If your device goes off THREE times in one day, call 911.  DO NOT DRIVE YOURSELF OR A FAMILY MEMBER WITH A DEFIBRILLATOR TO THE HOSPITAL--CALL 911.  Information on my medicine - XARELTO (Rivaroxaban)  This medication education was reviewed with me or my healthcare representative as part of my discharge preparation.    Why was Xarelto prescribed for you? Xarelto was prescribed for you to reduce the risk of a blood clot forming that can cause a stroke if you have a medical condition called atrial fibrillation (a type of irregular heartbeat).  What do you need to know about xarelto ? Take your Xarelto ONCE DAILY at the same time every day with your evening meal. If you have difficulty swallowing the tablet whole, you may crush it and mix in applesauce just prior to taking your dose.  Take Xarelto exactly as prescribed by your doctor and DO NOT stop taking Xarelto without talking to the doctor who prescribed the medication.  Stopping without other stroke prevention medication to take the place of Xarelto  may increase your risk of developing a clot that causes a stroke.  Refill your prescription before you run out.  After discharge, you should have regular check-up appointments with your healthcare provider that is prescribing your Xarelto.  In the future your dose may need to be changed if your kidney function or weight changes by a significant amount.  What do you do if you miss a dose? If you are taking Xarelto ONCE DAILY and you miss a dose, take it as soon as you remember on the same day then continue your regularly scheduled once daily regimen the next day. Do not take two doses of Xarelto at the same time or on the same day.   Important Safety Information A possible side effect of Xarelto is bleeding. You should call your healthcare provider right away if you experience any of the following: ? Bleeding from an injury or your nose that does not  stop. ? Unusual colored urine (red or dark brown) or unusual colored stools (red or black). ? Unusual bruising for unknown reasons. ? A serious fall or if you hit your head (even if there is no bleeding).  Some medicines may interact with Xarelto and might increase your risk of bleeding while on Xarelto. To help avoid this, consult your healthcare provider or pharmacist prior to using any new prescription or non-prescription medications, including herbals, vitamins, non-steroidal anti-inflammatory drugs (NSAIDs) and supplements.  This website has more information on Xarelto: VisitDestination.com.br.

## 2015-04-23 NOTE — Progress Notes (Signed)
Patient declines the use of nocturnal CPAP. He admits to having a machine at home, but states he does not tolerate the mask. Education provided to both he and the wife on the amara view full face mask, and recommended he follow up with his CPAP provider.

## 2015-04-23 NOTE — Interval H&P Note (Signed)
History and Physical Interval Note:  04/23/2015 10:14 AM  [name]  has presented today for surgery, with the diagnosis of afib/hf  The various methods of treatment have been discussed with the patient and family. After consideration of risks, benefits and other options for treatment, the patient has consented to  Procedure(s): ICD Implant (N/A) as a surgical intervention .  The patient's history has been reviewed, patient examined, no change in status, stable for surgery.  I have reviewed the patient's chart and labs.  Questions were answered to the patient's satisfaction.     Brandon Torres

## 2015-04-24 ENCOUNTER — Ambulatory Visit (HOSPITAL_COMMUNITY): Payer: 59

## 2015-04-24 DIAGNOSIS — Z95810 Presence of automatic (implantable) cardiac defibrillator: Secondary | ICD-10-CM | POA: Diagnosis not present

## 2015-04-24 DIAGNOSIS — I429 Cardiomyopathy, unspecified: Secondary | ICD-10-CM | POA: Diagnosis not present

## 2015-04-24 DIAGNOSIS — I10 Essential (primary) hypertension: Secondary | ICD-10-CM | POA: Diagnosis not present

## 2015-04-24 DIAGNOSIS — I480 Paroxysmal atrial fibrillation: Secondary | ICD-10-CM | POA: Diagnosis not present

## 2015-04-24 LAB — BASIC METABOLIC PANEL
Anion gap: 6 (ref 5–15)
BUN: 12 mg/dL (ref 6–20)
CO2: 24 mmol/L (ref 22–32)
Calcium: 8.8 mg/dL — ABNORMAL LOW (ref 8.9–10.3)
Chloride: 105 mmol/L (ref 101–111)
Creatinine, Ser: 0.81 mg/dL (ref 0.61–1.24)
GFR calc Af Amer: >60 mL/min (ref >60)
GFR calc non Af Amer: >60 mL/min (ref >60)
Glucose, Bld: 96 mg/dL (ref 65–99)
Potassium: 4.2 mmol/L (ref 3.5–5.1)
Sodium: 135 mmol/L (ref 135–145)

## 2015-04-24 LAB — MAGNESIUM: Magnesium: 1.8 mg/dL (ref 1.7–2.4)

## 2015-04-24 NOTE — Progress Notes (Signed)
Last night ice pack applied to left chest due to some swelling pressure dressing already in place. No blood present at site. Sling on left arm and patient compliant with restrictions of movement.

## 2015-05-01 ENCOUNTER — Telehealth: Payer: Self-pay | Admitting: Cardiology

## 2015-05-01 ENCOUNTER — Ambulatory Visit (INDEPENDENT_AMBULATORY_CARE_PROVIDER_SITE_OTHER): Payer: 59 | Admitting: *Deleted

## 2015-05-01 ENCOUNTER — Encounter: Payer: Self-pay | Admitting: Internal Medicine

## 2015-05-01 DIAGNOSIS — I420 Dilated cardiomyopathy: Secondary | ICD-10-CM

## 2015-05-01 DIAGNOSIS — I480 Paroxysmal atrial fibrillation: Secondary | ICD-10-CM | POA: Diagnosis not present

## 2015-05-01 DIAGNOSIS — Z95810 Presence of automatic (implantable) cardiac defibrillator: Secondary | ICD-10-CM

## 2015-05-01 NOTE — Progress Notes (Signed)
Wound check appointment. Steri-strips removed. Wound without redness or edema. Incision edges approximated, wound well healed. Normal device function. Thresholds, sensing, and impedances consistent with implant measurements. Device programmed at 3.5V/ autocapture for extra safety margin until 3 month visit. Histogram distribution appropriate for patient and level of activity. 69 mode switches (71% AT/AF +xarelto)- longest 14 hrs, pk A 256, pk V 119. No ventricular arrhythmias noted. Patient educated about wound care, arm mobility, lifting restrictions, shock plan. ROV wit hGT 07-25-2015 at  8:20am.

## 2015-05-01 NOTE — Telephone Encounter (Signed)
Pt confirms no symptoms. Pt believes it was a shock b/c he was previously conscious for a DCCV---states what happened today felt same as DCCV. States he was using riding mower. 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 Pt was walking towards chicken coop then felt "sledge hammer to my chest." He attempted to send manual transmission. Pt stayed on phone w/ Britta Mccreedy for . I stayed on phone for 20+min. Merlin was still attempting to transmit. No failure of transmission but no success either.   Pt spoke very clearly throughout conversation. He continued to be asymptomatic during conversation.   We ceased current attempt to transmit. I instructed pt to disconnect cell adapter then connect his Merlin through analog cord. We re-initiated transmission. Transmission continued to process. I reviewed shock plan w/ pt. Pt aware from our conversation and instructions at today's wound check appt what to do if he gets shocked again.   Due to pt's full coherency, no symptoms, and difficulty w/ his Merlin, we agreed to stop phone call. Pt aware I will call him again tomorrow.

## 2015-05-01 NOTE — Telephone Encounter (Signed)
Pt called and stated that he believes his defibrillator shocked him. Pt stated that he feels fine. No chest pain, shortness of breath, fatigue, or dizziness. instructed pt to send manual transmission w/ his home monitor. Pt verbalized understanding. Forwarded call to device tech.

## 2015-05-02 ENCOUNTER — Ambulatory Visit (INDEPENDENT_AMBULATORY_CARE_PROVIDER_SITE_OTHER): Payer: 59 | Admitting: Cardiology

## 2015-05-02 ENCOUNTER — Encounter: Payer: Self-pay | Admitting: Internal Medicine

## 2015-05-02 ENCOUNTER — Ambulatory Visit (INDEPENDENT_AMBULATORY_CARE_PROVIDER_SITE_OTHER): Payer: 59 | Admitting: *Deleted

## 2015-05-02 ENCOUNTER — Encounter: Payer: Self-pay | Admitting: Cardiology

## 2015-05-02 VITALS — BP 113/72 | HR 60 | Ht 72.0 in | Wt 314.0 lb

## 2015-05-02 DIAGNOSIS — I5040 Unspecified combined systolic (congestive) and diastolic (congestive) heart failure: Secondary | ICD-10-CM

## 2015-05-02 DIAGNOSIS — I5022 Chronic systolic (congestive) heart failure: Secondary | ICD-10-CM

## 2015-05-02 DIAGNOSIS — I480 Paroxysmal atrial fibrillation: Secondary | ICD-10-CM | POA: Diagnosis not present

## 2015-05-02 DIAGNOSIS — I420 Dilated cardiomyopathy: Secondary | ICD-10-CM

## 2015-05-02 DIAGNOSIS — T82198A Other mechanical complication of other cardiac electronic device, initial encounter: Secondary | ICD-10-CM

## 2015-05-02 LAB — CUP PACEART INCLINIC DEVICE CHECK
Battery Remaining Longevity: 70.8 mo
Brady Statistic RA Percent Paced: 38 %
Brady Statistic RV Percent Paced: 27 %
Date Time Interrogation Session: 20160708141756
Lead Channel Pacing Threshold Amplitude: 0.625 V
Lead Channel Pacing Threshold Pulse Width: 0.5 ms
Lead Channel Sensing Intrinsic Amplitude: 0.6 mV
Lead Channel Sensing Intrinsic Amplitude: 8.6 mV
Lead Channel Setting Pacing Amplitude: 0.875
Lead Channel Setting Pacing Amplitude: 3.5 V
Lead Channel Setting Pacing Pulse Width: 0.5 ms
Lead Channel Setting Sensing Sensitivity: 0.5 mV
Pulse Gen Serial Number: 7136879
Zone Setting Detection Interval: 250 ms
Zone Setting Detection Interval: 300 ms

## 2015-05-02 MED ORDER — METOPROLOL SUCCINATE ER 25 MG PO TB24: 37.5000 mg | ORAL_TABLET | Freq: Two times a day (BID) | ORAL | Status: DC

## 2015-05-02 NOTE — Progress Notes (Signed)
ICD check in clinic for shock received yesterday evening. Normal device function. 1 VF episode- 24 secs, 200bpm, ATP and 1 30J shock converted to sinus. EGM shows 1:1 AT/ Aflutter. 17 AMS episodes. AMS rate decreased to 160 from 180bpm. Reprogrammed to 2 zones- VT zone programmed at 200bpm with ATP x 3, 25J, 30J, 36J-- sudden onset and morphology 80% match discriminators. VF zone increased to 240bpm from 200bpm. Programming changes per SK. Device programmed at appropriate safety margins. Device programmed to optimize intrinsic conduction. Estimated longevity 5.5-5.9 years. Pt enrolled in remote follow-up. ROV with GT in [place] 07/25/15.

## 2015-05-02 NOTE — Telephone Encounter (Signed)
Pt able to send manual remote this morning. Remote shows pt was shocked for 1:1 SVT. Shock delivered due to rate at 200bpm. Pt only has 1 zone for VF---SVT discriminators not able to apply. Pt has frequent Afib/flutter but EGMs show RVR rates always below 199bpm. Pt coming into device clinic today to reprogram.
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
## 2015-05-02 NOTE — Patient Instructions (Signed)
   Increase Toprol XL to 37.5mg  (1 1/2 tabs) twice a day  - new sent to pharmacy today. Continue all other medications.   Keep already scheduled follow up for 06/02/2015.

## 2015-05-05 ENCOUNTER — Encounter: Payer: Self-pay | Admitting: Cardiology

## 2015-05-12 ENCOUNTER — Telehealth: Payer: Self-pay | Admitting: *Deleted

## 2015-05-12 ENCOUNTER — Encounter: Payer: Self-pay | Admitting: *Deleted

## 2015-05-12 NOTE — Telephone Encounter (Signed)
Letter sent as requested with updates

## 2015-05-12 NOTE — Telephone Encounter (Signed)
-----   Message from Antoine Poche, MD sent at 05/12/2015 10:00 AM EDT ----- Regarding: RE: Letter for work Yes  Dominga Ferry MD ----- Message -----    From: Albertine Patricia, CMA    Sent: 05/12/2015   8:51 AM      To: Antoine Poche, MD Subject: Letter for work                                Pt would like to have another letter sent to extend time out of work until 07/25/15. Is this ok for me to send?  Staci

## 2015-06-02 ENCOUNTER — Ambulatory Visit (INDEPENDENT_AMBULATORY_CARE_PROVIDER_SITE_OTHER): Payer: 59 | Admitting: Cardiology

## 2015-06-02 ENCOUNTER — Encounter: Payer: Self-pay | Admitting: Cardiology

## 2015-06-02 VITALS — BP 97/64 | HR 58 | Ht 72.0 in | Wt 313.0 lb

## 2015-06-02 DIAGNOSIS — I4891 Unspecified atrial fibrillation: Secondary | ICD-10-CM

## 2015-06-02 DIAGNOSIS — I5022 Chronic systolic (congestive) heart failure: Secondary | ICD-10-CM

## 2015-06-02 MED ORDER — METOPROLOL SUCCINATE ER 50 MG PO TB24: 50.0000 mg | ORAL_TABLET | Freq: Two times a day (BID) | ORAL | Status: DC

## 2015-06-02 NOTE — Patient Instructions (Signed)
Your physician has recommended you make the following change in your medication:  Increase your toprol xl to 50 mg twice daily. You may take (2) of your 25 mg tablets twice daily until they are finished. Continue all other medications the same. Your physician recommends that you schedule a follow-up appointment in: 1 month.
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
## 2015-06-17 ENCOUNTER — Other Ambulatory Visit: Payer: Self-pay | Admitting: Cardiology

## 2015-06-17 ENCOUNTER — Other Ambulatory Visit: Payer: Self-pay | Admitting: Internal Medicine

## 2015-07-07 ENCOUNTER — Ambulatory Visit (INDEPENDENT_AMBULATORY_CARE_PROVIDER_SITE_OTHER): Payer: 59 | Admitting: Cardiology

## 2015-07-07 ENCOUNTER — Encounter: Payer: Self-pay | Admitting: Cardiology

## 2015-07-07 VITALS — BP 101/69 | HR 61 | Ht 72.0 in | Wt 317.1 lb

## 2015-07-07 DIAGNOSIS — I5022 Chronic systolic (congestive) heart failure: Secondary | ICD-10-CM

## 2015-07-07 MED ORDER — METOPROLOL SUCCINATE ER 50 MG PO TB24: ORAL_TABLET | ORAL | Status: DC
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
## 2015-07-07 NOTE — Patient Instructions (Signed)
Your physician recommends that you schedule a follow-up appointment in: 1 MONTH WITH DR. BRANCH  Your physician has recommended you make the following change in your medication:   INCREASE TOPROL XL 50 MG IN THE MORNING AND 75 MG IN THE EVENING FOR 2 WEEKS, THEN INCREASE TO 75 MG TWICE DAILY.  Thank you for choosing [facility]!!

## 2015-07-19 ENCOUNTER — Other Ambulatory Visit: Payer: Self-pay | Admitting: Internal Medicine

## 2015-07-24 ENCOUNTER — Encounter: Payer: 59 | Admitting: Internal Medicine

## 2015-07-25 ENCOUNTER — Ambulatory Visit (INDEPENDENT_AMBULATORY_CARE_PROVIDER_SITE_OTHER): Payer: 59 | Admitting: Internal Medicine

## 2015-07-25 ENCOUNTER — Encounter: Payer: Self-pay | Admitting: Internal Medicine

## 2015-07-25 VITALS — BP 118/66 | HR 99 | Ht 72.0 in | Wt 310.8 lb

## 2015-07-25 DIAGNOSIS — E669 Obesity, unspecified: Secondary | ICD-10-CM

## 2015-07-25 DIAGNOSIS — Z95810 Presence of automatic (implantable) cardiac defibrillator: Secondary | ICD-10-CM | POA: Diagnosis not present

## 2015-07-25 DIAGNOSIS — I5040 Unspecified combined systolic (congestive) and diastolic (congestive) heart failure: Secondary | ICD-10-CM | POA: Diagnosis not present

## 2015-07-25 DIAGNOSIS — I429 Cardiomyopathy, unspecified: Secondary | ICD-10-CM

## 2015-07-25 DIAGNOSIS — E668 Other obesity: Secondary | ICD-10-CM

## 2015-07-25 DIAGNOSIS — I4891 Unspecified atrial fibrillation: Secondary | ICD-10-CM

## 2015-07-25 DIAGNOSIS — I428 Other cardiomyopathies: Secondary | ICD-10-CM

## 2015-07-25 DIAGNOSIS — I420 Dilated cardiomyopathy: Secondary | ICD-10-CM

## 2015-07-25 LAB — CUP PACEART INCLINIC DEVICE CHECK
Battery Remaining Longevity: 72 mo
Brady Statistic RA Percent Paced: 54 %
Brady Statistic RV Percent Paced: 31 %
Date Time Interrogation Session: 20160930112243
HighPow Impedance: 63 Ohm
Lead Channel Impedance Value: 437.5 Ohm
Lead Channel Impedance Value: 450 Ohm
Lead Channel Pacing Threshold Amplitude: 0.625 V
Lead Channel Pacing Threshold Amplitude: 1 V
Lead Channel Pacing Threshold Amplitude: 1 V
Lead Channel Pacing Threshold Pulse Width: 0.5 ms
Lead Channel Pacing Threshold Pulse Width: 0.5 ms
Lead Channel Pacing Threshold Pulse Width: 0.5 ms
Lead Channel Sensing Intrinsic Amplitude: 0.5 mV
Lead Channel Sensing Intrinsic Amplitude: 7.6 mV
Lead Channel Setting Pacing Amplitude: 2 V
Lead Channel Setting Pacing Amplitude: 2.5 V
Lead Channel Setting Pacing Pulse Width: 0.5 ms
Lead Channel Setting Sensing Sensitivity: 0.5 mV
Pulse Gen Serial Number: 7136879
Zone Setting Detection Interval: 250 ms
Zone Setting Detection Interval: 300 ms

## 2015-07-25 NOTE — Assessment & Plan Note (Signed)
His [employer] DDD ICD is working normally. Will recheck in several months. 

## 2015-07-25 NOTE — Assessment & Plan Note (Signed)
In the past 3 months, he has been in rhythm 70% of the time. He will continue Tikosyn. If his atrial fib worsens, I would encouraged him to be referred to see Dr. Campbell Lerner.

## 2015-07-25 NOTE — Assessment & Plan Note (Signed)
His symptoms are class 2. He will continue his current meds and I have asked that he try and lose weight and reduce his sodium intake.

## 2015-07-25 NOTE — Patient Instructions (Signed)
Your physician wants you to follow-up in: 9 Months with Dr. Ladona Ridgel. You will receive a reminder letter in the mail two months in advance. If you don't receive a letter, please call our office to schedule the follow-up appointment.  Remote monitoring is used to monitor your Pacemaker of ICD from home. This monitoring reduces the number of office visits required to check your device to one time per year. It allows [name] to keep an eye on the functioning of your device to ensure it is working properly. You are scheduled for a device check from home on 10/28/15. You may send your transmission at any time that day. If you have a wireless device, the transmission will be sent automatically. After your physician reviews your transmission, you will receive a postcard with your next transmission date.  Your physician recommends that you continue on your current medications as directed. Please refer to the Current Medication list given to you today.  Thank you for choosing [facility]!
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
## 2015-07-25 NOTE — Assessment & Plan Note (Signed)
Weight loss has been discussed and recommended.

## 2015-08-04 ENCOUNTER — Encounter: Payer: Self-pay | Admitting: Internal Medicine

## 2015-08-20 ENCOUNTER — Ambulatory Visit (INDEPENDENT_AMBULATORY_CARE_PROVIDER_SITE_OTHER): Payer: 59 | Admitting: Cardiology

## 2015-08-20 ENCOUNTER — Encounter: Payer: Self-pay | Admitting: Cardiology

## 2015-08-20 VITALS — BP 107/69 | HR 82 | Ht 72.0 in | Wt 314.0 lb

## 2015-08-20 DIAGNOSIS — I5022 Chronic systolic (congestive) heart failure: Secondary | ICD-10-CM | POA: Diagnosis not present

## 2015-08-20 DIAGNOSIS — I4891 Unspecified atrial fibrillation: Secondary | ICD-10-CM

## 2015-08-20 DIAGNOSIS — Z7901 Long term (current) use of anticoagulants: Secondary | ICD-10-CM | POA: Diagnosis not present

## 2015-08-20 DIAGNOSIS — Z23 Encounter for immunization: Secondary | ICD-10-CM

## 2015-08-20 MED ORDER — METOPROLOL SUCCINATE ER 100 MG PO TB24: 100.0000 mg | ORAL_TABLET | Freq: Two times a day (BID) | ORAL | Status: DC
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
## 2015-08-20 NOTE — Patient Instructions (Signed)
Your physician recommends that you schedule a follow-up appointment in: 6 WEEKS WITH DR. BRANCH  Your physician has recommended you make the following change in your medication:   INCREASE TOPROL XL 100 MG TWICE DAILY  Thank you for choosing [facility]!!

## 2015-10-01 ENCOUNTER — Telehealth: Payer: Self-pay | Admitting: *Deleted

## 2015-10-01 ENCOUNTER — Encounter: Payer: Self-pay | Admitting: Cardiology

## 2015-10-01 ENCOUNTER — Ambulatory Visit (INDEPENDENT_AMBULATORY_CARE_PROVIDER_SITE_OTHER): Payer: 59 | Admitting: Cardiology

## 2015-10-01 VITALS — BP 99/67 | HR 63 | Ht 72.0 in | Wt 316.0 lb

## 2015-10-01 DIAGNOSIS — I420 Dilated cardiomyopathy: Secondary | ICD-10-CM

## 2015-10-01 DIAGNOSIS — I5040 Unspecified combined systolic (congestive) and diastolic (congestive) heart failure: Secondary | ICD-10-CM

## 2015-10-01 DIAGNOSIS — R7309 Other abnormal glucose: Secondary | ICD-10-CM | POA: Diagnosis not present

## 2015-10-01 DIAGNOSIS — E785 Hyperlipidemia, unspecified: Secondary | ICD-10-CM

## 2015-10-01 MED ORDER — LISINOPRIL 20 MG PO TABS: 20.0000 mg | ORAL_TABLET | Freq: Every day | ORAL | Status: DC
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
## 2015-10-01 NOTE — Telephone Encounter (Signed)
Pt was here for OV with Dr. Wyline Mood today and says he is still having palpitations off and on. Pt has remote check 10/28/15 but per Dr. Wyline Mood will forward to device clinic for possible earlier check due to symptoms

## 2015-10-01 NOTE — Telephone Encounter (Signed)
Informed pt that he can send remote transmission now. He stated that he will send later today.

## 2015-10-01 NOTE — Telephone Encounter (Signed)
LMOVM for pt to return call 

## 2015-10-01 NOTE — Telephone Encounter (Signed)
Spoke w/ pt and he said his that he was trying to send the transmission but all 5 orange lights was blinking. Instructed pt to call tech services to receive help trouble shooting monitor.

## 2015-10-01 NOTE — Patient Instructions (Signed)
Your physician recommends that you schedule a follow-up appointment in: 1 MONTH WITH DR. BRANCH  Your physician has recommended you make the following change in your medication:   INCREASE LISINOPRIL 20 MG DAILY  Your physician recommends that you return for lab work in: 2 WEEKS CMP/CBC/TSH/MG/LIPIDS/HGBA1C - PLEASE FAST PRIOR TO LAB WORK   Thank you for choosing [facility]!!

## 2015-10-02 ENCOUNTER — Encounter: Payer: Self-pay | Admitting: Internal Medicine

## 2015-10-02 ENCOUNTER — Telehealth: Payer: Self-pay | Admitting: Internal Medicine

## 2015-10-02 NOTE — Telephone Encounter (Signed)
Spoke to Dr.Taylor about patient's AFL. Dr.Taylor suggested that pt f/u w/ Rudi Coco, NP to discuss a possible DCCV. Patient notified and voiced understanding. Message sent to [facility]ie [facility] for scheduling.

## 2015-10-02 NOTE — Telephone Encounter (Signed)
error 

## 2015-10-02 NOTE — Telephone Encounter (Signed)
LMTCB/SSS 

## 2015-10-06 ENCOUNTER — Encounter (HOSPITAL_COMMUNITY): Payer: Self-pay | Admitting: Nurse Practitioner

## 2015-10-06 ENCOUNTER — Ambulatory Visit (HOSPITAL_COMMUNITY)
Admission: RE | Admit: 2015-10-06 | Discharge: 2015-10-06 | Disposition: A | Discharge status: Home / Self Care | Payer: 59 | Source: Ambulatory Visit | Attending: Nurse Practitioner | Admitting: Nurse Practitioner

## 2015-10-06 VITALS — BP 106/72 | HR 61 | Ht 72.0 in | Wt 315.4 lb

## 2015-10-06 DIAGNOSIS — Z6841 Body Mass Index (BMI) 40.0 and over, adult: Secondary | ICD-10-CM | POA: Insufficient documentation

## 2015-10-06 DIAGNOSIS — Z8042 Family history of malignant neoplasm of prostate: Secondary | ICD-10-CM | POA: Insufficient documentation

## 2015-10-06 DIAGNOSIS — Z95810 Presence of automatic (implantable) cardiac defibrillator: Secondary | ICD-10-CM | POA: Insufficient documentation

## 2015-10-06 DIAGNOSIS — I4892 Unspecified atrial flutter: Secondary | ICD-10-CM | POA: Insufficient documentation

## 2015-10-06 DIAGNOSIS — G4733 Obstructive sleep apnea (adult) (pediatric): Secondary | ICD-10-CM | POA: Diagnosis not present

## 2015-10-06 DIAGNOSIS — Z7902 Long term (current) use of antithrombotics/antiplatelets: Secondary | ICD-10-CM | POA: Insufficient documentation

## 2015-10-06 DIAGNOSIS — I509 Heart failure, unspecified: Secondary | ICD-10-CM | POA: Insufficient documentation

## 2015-10-06 DIAGNOSIS — I428 Other cardiomyopathies: Secondary | ICD-10-CM | POA: Diagnosis not present

## 2015-10-06 DIAGNOSIS — Z79899 Other long term (current) drug therapy: Secondary | ICD-10-CM | POA: Insufficient documentation

## 2015-10-06 LAB — COMPREHENSIVE METABOLIC PANEL
ALT: 24 U/L (ref 17–63)
AST: 19 U/L (ref 15–41)
Albumin: 4.2 g/dL (ref 3.5–5.0)
Alkaline Phosphatase: 73 U/L (ref 38–126)
Anion gap: 6 (ref 5–15)
BUN: 10 mg/dL (ref 6–20)
CO2: 28 mmol/L (ref 22–32)
Calcium: 9.7 mg/dL (ref 8.9–10.3)
Chloride: 103 mmol/L (ref 101–111)
Creatinine, Ser: 0.88 mg/dL (ref 0.61–1.24)
GFR calc Af Amer: >60 mL/min (ref >60)
GFR calc non Af Amer: >60 mL/min (ref >60)
Glucose, Bld: 109 mg/dL — ABNORMAL HIGH (ref 65–99)
Potassium: 5.1 mmol/L (ref 3.5–5.1)
Sodium: 137 mmol/L (ref 135–145)
Total Bilirubin: 0.9 mg/dL (ref 0.3–1.2)
Total Protein: 7.2 g/dL (ref 6.5–8.1)

## 2015-10-06 LAB — LIPID PANEL
Cholesterol: 193 mg/dL (ref 0–200)
HDL: 48 mg/dL (ref >40)
LDL Cholesterol: 129 mg/dL — ABNORMAL HIGH (ref 0–99)
Total CHOL/HDL Ratio: 4 RATIO
Triglycerides: 81 mg/dL (ref <150)
VLDL: 16 mg/dL (ref 0–40)

## 2015-10-06 LAB — CBC
HCT: 47.2 % (ref 39.0–52.0)
Hemoglobin: 15.6 g/dL (ref 13.0–17.0)
MCH: 29.7 pg (ref 26.0–34.0)
MCHC: 33.1 g/dL (ref 30.0–36.0)
MCV: 89.7 fL (ref 78.0–100.0)
Platelets: 182 10*3/uL (ref 150–400)
RBC: 5.26 MIL/uL (ref 4.22–5.81)
RDW: 12.3 % (ref 11.5–15.5)
WBC: 6.5 10*3/uL (ref 4.0–10.5)

## 2015-10-06 LAB — TSH: TSH: 2.357 u[IU]/mL (ref 0.350–4.500)

## 2015-10-06 LAB — MAGNESIUM: Magnesium: 1.9 mg/dL (ref 1.7–2.4)

## 2015-10-06 MED ORDER — AMIODARONE HCL 200 MG PO TABS: 200.0000 mg | ORAL_TABLET | Freq: Two times a day (BID) | ORAL | Status: DC
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
## 2015-10-06 NOTE — Patient Instructions (Addendum)
Your physician has recommended you make the following change in your medication:  1)Stop tikosyn 2) On Friday Morning - Start Amiodarone 200mg  twice daily 3)Take lasix 20mg  once a day for 3 days then back to as needed -- weigh yourself daily - if notice weight gain of 3-5 pounds would take lasix   Follow up with Dr. Ladona Ridgel in 6 weeks -- scheduler will be in touch to schedule this appointment

## 2015-10-07 ENCOUNTER — Encounter (HOSPITAL_COMMUNITY): Payer: Self-pay

## 2015-10-07 ENCOUNTER — Ambulatory Visit (HOSPITAL_COMMUNITY): Admit: 2015-10-07 | Payer: Self-pay | Admitting: Cardiology

## 2015-10-07 LAB — HEMOGLOBIN A1C
Hgb A1c MFr Bld: 5.4 % (ref 4.8–5.6)
Mean Plasma Glucose: 108 mg/dL

## 2015-10-07 SURGERY — CARDIOVERSION
Anesthesia: Monitor Anesthesia Care

## 2015-10-28 ENCOUNTER — Ambulatory Visit: Payer: 59 | Admitting: *Deleted

## 2015-10-28 ENCOUNTER — Telehealth: Payer: Self-pay | Admitting: Cardiology

## 2015-10-28 NOTE — Telephone Encounter (Signed)
LMOVM reminding pt to send remote transmission.   

## 2015-10-29 NOTE — Progress Notes (Signed)
Remote ICD transmission.   

## 2015-11-03 ENCOUNTER — Ambulatory Visit (INDEPENDENT_AMBULATORY_CARE_PROVIDER_SITE_OTHER): Payer: 59 | Admitting: Cardiology

## 2015-11-03 ENCOUNTER — Encounter: Payer: Self-pay | Admitting: Cardiology

## 2015-11-03 VITALS — BP 92/64 | HR 60 | Ht 72.0 in | Wt 318.4 lb

## 2015-11-03 DIAGNOSIS — I480 Paroxysmal atrial fibrillation: Secondary | ICD-10-CM | POA: Diagnosis not present

## 2015-11-03 DIAGNOSIS — Z7901 Long term (current) use of anticoagulants: Secondary | ICD-10-CM

## 2015-11-03 DIAGNOSIS — I5022 Chronic systolic (congestive) heart failure: Secondary | ICD-10-CM

## 2015-11-03 MED ORDER — SACUBITRIL-VALSARTAN 24-26 MG PO TABS: 1.0000 | ORAL_TABLET | Freq: Two times a day (BID) | ORAL | Status: DC

## 2015-11-03 NOTE — Patient Instructions (Signed)
Your physician recommends that you schedule a follow-up appointment in: 3 MONTHS WITH DR. BRANCH  Your physician has recommended you make the following change in your medication:   STOP LISINOPRIL   ON Wednesday 11/05/15 START ENTRESTO 24MG /26/MG TWICE DAILY  Thank you for choosing [facility]!!
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
## 2015-11-21 ENCOUNTER — Telehealth: Payer: Self-pay | Admitting: Internal Medicine

## 2015-11-21 ENCOUNTER — Encounter: Payer: Self-pay | Admitting: Internal Medicine

## 2015-11-21 ENCOUNTER — Ambulatory Visit (INDEPENDENT_AMBULATORY_CARE_PROVIDER_SITE_OTHER): Payer: 59 | Admitting: Internal Medicine

## 2015-11-21 VITALS — BP 98/60 | HR 63 | Ht 72.0 in | Wt 314.0 lb

## 2015-11-21 DIAGNOSIS — I4819 Other persistent atrial fibrillation: Secondary | ICD-10-CM

## 2015-11-21 DIAGNOSIS — I481 Persistent atrial fibrillation: Secondary | ICD-10-CM | POA: Diagnosis not present

## 2015-11-21 DIAGNOSIS — I5022 Chronic systolic (congestive) heart failure: Secondary | ICD-10-CM

## 2015-11-21 LAB — CUP PACEART INCLINIC DEVICE CHECK
Battery Remaining Longevity: 66 mo
Battery Remaining Percentage: 79 %
Brady Statistic RA Percent Paced: 2.4 %
Brady Statistic RV Percent Paced: 69 %
Date Time Interrogation Session: 20170127151733
HighPow Impedance: 70 Ohm
Implantable Lead Implant Date: 20160629
Implantable Lead Implant Date: 20160629
Implantable Lead Location: 753859
Implantable Lead Location: 753860
Implantable Lead Model: 7122
Lead Channel Impedance Value: 440 Ohm
Lead Channel Impedance Value: 450 Ohm
Lead Channel Pacing Threshold Amplitude: 0.75 V
Lead Channel Pacing Threshold Amplitude: 0.75 V
Lead Channel Pacing Threshold Pulse Width: 0.5 ms
Lead Channel Pacing Threshold Pulse Width: 0.5 ms
Lead Channel Sensing Intrinsic Amplitude: 8.7 mV
Lead Channel Setting Pacing Amplitude: 2 V
Lead Channel Setting Pacing Amplitude: 2.5 V
Lead Channel Setting Pacing Pulse Width: 0.5 ms
Lead Channel Setting Sensing Sensitivity: 0.5 mV
Pulse Gen Serial Number: 7136879

## 2015-11-21 MED ORDER — AMIODARONE HCL 200 MG PO TABS: 200.0000 mg | ORAL_TABLET | Freq: Two times a day (BID) | ORAL | Status: DC

## 2015-11-21 MED ORDER — AMIODARONE HCL 200 MG PO TABS: 300.0000 mg | ORAL_TABLET | Freq: Every day | ORAL | Status: DC
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
## 2015-11-21 NOTE — Telephone Encounter (Signed)
NEw Message  Pt dtr calling to speak w/ RN about when cardioversion is scheduled- Please call back and discuss.

## 2015-11-21 NOTE — Telephone Encounter (Signed)
Patient notified that cardioversion is scheduled for [facility]. Feb. 2 @ 1:00 pm. With Dr. Royann Shivers

## 2015-11-21 NOTE — Patient Instructions (Addendum)
Your physician wants you to follow-up in: 12 Months with Dr. Ladona Ridgel. You will receive a reminder letter in the mail two months in advance. If you don't receive a letter, please call our office to schedule the follow-up appointment.  Remote monitoring is used to monitor your Pacemaker of ICD from home. This monitoring reduces the number of office visits required to check your device to one time per year. It allows [name] to keep an eye on the functioning of your device to ensure it is working properly. You are scheduled for a device check from home on 02/23/16. You may send your transmission at any time that day. If you have a wireless device, the transmission will be sent automatically. After your physician reviews your transmission, you will receive a postcard with your next transmission date.  Your physician has recommended you make the following change in your medication:  Increase Amiodarone to 300 mg (1 1/2 Tablets) Daily   Your physician has recommended that you have a Cardioversion (DCCV). Electrical Cardioversion uses a jolt of electricity to your heart either through paddles or wired patches attached to your chest. This is a controlled, usually prescheduled, procedure. Defibrillation is done under light anesthesia in the hospital, and you usually go home the day of the procedure. This is done to get your heart back into a normal rhythm. You are not awake for the procedure. Please see the instruction sheet given to you today.  If you need a refill on your cardiac medications before your next appointment, please call your pharmacy.  Thank you for choosing [facility]!

## 2015-11-25 ENCOUNTER — Telehealth: Payer: Self-pay | Admitting: Internal Medicine

## 2015-11-25 NOTE — Telephone Encounter (Signed)
New Message    11/27/15 pt has a procedure scheduled and he needs to speak to rn on what measures he should take  Dr.C

## 2015-11-26 ENCOUNTER — Encounter: Payer: Self-pay | Admitting: *Deleted

## 2015-11-26 NOTE — Telephone Encounter (Signed)
Patient notified of instructions.

## 2015-11-27 ENCOUNTER — Ambulatory Visit (HOSPITAL_COMMUNITY): Payer: 59 | Admitting: Anesthesiology

## 2015-11-27 ENCOUNTER — Encounter (HOSPITAL_COMMUNITY): Payer: Self-pay | Admitting: *Deleted

## 2015-11-27 ENCOUNTER — Encounter (HOSPITAL_COMMUNITY)
Admission: RE | Disposition: A | Discharge status: Home / Self Care | Payer: Self-pay | Source: Ambulatory Visit | Attending: Cardiovascular Disease

## 2015-11-27 ENCOUNTER — Ambulatory Visit (HOSPITAL_COMMUNITY)
Admission: RE | Admit: 2015-11-27 | Discharge: 2015-11-27 | Disposition: A | Discharge status: Home / Self Care | Payer: 59 | Source: Ambulatory Visit | Attending: Cardiovascular Disease | Admitting: Cardiovascular Disease

## 2015-11-27 DIAGNOSIS — Z95810 Presence of automatic (implantable) cardiac defibrillator: Secondary | ICD-10-CM | POA: Diagnosis not present

## 2015-11-27 DIAGNOSIS — I428 Other cardiomyopathies: Secondary | ICD-10-CM | POA: Diagnosis not present

## 2015-11-27 DIAGNOSIS — Z6841 Body Mass Index (BMI) 40.0 and over, adult: Secondary | ICD-10-CM | POA: Diagnosis not present

## 2015-11-27 DIAGNOSIS — Z79899 Other long term (current) drug therapy: Secondary | ICD-10-CM | POA: Diagnosis not present

## 2015-11-27 DIAGNOSIS — I4891 Unspecified atrial fibrillation: Secondary | ICD-10-CM | POA: Diagnosis present

## 2015-11-27 DIAGNOSIS — Z7901 Long term (current) use of anticoagulants: Secondary | ICD-10-CM | POA: Diagnosis not present

## 2015-11-27 DIAGNOSIS — I5022 Chronic systolic (congestive) heart failure: Secondary | ICD-10-CM | POA: Insufficient documentation

## 2015-11-27 DIAGNOSIS — I481 Persistent atrial fibrillation: Secondary | ICD-10-CM | POA: Diagnosis not present

## 2015-11-27 DIAGNOSIS — I480 Paroxysmal atrial fibrillation: Secondary | ICD-10-CM | POA: Insufficient documentation

## 2015-11-27 DIAGNOSIS — I110 Hypertensive heart disease with heart failure: Secondary | ICD-10-CM | POA: Insufficient documentation

## 2015-11-27 DIAGNOSIS — I4819 Other persistent atrial fibrillation: Secondary | ICD-10-CM | POA: Insufficient documentation

## 2015-11-27 DIAGNOSIS — G4733 Obstructive sleep apnea (adult) (pediatric): Secondary | ICD-10-CM | POA: Insufficient documentation

## 2015-11-27 HISTORY — PX: CARDIOVERSION: SHX1299

## 2015-11-27 LAB — POCT I-STAT 4, (NA,K, GLUC, HGB,HCT)
Glucose, Bld: 91 mg/dL (ref 65–99)
HCT: 45 % (ref 39.0–52.0)
Hemoglobin: 15.3 g/dL (ref 13.0–17.0)
Potassium: 4.2 mmol/L (ref 3.5–5.1)
Sodium: 142 mmol/L (ref 135–145)

## 2015-11-27 SURGERY — CARDIOVERSION
Anesthesia: General

## 2015-11-27 MED ORDER — LIDOCAINE HCL (CARDIAC) 20 MG/ML IV SOLN
INTRAVENOUS | Status: DC | PRN
  Administered 2015-11-27: 75 mg via INTRAVENOUS

## 2015-11-27 MED ORDER — PROPOFOL 10 MG/ML IV BOLUS
INTRAVENOUS | Status: DC | PRN
  Administered 2015-11-27: 150 mg via INTRAVENOUS

## 2015-11-27 MED ORDER — SODIUM CHLORIDE 0.9 % IV SOLN
INTRAVENOUS | Status: DC | PRN
  Administered 2015-11-27: 13:00:00 via INTRAVENOUS

## 2015-11-27 MED ORDER — SODIUM CHLORIDE 0.9 % IV SOLN: INTRAVENOUS | Status: DC

## 2015-11-27 NOTE — Discharge Instructions (Signed)
Electrical Cardioversion, Care After °Refer to this sheet in the next few weeks. These instructions provide you with information on caring for yourself after your procedure. Your health care provider may also give you more specific instructions. Your treatment has been planned according to current medical practices, but problems sometimes occur. Call your health care provider if you have any problems or questions after your procedure. °WHAT TO EXPECT AFTER THE PROCEDURE °After your procedure, it is typical to have the following sensations: °· Some redness on the skin where the shocks were delivered. If this is tender, a sunburn lotion or hydrocortisone cream may help. °· Possible return of an abnormal heart rhythm within hours or days after the procedure. °HOME CARE INSTRUCTIONS °· Take medicines only as directed by your health care provider. Be sure you understand how and when to take your medicine. °· Learn how to feel your pulse and check it often. °· Limit your activity for 48 hours after the procedure or as directed by your health care provider. °· Avoid or minimize caffeine and other stimulants as directed by your health care provider. °SEEK MEDICAL CARE IF: °· You feel like your heart is beating too fast or your pulse is not regular. °· You have any questions about your medicines. °· You have bleeding that will not stop. °SEEK IMMEDIATE MEDICAL CARE IF: °· You are dizzy or feel faint. °· It is hard to breathe or you feel short of breath. °· There is a change in discomfort in your chest. °· Your speech is slurred or you have trouble moving an arm or leg on one side of your body. °· You get a serious muscle cramp that does not go away. °· Your fingers or toes turn cold or blue. °  °This information is not intended to replace advice given to you by your health care provider. Make sure you discuss any questions you have with your health care provider. °  °Document Released: 08/01/2013 Document Revised: 11/01/2014  Document Reviewed: 08/01/2013 °Elsevier Interactive Patient Education ©2016 [employer] Inc[employer] ° °

## 2015-11-27 NOTE — Transfer of Care (Signed)
Immediate Anesthesia Transfer of Care Note  Patient: Brandon Torres  Procedure(s) Performed: Procedure(s): CARDIOVERSION (N/A)  Patient Location: Endoscopy Unit  Anesthesia Type:MAC  Level of Consciousness: awake, alert , oriented and patient cooperative  Airway & Oxygen Therapy: Patient Spontanous Breathing and Patient connected to nasal cannula oxygen  Post-op Assessment: Report given to RN, Post -op Vital signs reviewed and stable and Patient moving all extremities  Post vital signs: Reviewed and stable  Last Vitals:  Filed Vitals:   11/27/15 1125 11/27/15 1317  BP: 125/79 124/75  Pulse: 60 62  Temp: 36.9 C   Resp:  12    Complications: No apparent anesthesia complications
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
## 2015-11-27 NOTE — Anesthesia Postprocedure Evaluation (Signed)
Anesthesia Post Note  Patient: Brandon Torres  Procedure(s) Performed: Procedure(s) (LRB): CARDIOVERSION (N/A)  Patient location during evaluation: Endoscopy Anesthesia Type: MAC Level of consciousness: awake, awake and alert, oriented and patient cooperative Pain management: pain level controlled Vital Signs Assessment: post-procedure vital signs reviewed and stable Respiratory status: spontaneous breathing Cardiovascular status: blood pressure returned to baseline and stable Postop Assessment: no headache and no signs of nausea or vomiting Anesthetic complications: no    Last Vitals:  Filed Vitals:   11/27/15 1125 11/27/15 1317  BP: 125/79 124/75  Pulse: 60 62  Temp: 36.9 C   Resp:  12    Last Pain: There were no vitals filed for this visit.               Taniaya Rudder

## 2015-11-27 NOTE — Op Note (Signed)
Procedure: Electrical Cardioversion Indications:  Atrial Fibrillation  Procedure Details:  Consent: Risks of procedure as well as the alternatives and risks of each were explained to the (patient/caregiver).  Consent for procedure obtained.  Time Out: Verified patient identification, verified procedure, site/side was marked, verified correct patient position, special equipment/implants available, medications/allergies/relevent history reviewed, required imaging and test results available.  Performed  Patient placed on cardiac monitor, pulse oximetry, supplemental oxygen as necessary.  Sedation given: propofol 120 mg IV, Dr. Jacklynn Bue Pacer pads placed anterior and posterior chest.  Cardioverted 1 time(s).  Cardioversion with synchronized biphasic 150J shock.  Evaluation: Findings: Post procedure EKG shows: A paced V sensed rhythm Complications: None. ICD check after DCCV showed normal device function. Patient did tolerate procedure well.  Time Spent Directly with the Patient:  30 minutes   Brandon Torres 11/27/2015, 1:23 PM

## 2015-11-27 NOTE — Anesthesia Preprocedure Evaluation (Addendum)
Anesthesia Evaluation  Patient identified by MRN, date of birth, ID band Patient awake    Reviewed: Allergy & Precautions, NPO status , Patient's Chart, lab work & pertinent test results, reviewed documented beta blocker date and time   Airway Mallampati: II  TM Distance: >3 FB Neck ROM: Full    Dental  (+) Dental Advisory Given, Teeth Intact   Pulmonary sleep apnea and Continuous Positive Airway Pressure Ventilation ,    breath sounds clear to auscultation       Cardiovascular hypertension, Pt. on medications and Pt. on home beta blockers +CHF  + dysrhythmias Atrial Fibrillation + Cardiac Defibrillator  Rhythm:Irregular Rate:Normal  Echo - - Left ventricle: The cavity size was severely dilated. Wall thickness was normal. Systolic function was severely reduced. The estimated ejection fraction was in the range of 20% to 25%. Diffusely hypokinetic. Features are consistent with a pseudonormal left ventricular filling pattern, with concomitant abnormal relaxation and increased filling pressure (grade 2 diastolic dysfunction). Doppler parameters are consistent with high ventricular filling pressure. - Mitral valve: Mildly calcified annulus. There was mild regurgitation. - Left atrium: The atrium was severely dilated. - Right ventricle: The cavity size was mildly dilated. Systolic function was mildly reduced. TAPSE appears overestimated, as there appears to be reduced systolic function. - Right atrium: The atrium was mildly to moderately dilated. - Tricuspid valve: There was mild regurgitation.   Neuro/Psych    GI/Hepatic   Endo/Other  Morbid obesity  Renal/GU      Musculoskeletal   Abdominal   Peds  Hematology   Anesthesia Other Findings   Reproductive/Obstetrics                          Anesthesia Physical Anesthesia Plan  ASA: III  Anesthesia Plan: General   Post-op Pain  Management:    Induction: Intravenous  Airway Management Planned: Mask  Additional Equipment:   Intra-op Plan:   Post-operative Plan:   Informed Consent: I have reviewed the patients History and Physical, chart, labs and discussed the procedure including the risks, benefits and alternatives for the proposed anesthesia with the patient or authorized representative who has indicated his/her understanding and acceptance.   Dental advisory given  Plan Discussed with: Anesthesiologist and Surgeon  Anesthesia Plan Comments:         Anesthesia Quick Evaluation

## 2015-11-27 NOTE — Interval H&P Note (Signed)
History and Physical Interval Note:  11/27/2015 12:53 PM  Brandon Torres  has presented today for surgery, with the diagnosis of Afib  The various methods of treatment have been discussed with the patient and family. After consideration of risks, benefits and other options for treatment, the patient has consented to  Procedure(s): CARDIOVERSION (N/A) as a surgical intervention .  The patient's history has been reviewed, patient examined, no change in status, stable for surgery.  I have reviewed the patient's chart and labs.  Questions were answered to the patient's satisfaction.     Brandon Torres

## 2015-11-28 ENCOUNTER — Encounter (HOSPITAL_COMMUNITY): Payer: Self-pay | Admitting: Cardiovascular Disease

## 2015-12-05 ENCOUNTER — Ambulatory Visit (INDEPENDENT_AMBULATORY_CARE_PROVIDER_SITE_OTHER): Payer: 59 | Admitting: *Deleted

## 2015-12-05 DIAGNOSIS — I420 Dilated cardiomyopathy: Secondary | ICD-10-CM

## 2015-12-05 NOTE — Progress Notes (Signed)
Remote ICD transmission.   

## 2016-01-07 LAB — CUP PACEART REMOTE DEVICE CHECK
Battery Remaining Longevity: 65 mo
Battery Remaining Percentage: 79 %
Battery Voltage: 2.95 V
Brady Statistic AP VP Percent: 75 %
Brady Statistic AP VS Percent: 23 %
Brady Statistic AS VP Percent: 1.6 %
Brady Statistic AS VS Percent: 1 %
Brady Statistic RA Percent Paced: 61 %
Brady Statistic RV Percent Paced: 80 %
Date Time Interrogation Session: 20170210123419
HighPow Impedance: 73 Ohm
HighPow Impedance: 73 Ohm
Implantable Lead Implant Date: 20160629
Implantable Lead Implant Date: 20160629
Implantable Lead Location: 753859
Implantable Lead Location: 753860
Implantable Lead Model: 7122
Lead Channel Impedance Value: 460 Ohm
Lead Channel Impedance Value: 490 Ohm
Lead Channel Pacing Threshold Amplitude: 0.5 V
Lead Channel Pacing Threshold Amplitude: 0.75 V
Lead Channel Pacing Threshold Pulse Width: 0.5 ms
Lead Channel Pacing Threshold Pulse Width: 0.5 ms
Lead Channel Sensing Intrinsic Amplitude: 0.2 mV
Lead Channel Sensing Intrinsic Amplitude: 9.5 mV
Lead Channel Setting Pacing Amplitude: 2 V
Lead Channel Setting Pacing Amplitude: 2.5 V
Lead Channel Setting Pacing Pulse Width: 0.5 ms
Lead Channel Setting Sensing Sensitivity: 0.5 mV
Pulse Gen Serial Number: 7136879

## 2016-01-09 ENCOUNTER — Encounter: Payer: Self-pay | Admitting: Cardiology

## 2016-01-26 ENCOUNTER — Encounter: Payer: Self-pay | Admitting: Cardiology

## 2016-01-26 ENCOUNTER — Ambulatory Visit (INDEPENDENT_AMBULATORY_CARE_PROVIDER_SITE_OTHER): Payer: 59 | Admitting: Cardiology

## 2016-01-26 VITALS — BP 103/73 | HR 60 | Ht 72.0 in | Wt 320.2 lb

## 2016-01-26 DIAGNOSIS — I5022 Chronic systolic (congestive) heart failure: Secondary | ICD-10-CM | POA: Diagnosis not present

## 2016-01-26 DIAGNOSIS — I4891 Unspecified atrial fibrillation: Secondary | ICD-10-CM

## 2016-01-26 DIAGNOSIS — G4733 Obstructive sleep apnea (adult) (pediatric): Secondary | ICD-10-CM | POA: Diagnosis not present

## 2016-01-26 NOTE — Patient Instructions (Signed)
Your physician recommends that you schedule a follow-up appointment in: 3 months with Dr. Wyline Mood  Your physician has recommended you make the following change in your medication:   STOP NITROGLYCERIN    Thank you for choosing [facility]!!
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
## 2016-02-13 ENCOUNTER — Encounter: Payer: Self-pay | Admitting: Internal Medicine

## 2016-02-16 ENCOUNTER — Telehealth: Payer: Self-pay | Admitting: Cardiology

## 2016-02-16 NOTE — Telephone Encounter (Signed)
Pt wife called and stated that pt believes that he is back in atrial fib. Pt wife wants to know if he is and if they need to do anything (make an appt). Pt had a cardioversion on 11-27-15. Please call her back on her cell phone.

## 2016-02-16 NOTE — Telephone Encounter (Signed)
Spoke with Mrs. Herbel- she states that he's feeling well, they just wanted to make Dr. Ladona Ridgel aware that the patient is back in AF if he has further recommendations. Pt still taking xarelto and amio.

## 2016-02-18 ENCOUNTER — Encounter: Payer: Self-pay | Admitting: Internal Medicine

## 2016-02-18 ENCOUNTER — Telehealth: Payer: Self-pay | Admitting: Internal Medicine

## 2016-02-18 NOTE — Telephone Encounter (Signed)
Pt returned call-pls call [phone]

## 2016-02-18 NOTE — Telephone Encounter (Signed)
Remote received. I informed patient that he is back in AF. I told him that Dr.Taylor will not be in the office for the rest of this week, but I would make him aware next week. I told patient that I could see if Dr.Branch could see him sooner, but patient said he was willing to wait.   Will inform Dr.Taylor about patient c/o and call patient with any further recommendations. Patient voiced understanding.

## 2016-02-18 NOTE — Telephone Encounter (Signed)
LMTCB//sss 

## 2016-02-18 NOTE — Telephone Encounter (Signed)
New message  The Pacer has him feeling jittery and uncomfortable. Tightness in left arm. Pt wife request a call back to discuss

## 2016-02-18 NOTE — Telephone Encounter (Signed)
Spoke to patient about sx's. Patient states that he's been feeling "jittery" and believes that he's back in AF.   Patient asked to send a manual remote for review. Plan to call patient once it has been received.

## 2016-02-23 ENCOUNTER — Other Ambulatory Visit: Payer: Self-pay | Admitting: Internal Medicine

## 2016-02-26 MED ORDER — AMIODARONE HCL 200 MG PO TABS: 200.0000 mg | ORAL_TABLET | ORAL | Status: DC

## 2016-02-26 NOTE — Addendum Note (Signed)
Addended by: Sebastian Ache on: 02/26/2016 10:06 AM   Modules accepted: Orders

## 2016-02-26 NOTE — Telephone Encounter (Addendum)
Dr.Taylor recommends that patient increase his Amiodarone to 200mg  TID x 2 weeks, then 200mg  BID and f/u with him in 4 weeks ([place] if possible).  Patient informed about Dr.Taylor's recommendations.   Will forward to Glynda Jaeger for scheduling appt.  Rx sent to pharmacy per pt request.

## 2016-03-06 ENCOUNTER — Other Ambulatory Visit: Payer: Self-pay | Admitting: Cardiology

## 2016-03-08 ENCOUNTER — Ambulatory Visit (INDEPENDENT_AMBULATORY_CARE_PROVIDER_SITE_OTHER): Payer: 59 | Admitting: *Deleted

## 2016-03-08 DIAGNOSIS — I429 Cardiomyopathy, unspecified: Secondary | ICD-10-CM

## 2016-03-08 DIAGNOSIS — I428 Other cardiomyopathies: Secondary | ICD-10-CM | POA: Diagnosis not present

## 2016-03-08 DIAGNOSIS — I5022 Chronic systolic (congestive) heart failure: Secondary | ICD-10-CM | POA: Diagnosis not present

## 2016-03-08 NOTE — Progress Notes (Signed)
Remote ICD transmission.   

## 2016-03-15 ENCOUNTER — Other Ambulatory Visit: Payer: Self-pay | Admitting: *Deleted

## 2016-03-15 DIAGNOSIS — I5040 Unspecified combined systolic (congestive) and diastolic (congestive) heart failure: Secondary | ICD-10-CM

## 2016-03-15 MED ORDER — FUROSEMIDE 20 MG PO TABS: 20.0000 mg | ORAL_TABLET | Freq: Every day | ORAL | Status: DC | PRN

## 2016-03-25 ENCOUNTER — Other Ambulatory Visit: Payer: Self-pay | Admitting: Cardiology

## 2016-03-26 ENCOUNTER — Encounter: Payer: Self-pay | Admitting: Internal Medicine

## 2016-03-26 ENCOUNTER — Ambulatory Visit (INDEPENDENT_AMBULATORY_CARE_PROVIDER_SITE_OTHER): Payer: 59 | Admitting: Internal Medicine

## 2016-03-26 VITALS — BP 108/82 | HR 76 | Ht 72.0 in | Wt 322.0 lb

## 2016-03-26 DIAGNOSIS — I5022 Chronic systolic (congestive) heart failure: Secondary | ICD-10-CM
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
## 2016-03-26 NOTE — Patient Instructions (Signed)
Medication Instructions:  Your physician has recommended you make the following change in your medication:  1) Stop Amiodarone    Labwork: None ordered   Testing/Procedures: None ordered   Follow-Up: Your physician wants you to follow-up in: 3 months with Dr Ladona Ridgel  Any Other Special Instructions Will Be Listed Below (If Applicable).     If you need a refill on your cardiac medications before your next appointment, please call your pharmacy.

## 2016-03-30 LAB — CUP PACEART REMOTE DEVICE CHECK
Battery Remaining Longevity: 65 mo
Battery Remaining Percentage: 76 %
Battery Voltage: 2.95 V
Brady Statistic AP VP Percent: 65 %
Brady Statistic AP VS Percent: 20 %
Brady Statistic AS VP Percent: 15 %
Brady Statistic AS VS Percent: 1 %
Brady Statistic RA Percent Paced: 37 %
Brady Statistic RV Percent Paced: 85 %
Date Time Interrogation Session: 20170515060017
HighPow Impedance: 66 Ohm
HighPow Impedance: 66 Ohm
Implantable Lead Implant Date: 20160629
Implantable Lead Implant Date: 20160629
Implantable Lead Location: 753859
Implantable Lead Location: 753860
Implantable Lead Model: 7122
Lead Channel Impedance Value: 440 Ohm
Lead Channel Impedance Value: 450 Ohm
Lead Channel Sensing Intrinsic Amplitude: 0.2 mV
Lead Channel Sensing Intrinsic Amplitude: 6.4 mV
Lead Channel Setting Pacing Amplitude: 2 V
Lead Channel Setting Pacing Amplitude: 2.5 V
Lead Channel Setting Pacing Pulse Width: 0.5 ms
Lead Channel Setting Sensing Sensitivity: 0.5 mV
Pulse Gen Serial Number: 7136879

## 2016-04-07 ENCOUNTER — Encounter: Payer: Self-pay | Admitting: Cardiology

## 2016-04-21 ENCOUNTER — Encounter: Payer: Self-pay | Admitting: Cardiology

## 2016-05-03 ENCOUNTER — Encounter: Payer: Self-pay | Admitting: *Deleted

## 2016-05-03 ENCOUNTER — Encounter: Payer: Self-pay | Admitting: Cardiology

## 2016-05-03 ENCOUNTER — Ambulatory Visit (INDEPENDENT_AMBULATORY_CARE_PROVIDER_SITE_OTHER): Payer: 59 | Admitting: Cardiology

## 2016-05-03 VITALS — BP 118/90 | HR 84 | Ht 72.0 in | Wt 323.0 lb

## 2016-05-03 DIAGNOSIS — I4891 Unspecified atrial fibrillation: Secondary | ICD-10-CM | POA: Diagnosis not present

## 2016-05-03 DIAGNOSIS — G4733 Obstructive sleep apnea (adult) (pediatric): Secondary | ICD-10-CM

## 2016-05-03 DIAGNOSIS — I5022 Chronic systolic (congestive) heart failure: Secondary | ICD-10-CM | POA: Diagnosis not present

## 2016-05-03 MED ORDER — FUROSEMIDE 40 MG PO TABS: ORAL_TABLET | ORAL | Status: DC

## 2016-05-03 MED ORDER — SACUBITRIL-VALSARTAN 49-51 MG PO TABS: 1.0000 | ORAL_TABLET | Freq: Two times a day (BID) | ORAL | Status: DC
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
## 2016-05-03 NOTE — Patient Instructions (Signed)
Your physician recommends that you schedule a follow-up appointment in: 2 MONTHS WITH DR. BRANCH  Your physician has recommended you make the following change in your medication:   INCREASE LASIX 40 MG DAILY AS NEEDED  INCREASE ENTRESTO 49/51 MG TWICE DAILY   Your physician recommends that you return for lab work in: 2 WEEKS BMP/MG  You have been referred to CARDIAC REHAB  Thank you for choosing [facility]!!

## 2016-05-21 ENCOUNTER — Encounter (HOSPITAL_COMMUNITY): Payer: Self-pay

## 2016-06-02 ENCOUNTER — Encounter: Payer: Self-pay | Admitting: *Deleted

## 2016-06-17 ENCOUNTER — Encounter (HOSPITAL_COMMUNITY)
Admission: RE | Admit: 2016-06-17 | Discharge: 2016-06-17 | Disposition: A | Discharge status: Home / Self Care | Payer: 59 | Source: Ambulatory Visit | Attending: Cardiology | Admitting: Cardiology

## 2016-06-17 VITALS — BP 110/64 | HR 61 | Ht 72.0 in | Wt 323.0 lb

## 2016-06-17 DIAGNOSIS — Z95810 Presence of automatic (implantable) cardiac defibrillator: Secondary | ICD-10-CM | POA: Diagnosis not present

## 2016-06-17 DIAGNOSIS — G4733 Obstructive sleep apnea (adult) (pediatric): Secondary | ICD-10-CM | POA: Diagnosis not present

## 2016-06-17 DIAGNOSIS — I480 Paroxysmal atrial fibrillation: Secondary | ICD-10-CM | POA: Insufficient documentation

## 2016-06-17 DIAGNOSIS — Z7901 Long term (current) use of anticoagulants: Secondary | ICD-10-CM | POA: Diagnosis not present

## 2016-06-17 DIAGNOSIS — I5042 Chronic combined systolic (congestive) and diastolic (congestive) heart failure: Secondary | ICD-10-CM | POA: Diagnosis present

## 2016-06-17 DIAGNOSIS — I5040 Unspecified combined systolic (congestive) and diastolic (congestive) heart failure: Secondary | ICD-10-CM

## 2016-06-17 NOTE — Progress Notes (Signed)
Cardiac/Pulmonary Rehab Medication Review by a Pharmacist  Does the patient  feel that his/her medications are working for him/her?  yes  Has the patient been experiencing any side effects to the medications prescribed?  yes  Does the patient measure his/her own blood pressure or blood glucose at home?  yes (Blood Pressure)  Does the patient have any problems obtaining medications due to transportation or finances?   no  Understanding of regimen: excellent Understanding of indications: excellent Potential of compliance: good  Pharmacist comments: Good compliance, use phone to remind patient when to take medications.  No further questions at this time.  Mady Gemma 06/17/2016 2:55 PM

## 2016-06-17 NOTE — Progress Notes (Signed)
Cardiac Individual Treatment Plan  Patient Details  Name: Brandon Torres MRN: [id number] Date of Birth: 1966/09/10 Referring Provider:   Flowsheet Row CARDIAC REHAB PHASE II ORIENTATION from 06/17/2016 in [facility] CARDIAC REHABILITATION  Referring Provider  Dr. Wyline Mood       Initial Encounter Date:  Flowsheet Row CARDIAC REHAB PHASE II ORIENTATION from 06/17/2016 in [place] CARDIAC REHABILITATION  Date  06/17/16  Referring Provider  Dr. Wyline Mood       Visit Diagnosis: Combined systolic and diastolic congestive heart failure, NYHA class 2 (HCC)  Patient's Home Medications on Admission:  Current Outpatient Prescriptions:  .  furosemide (LASIX) 40 MG tablet, Take 1 tab daily as needed for swelling, Disp: 90 tablet, Rfl: 3 .  metoprolol succinate (TOPROL XL) 100 MG 24 hr tablet, Take 1 tablet (100 mg total) by mouth 2 (two) times daily. Take with or immediately following a meal., Disp: 180 tablet, Rfl: 3 .  sacubitril-valsartan (ENTRESTO) 49-51 MG, Take 1 tablet by mouth 2 (two) times daily., Disp: 180 tablet, Rfl: 3 .  XARELTO 20 MG TABS tablet, TAKE ONE TABLET BY MOUTH EVERY DAY WITH DINNER., Disp: 30 tablet, Rfl: 6 .  acetaminophen (TYLENOL) 500 MG tablet, Take 500 mg by mouth every 6 (six) hours as needed for mild pain or headache. , Disp: , Rfl:   Past Medical History: Past Medical History:  Diagnosis Date  . AICD (automatic cardioverter/defibrillator) present   . CHF (congestive heart failure) (HCC)   . Chronic anticoagulation   . Dysrhythmia   . Morbid obesity (HCC)   . Non-ischemic cardiomyopathy (HCC)   . OSA on CPAP    "have mask; can't tolerate mask" (04/23/2015)  . PAF (paroxysmal atrial fibrillation) (HCC)    RFA at [facility]+ dofetilide    Tobacco Use: History  Smoking Status  . Never Smoker  Smokeless Tobacco  . Never Used    Labs: Recent Review Flowsheet Data    Labs for ITP Cardiac and Pulmonary Rehab Latest Ref Rng & Units 03/05/2008 01/04/2012 11/12/2014  11/12/2014 10/06/2015   Cholestrol 0 - 200 mg/dL 409 811 - - 914   LDLCALC 0 - 99 mg/dL 81 782(N) - - 562(Z)   HDL >40 mg/dL 50 46 - - 48   Trlycerides <150 mg/dL 94 66 - - 81   Hemoglobin A1c 4.8 - 5.6 % - - - - 5.4   PHART 7.350 - 7.450 - - - 7.361 -   PCO2ART 35.0 - 45.0 mmHg - - - 40.6 -   HCO3 20.0 - 24.0 mEq/L - - 23.6 23.0 -   TCO2 0 - 100 mmol/L - - 25 24 -   ACIDBASEDEF 0.0 - 2.0 mmol/L - - 2.0 2.0 -   O2SAT % - - 69.0 98.0 -      Capillary Blood Glucose: No results found for: GLUCAP   Exercise Target Goals: Date: 06/17/16  Exercise Program Goal: Individual exercise prescription set with THRR, safety & activity barriers. Participant demonstrates ability to understand and report RPE using BORG scale, to self-measure pulse accurately, and to acknowledge the importance of the exercise prescription.  Exercise Prescription Goal: Starting with aerobic activity 30 plus minutes a day, 3 days per week for initial exercise prescription. Provide home exercise prescription and guidelines that participant acknowledges understanding prior to discharge.  Activity Barriers & Risk Stratification:     Activity Barriers & Cardiac Risk Stratification - 06/17/16 1601      Activity Barriers & Cardiac Risk Stratification  Activity Barriers None   Cardiac Risk Stratification High      6 Minute Walk:     6 Minute Walk    Row Name 06/17/16 1543         6 Minute Walk   Phase Initial     Distance 1500 feet     Walk Time 6 minutes     # of Rest Breaks 0     MPH 2.84     METS 3.17     RPE 12     Perceived Dyspnea  13     VO2 Peak 13.11     Symptoms No     Resting HR 61 bpm     Resting BP 110/64     Max Ex. HR 137 bpm     Max Ex. BP 126/76     2 Minute Post BP 108/72        Initial Exercise Prescription:     Initial Exercise Prescription - 06/17/16 1500      Date of Initial Exercise RX and Referring Provider   Date 06/17/16   Referring Provider Dr. Wyline Mood       Treadmill   MPH 1.3   Grade 0   Minutes 15   METs 1.9     NuStep   Level 2   Watts 15   Minutes 20   METs 1.9     Prescription Details   Frequency (times per week) 3   Duration Progress to 30 minutes of continuous aerobic without signs/symptoms of physical distress     Intensity   THRR REST +  30   THRR 40-80% of Max Heartrate 105-128-150   Ratings of Perceived Exertion 11-13   Perceived Dyspnea 0-4     Progression   Progression Continue progressive overload as per policy without signs/symptoms or physical distress.     Resistance Training   Training Prescription Yes   Weight 1   Reps 10-12      Perform Capillary Blood Glucose checks as needed.  Exercise Prescription Changes:   Exercise Comments:    Discharge Exercise Prescription (Final Exercise Prescription Changes):   Nutrition:  Target Goals: Understanding of nutrition guidelines, daily intake of sodium 1500mg , cholesterol 200mg , calories 30% from fat and 7% or less from saturated fats, daily to have 5 or more servings of fruits and vegetables.  Biometrics:     Pre Biometrics - 06/17/16 1542      Pre Biometrics   Height 6' (1.829 m)   Waist Circumference 50.5 inches   Hip Circumference 51 inches   Waist to Hip Ratio 0.99 %   BMI (Calculated) 43.9   Triceps Skinfold 18 mm   % Body Fat 38 %   Grip Strength 129.7 kg   Flexibility 0 in   Single Leg Stand 37 seconds       Nutrition Therapy Plan and Nutrition Goals:   Nutrition Discharge: Rate Your Plate Scores:     Nutrition Assessments - 06/17/16 1607      MEDFICTS Scores   Pre Score 48      Nutrition Goals Re-Evaluation:   Psychosocial: Target Goals: Acknowledge presence or absence of depression, maximize coping skills, provide positive support system. Participant is able to verbalize types and ability to use techniques and skills needed for reducing stress and depression.  Initial Review & Psychosocial Screening:     Initial  Psych Review & Screening - 06/17/16 1615      Initial Review   Current issues  with --  Patient does not have any psychosocial issues or concerns.      Family Dynamics   Good Support System? Yes     Barriers   Psychosocial barriers to participate in program There are no identifiable barriers or psychosocial needs.     Screening Interventions   Interventions Encouraged to exercise      Quality of Life Scores:     Quality of Life - 06/17/16 1546      Quality of Life Scores   Health/Function Pre 17.83 %   Socioeconomic Pre 20.93 %   Psych/Spiritual Pre 21.14 %   Family Pre 23.75 %   GLOBAL Pre 19.91 %      PHQ-9: Recent Review Flowsheet Data    [facility] 06/[facility]/2016   Decreased Interest 0   Down, Depressed, Hopeless 0   PHQ - 2 Score 0   Altered sleeping 0   Tired, decreased energy 1   Change in appetite 0   Feeling bad or failure about yourself  0   Trouble concentrating 0   Moving slowly or fidgety/restless 0   Suicidal thoughts 0   PHQ-9 Score 1   Difficult doing work/chores Not difficult at all      Psychosocial Evaluation and Intervention:     Psychosocial Evaluation - 06/17/16 1617      Psychosocial Evaluation & Interventions   Interventions Encouraged to exercise with the program and follow exercise prescription   Continued Psychosocial Services Needed No      Psychosocial Re-Evaluation:   Vocational Rehabilitation: Provide vocational rehab assistance to qualifying candidates.   Vocational Rehab Evaluation & Intervention:     Vocational Rehab - 06/17/16 1607      Initial Vocational Rehab Evaluation & Intervention   Assessment shows need for Vocational Rehabilitation No      Education: Education Goals: Education classes will be provided on a weekly basis, covering required topics. Participant will state understanding/return demonstration of topics presented.  Learning Barriers/Preferences:     Learning  Barriers/Preferences - 06/17/16 1606      Learning Barriers/Preferences   Learning Barriers None   Learning Preferences Skilled Demonstration      Education Topics: Hypertension, Hypertension Reduction -Define heart disease and high blood pressure. Discus how high blood pressure affects the body and ways to reduce high blood pressure.   Exercise and Your Heart -Discuss why it is important to exercise, the FITT principles of exercise, normal and abnormal responses to exercise, and how to exercise safely.   Angina -Discuss definition of angina, causes of angina, treatment of angina, and how to decrease risk of having angina.   Cardiac Medications -Review what the following cardiac medications are used for, how they affect the body, and side effects that may occur when taking the medications.  Medications include Aspirin, Beta blockers, calcium channel blockers, ACE Inhibitors, angiotensin receptor blockers, diuretics, digoxin, and antihyperlipidemics.   Congestive Heart Failure -Discuss the definition of CHF, how to live with CHF, the signs and symptoms of CHF, and how keep track of weight and sodium intake.   Heart Disease and Intimacy -Discus the effect sexual activity has on the heart, how changes occur during intimacy as we age, and safety during sexual activity.   Smoking Cessation / COPD -Discuss different methods to quit smoking, the health benefits of quitting smoking, and the definition of COPD.   Nutrition I: Fats -Discuss the types of cholesterol, what cholesterol does to the heart, and how cholesterol levels can  be controlled.   Nutrition II: Labels -Discuss the different components of food labels and how to read food label   Heart Parts and Heart Disease -Discuss the anatomy of the heart, the pathway of blood circulation through the heart, and these are affected by heart disease.   Stress I: Signs and Symptoms -Discuss the causes of stress, how stress may  lead to anxiety and depression, and ways to limit stress.   Stress II: Relaxation -Discuss different types of relaxation techniques to limit stress.   Warning Signs of Stroke / TIA -Discuss definition of a stroke, what the signs and symptoms are of a stroke, and how to identify when someone is having stroke.   Knowledge Questionnaire Score:     Knowledge Questionnaire Score - 06/17/16 1606      Knowledge Questionnaire Score   Pre Score 22/24      Core Components/Risk Factors/Patient Goals at Admission:     Personal Goals and Risk Factors at Admission - 06/17/16 1608      Core Components/Risk Factors/Patient Goals on Admission    Weight Management Yes   Intervention Weight Management: Develop a combined nutrition and exercise program designed to reach desired caloric intake, while maintaining appropriate intake of nutrient and fiber, sodium and fats, and appropriate energy expenditure required for the weight goal.;Weight Management/Obesity: Establish reasonable short term and long term weight goals.   Admit Weight 323 lb (146.5 kg)   Goal Weight: Short Term 318 lb (144.2 kg)   Goal Weight: Long Term 303 lb (137.4 kg)   Expected Outcomes Short Term: Continue to assess and modify interventions until short term weight is achieved;Long Term: Adherence to nutrition and physical activity/exercise program aimed toward attainment of established weight goal   Sedentary Yes   Intervention Provide advice, education, support and counseling about physical activity/exercise needs.;Develop an individualized exercise prescription for aerobic and resistive training based on initial evaluation findings, risk stratification, comorbidities and participant's personal goals.   Expected Outcomes Achievement of increased cardiorespiratory fitness and enhanced flexibility, muscular endurance and strength shown through measurements of functional capacity and personal statement of participant.   Increase  Strength and Stamina Yes   Intervention Provide advice, education, support and counseling about physical activity/exercise needs.;Develop an individualized exercise prescription for aerobic and resistive training based on initial evaluation findings, risk stratification, comorbidities and participant's personal goals.   Expected Outcomes Achievement of increased cardiorespiratory fitness and enhanced flexibility, muscular endurance and strength shown through measurements of functional capacity and personal statement of participant.   Heart Failure Yes   Intervention Provide a combined exercise and nutrition program that is supplemented with education, support and counseling about heart failure. Directed toward relieving symptoms such as shortness of breath, decreased exercise tolerance, and extremity edema.   Expected Outcomes Improve functional capacity of life;Short term: Daily weights obtained and reported for increase. Utilizing diuretic protocols set by physician.;Long term: Adoption of self-care skills and reduction of barriers for early signs and symptoms recognition and intervention leading to self-care maintenance.   Personal Goal Other Yes   Personal Goal Get heart built up again and lose 20 lbs.    Intervention Patient will exercise 3 days/week in program and supplement with 2 days/week of excerise at home.    Expected Outcomes Patient will reach the above stated goals.       Core Components/Risk Factors/Patient Goals Review:      Goals and Risk Factor Review    Row Name 06/17/16 [phone]  Core Components/Risk Factors/Patient Goals Review   Personal Goals Review Weight Management/Obesity;Increase Strength and Stamina;Heart Failure          Core Components/Risk Factors/Patient Goals at Discharge (Final Review):      Goals and Risk Factor Review - 06/17/16 1615      Core Components/Risk Factors/Patient Goals Review   Personal Goals Review Weight  Management/Obesity;Increase Strength and Stamina;Heart Failure      ITP Comments:   Comments: Patient arrived for 1st visit/orientation/education at 1415. Patient was referred to CR by Dr. Wyline Mood due to combined systolic and diastolic CHF (I50.40). During orientation advised patient on arrival and appointment times what to wear, what to do before, during and after exercise. Reviewed attendance and class policy. Talked about inclement weather and class consultation policy. Pt is scheduled to return Cardiac Rehab on 07/05/16 at 8:15. Pt was advised to come to class 15 minutes before class starts. Patient was also given instructions on meeting with the dietician and attending the Family Structure classes. Pt is eager to get started. Patient was able to complete 6 minute walk test. Patient was measured for the equipment. Discussed equipment safety with patient. Took patient pre-anthropometric measurements. Patient finished visit at 1545.

## 2016-06-25 HISTORY — PX: HAND SURGERY: SHX662

## 2016-07-05 ENCOUNTER — Encounter: Payer: 59 | Admitting: Internal Medicine

## 2016-07-05 ENCOUNTER — Encounter (HOSPITAL_COMMUNITY)
Admission: RE | Admit: 2016-07-05 | Discharge: 2016-07-05 | Disposition: A | Discharge status: Home / Self Care | Payer: 59 | Source: Ambulatory Visit | Attending: Cardiology | Admitting: Cardiology

## 2016-07-05 DIAGNOSIS — Z7901 Long term (current) use of anticoagulants: Secondary | ICD-10-CM | POA: Diagnosis not present

## 2016-07-05 DIAGNOSIS — Z95810 Presence of automatic (implantable) cardiac defibrillator: Secondary | ICD-10-CM | POA: Diagnosis not present

## 2016-07-05 DIAGNOSIS — I480 Paroxysmal atrial fibrillation: Secondary | ICD-10-CM | POA: Insufficient documentation

## 2016-07-05 DIAGNOSIS — I5040 Unspecified combined systolic (congestive) and diastolic (congestive) heart failure: Secondary | ICD-10-CM

## 2016-07-05 DIAGNOSIS — I5042 Chronic combined systolic (congestive) and diastolic (congestive) heart failure: Secondary | ICD-10-CM | POA: Insufficient documentation

## 2016-07-05 DIAGNOSIS — G4733 Obstructive sleep apnea (adult) (pediatric): Secondary | ICD-10-CM | POA: Insufficient documentation

## 2016-07-05 NOTE — Progress Notes (Signed)
Daily Session Note  Patient Details  Name: Brandon Torres MRN: [id number] Date of Birth: 05/30/1966 Referring Provider:   Flowsheet Row CARDIAC REHAB PHASE II ORIENTATION from 06/17/2016 in [place]  Referring Provider  Dr. Harl Bowie       Encounter Date: 07/05/2016  Check In:     Session Check In - 07/05/16 0815      Check-In   Location [facility]   Staff Present Aundra Dubin, RN, BSN;Diane Coad, MS, EP, [facility], Exercise Physiologist;Eshan Trupiano Luther Parody, BS, EP, Exercise Physiologist   Supervising physician immediately available to respond to emergencies See telemetry face sheet for immediately available MD   Medication changes reported     No   Fall or balance concerns reported    No   Warm-up and Cool-down Performed as group-led instruction   Resistance Training Performed Yes   VAD Patient? No     Pain Assessment   Currently in Pain? No/denies   Pain Score 0-No pain   Multiple Pain Sites No      Capillary Blood Glucose: No results found for this or any previous visit (from the past 24 hour(s)).   Goals Met:  Independence with exercise equipment Exercise tolerated well No report of cardiac concerns or symptoms Strength training completed today  Goals Unmet:  Not Applicable  Comments: Check out 915   Dr. Kate Sable is Medical Director for [place] and Pulmonary Rehab.

## 2016-07-06 ENCOUNTER — Encounter: Payer: Self-pay | Admitting: *Deleted

## 2016-07-07 ENCOUNTER — Encounter: Payer: Self-pay | Admitting: Cardiology

## 2016-07-07 ENCOUNTER — Ambulatory Visit (INDEPENDENT_AMBULATORY_CARE_PROVIDER_SITE_OTHER): Payer: 59 | Admitting: Cardiology

## 2016-07-07 ENCOUNTER — Encounter (HOSPITAL_COMMUNITY)
Admission: RE | Admit: 2016-07-07 | Discharge: 2016-07-07 | Disposition: A | Discharge status: Home / Self Care | Payer: 59 | Source: Ambulatory Visit | Attending: Cardiology | Admitting: Cardiology

## 2016-07-07 VITALS — BP 94/63 | HR 63 | Ht 72.0 in | Wt 323.0 lb

## 2016-07-07 DIAGNOSIS — I4891 Unspecified atrial fibrillation: Secondary | ICD-10-CM

## 2016-07-07 DIAGNOSIS — I5042 Chronic combined systolic (congestive) and diastolic (congestive) heart failure: Secondary | ICD-10-CM | POA: Diagnosis not present

## 2016-07-07 DIAGNOSIS — I10 Essential (primary) hypertension: Secondary | ICD-10-CM | POA: Diagnosis not present

## 2016-07-07 DIAGNOSIS — I5040 Unspecified combined systolic (congestive) and diastolic (congestive) heart failure: Secondary | ICD-10-CM

## 2016-07-07 DIAGNOSIS — G4733 Obstructive sleep apnea (adult) (pediatric): Secondary | ICD-10-CM | POA: Diagnosis not present

## 2016-07-07 NOTE — Patient Instructions (Signed)
Medication Instructions:  Continue all current medications.  Labwork:  BMET, Magnesium - orders given today.   Office will contact with results via phone or letter.    Testing/Procedures: none  Follow-Up: 3 months   Any Other Special Instructions Will Be Listed Below (If Applicable).  If you need a refill on your cardiac medications before your next appointment, please call your pharmacy.  

## 2016-07-07 NOTE — Progress Notes (Signed)
Daily Session Note  Patient Details  Name: Baylin D Peachey MRN: [id number] Date of Birth: 07/01/1966 Referring Provider:   Flowsheet Row CARDIAC REHAB PHASE II ORIENTATION from 06/17/2016 in [place] CARDIAC REHABILITATION  Referring Provider  Dr. Branch       Encounter Date: 07/07/2016  Check In:     Session Check In - 07/07/16 0815      Check-In   Location [facility]   Staff Present Diane Coad, MS, EP, [facility], Exercise Physiologist;Gregory Cowan, BS, EP, Exercise Physiologist;Debra Johnson, RN, BSN   Supervising physician immediately available to respond to emergencies See telemetry face sheet for immediately available MD   Medication changes reported     No   Fall or balance concerns reported    No   Warm-up and Cool-down Performed as group-led instruction   Resistance Training Performed Yes   VAD Patient? No     Pain Assessment   Pain Score 0-No pain   Multiple Pain Sites No      Capillary Blood Glucose: No results found for this or any previous visit (from the past 24 hour(s)).   Goals Met:  Independence with exercise equipment Exercise tolerated well No report of cardiac concerns or symptoms Strength training completed today  Goals Unmet:  Not Applicable  Comments: Check out 9:15   Dr. Suresh Koneswaran is Medical Director for [place] Cardiac and Pulmonary Rehab. 

## 2016-07-07 NOTE — Progress Notes (Signed)
Clinical Summary Brandon Torres is a 50 y.o.male seen today for follow up of the following medical problems.   1. Chronic systolic heart failure .  - echo 10/2014 shows severe drop in LVEF to 15-20%.  - cath Jan 2016 patent coronaries. RA 10, mean PA 21, PCWP 19  - repeat echo 02/2015 LVEF 20-25% - medication has been limited by borderline low bp's, low heart rates, and orthostatic symptoms - ICD followed by Dr Ladona Ridgelaylor with normal function by last check   - no recent SOB or DOE. Still with some LE edema. Taking lasix 40mg  prn, taking 3-4 days a week.    2. Paroxysmal afib  - followed by Dr Ladona Ridgelaylor, has had previous ablations x2. Was recently on tikosyn but continued to have paroxysmal aflutter, was changed to amio by afib clinic.  - on xarelto for stroke prevention. No bleeding issues on xarelto. He is on amiodarone.  - s/p DCCV 2/2/217 - now off amiodarone due to treatment failure  - no recent palpitations  3. OSA - difficulty using due to comfort issues.  - followed by Dr Juanetta GoslingHawkins, has f/u within next month    SH: has several animals (chickens, dogs, cats) that he takes care of. Has a beach house in [place] that he and his wife spend a lot of time at,plans to go there over Easter weekend.  Past Medical History:  Diagnosis Date  . AICD (automatic cardioverter/defibrillator) present   . CHF (congestive heart failure) (HCC)   . Chronic anticoagulation   . Dysrhythmia   . Morbid obesity (HCC)   . Non-ischemic cardiomyopathy (HCC)   . OSA on CPAP    "have mask; can't tolerate mask" (04/23/2015)  . PAF (paroxysmal atrial fibrillation) (HCC)    RFA at [facility]+ dofetilide     No Known Allergies   Current Outpatient Prescriptions  Medication Sig Dispense Refill  . acetaminophen (TYLENOL) 500 MG tablet Take 500 mg by mouth every 6 (six) hours as needed for mild pain or headache.     . furosemide (LASIX) 40 MG tablet Take 1 tab daily as needed for swelling 90  tablet 3  . metoprolol succinate (TOPROL XL) 100 MG 24 hr tablet Take 1 tablet (100 mg total) by mouth 2 (two) times daily. Take with or immediately following a meal. 180 tablet 3  . sacubitril-valsartan (ENTRESTO) 49-51 MG Take 1 tablet by mouth 2 (two) times daily. 180 tablet 3  . XARELTO 20 MG TABS tablet TAKE ONE TABLET BY MOUTH EVERY DAY WITH DINNER. 30 tablet 6   No current facility-administered medications for this visit.      Past Surgical History:  Procedure Laterality Date  . ATRIAL FIBRILLATION ABLATION  06/2004; ~ 2010   at [facility]  . CARDIAC CATHETERIZATION  10/2014  . CARDIAC DEFIBRILLATOR PLACEMENT  04/23/2015  . CARDIOVERSION N/A 11/27/2015   Procedure: CARDIOVERSION;  Surgeon: Thurmon FairMihai Croitoru, MD;  Location: [facility] ENDOSCOPY;  Service: Cardiovascular;  Laterality: N/A;  . EP IMPLANTABLE DEVICE N/A 04/23/2015   Procedure: ICD Implant;  Surgeon: Marinus MawGregg W Taylor, MD;  [facility];  Service: Cardiovascular;  Laterality: N/A;  . LEFT AND RIGHT HEART CATHETERIZATION WITH CORONARY ANGIOGRAM N/A 11/12/2014   Procedure: LEFT AND RIGHT HEART CATHETERIZATION WITH CORONARY ANGIOGRAM;  Surgeon: Marykay Lexavid W Harding, MD;  [facility];  Service: Cardiovascular;  Laterality: N/A;  . SKIN GRAFT Right 03/1993   [facility]     No Known Allergies  Family History  Problem Relation Age of Onset  . Prostate cancer Father   . Heart attack Paternal Grandfather      Social History Mr. Jacquot reports that he has never smoked. He has never used smokeless tobacco. Mr. Bady reports that he drinks alcohol.   Review of Systems CONSTITUTIONAL: No weight loss, fever, chills, weakness or fatigue.  HEENT: Eyes: No visual loss, blurred vision, double vision or yellow sclerae.No hearing loss, sneezing, congestion, runny nose or sore throat.  SKIN: No rash or itching.  CARDIOVASCULAR: per HPI RESPIRATORY: No shortness of breath, cough or sputum.  GASTROINTESTINAL: No  anorexia, nausea, vomiting or diarrhea. No abdominal pain or blood.  GENITOURINARY: No burning on urination, no polyuria NEUROLOGICAL: No headache, dizziness, syncope, paralysis, ataxia, numbness or tingling in the extremities. No change in bowel or bladder control.  MUSCULOSKELETAL: No muscle, back pain, joint pain or stiffness.  LYMPHATICS: No enlarged nodes. No history of splenectomy.  PSYCHIATRIC: No history of depression or anxiety.  ENDOCRINOLOGIC: No reports of sweating, cold or heat intolerance. No polyuria or polydipsia.  Marland Kitchen   Physical Examination Vitals:   07/07/16 1118  BP: 94/63  Pulse: 63   Vitals:   07/07/16 1118  Weight: (!) 323 lb (146.5 kg)  Height: 6' (1.829 m)    Gen: resting comfortably, no acute distress HEENT: no scleral icterus, pupils equal round and reactive, no palptable cervical adenopathy,  CV: RRR, no m/r/g, no jvd Resp: Clear to auscultation bilaterally GI: abdomen is soft, non-tender, non-distended, normal bowel sounds, no hepatosplenomegaly MSK: extremities are warm, no edema.  Skin: warm, no rash Neuro:  no focal deficits Psych: appropriate affect   Diagnostic Studies 02/2015 Echo Study Conclusions  - Left ventricle: The cavity size was severely dilated. Wall thickness was normal. Systolic function was severely reduced. The estimated ejection fraction was in the range of 20% to 25%. Diffusely hypokinetic. Features are consistent with a pseudonormal left ventricular filling pattern, with concomitant abnormal relaxation and increased filling pressure (grade 2 diastolic dysfunction). Doppler parameters are consistent with high ventricular filling pressure. - Mitral valve: Mildly calcified annulus. There was mild regurgitation. - Left atrium: The atrium was severely dilated. - Right ventricle: The cavity size was mildly dilated. Systolic function was mildly reduced. TAPSE appears overestimated, as there appears to be  reduced systolic function. - Right atrium: The atrium was mildly to moderately dilated. - Tricuspid valve: There was mild regurgitation.    Assessment and Plan  1. Chronic systolic HF - slow titration of medications given soft bp's. Soft bp's today, no further titration at this time - recheck BMET/Mg on diuretics  2. Parox afib - followed by EP. No recent symptoms - CHADS2Vasc score is 2, he remains on anticoagulation.   3. OSA - encouraged compliance, continue to follow with Dr Juanetta Gosling.    4. Preoperative evaluation - being considered for hand surgery. His heart failure is well compensated, recommend proceeding as planned   F/u 3 months      Antoine Poche, M.D.

## 2016-07-08 ENCOUNTER — Other Ambulatory Visit: Payer: Self-pay | Admitting: Cardiology

## 2016-07-08 LAB — BASIC METABOLIC PANEL
BUN: 16 mg/dL (ref 7–25)
CO2: 27 mmol/L (ref 20–31)
Calcium: 8.9 mg/dL (ref 8.6–10.3)
Chloride: 104 mmol/L (ref 98–110)
Creat: 0.85 mg/dL (ref 0.60–1.35)
Glucose, Bld: 89 mg/dL (ref 65–99)
Potassium: 5 mmol/L (ref 3.5–5.3)
Sodium: 139 mmol/L (ref 135–146)

## 2016-07-08 LAB — MAGNESIUM: Magnesium: 1.8 mg/dL (ref 1.5–2.5)

## 2016-07-09 ENCOUNTER — Telehealth: Payer: Self-pay | Admitting: *Deleted

## 2016-07-09 ENCOUNTER — Encounter (HOSPITAL_COMMUNITY)
Admission: RE | Admit: 2016-07-09 | Discharge: 2016-07-09 | Disposition: A | Discharge status: Home / Self Care | Payer: 59 | Source: Ambulatory Visit | Attending: Cardiology | Admitting: Cardiology

## 2016-07-09 DIAGNOSIS — I5040 Unspecified combined systolic (congestive) and diastolic (congestive) heart failure: Secondary | ICD-10-CM

## 2016-07-09 DIAGNOSIS — I5042 Chronic combined systolic (congestive) and diastolic (congestive) heart failure: Secondary | ICD-10-CM | POA: Diagnosis not present

## 2016-07-09 NOTE — Progress Notes (Signed)
Cardiac Individual Treatment Plan  Patient Details  Name: Brandon Torres MRN: [id number] Date of Birth: 1966/07/02 Referring Provider:   Flowsheet Row CARDIAC REHAB PHASE II ORIENTATION from 06/17/2016 in [facility] CARDIAC REHABILITATION  Referring Provider  Dr. Wyline Mood       Initial Encounter Date:  Flowsheet Row CARDIAC REHAB PHASE II ORIENTATION from 06/17/2016 in [place] CARDIAC REHABILITATION  Date  06/17/16  Referring Provider  Dr. Wyline Mood       Visit Diagnosis: Combined systolic and diastolic congestive heart failure, NYHA class 2 (HCC)  Patient's Home Medications on Admission:  Current Outpatient Prescriptions:  .  acetaminophen (TYLENOL) 500 MG tablet, Take 500 mg by mouth every 6 (six) hours as needed for mild pain or headache. , Disp: , Rfl:  .  furosemide (LASIX) 40 MG tablet, Take 1 tab daily as needed for swelling, Disp: 90 tablet, Rfl: 3 .  metoprolol succinate (TOPROL XL) 100 MG 24 hr tablet, Take 1 tablet (100 mg total) by mouth 2 (two) times daily. Take with or immediately following a meal., Disp: 180 tablet, Rfl: 3 .  sacubitril-valsartan (ENTRESTO) 49-51 MG, Take 1 tablet by mouth 2 (two) times daily., Disp: 180 tablet, Rfl: 3 .  XARELTO 20 MG TABS tablet, TAKE ONE TABLET BY MOUTH EVERY DAY WITH DINNER., Disp: 30 tablet, Rfl: 6  Past Medical History: Past Medical History:  Diagnosis Date  . AICD (automatic cardioverter/defibrillator) present   . CHF (congestive heart failure) (HCC)   . Chronic anticoagulation   . Dysrhythmia   . Morbid obesity (HCC)   . Non-ischemic cardiomyopathy (HCC)   . OSA on CPAP    "have mask; can't tolerate mask" (04/23/2015)  . PAF (paroxysmal atrial fibrillation) (HCC)    RFA at [facility]+ dofetilide    Tobacco Use: History  Smoking Status  . Never Smoker  Smokeless Tobacco  . Never Used    Labs: Recent Review Flowsheet Data    Labs for ITP Cardiac and Pulmonary Rehab Latest Ref Rng & Units 03/05/2008 01/04/2012 11/12/2014  11/12/2014 10/06/2015   Cholestrol 0 - 200 mg/dL 865 784 - - 696   LDLCALC 0 - 99 mg/dL 81 295(M) - - 841(L)   HDL >40 mg/dL 50 46 - - 48   Trlycerides <150 mg/dL 94 66 - - 81   Hemoglobin A1c 4.8 - 5.6 % - - - - 5.4   PHART 7.350 - 7.450 - - - 7.361 -   PCO2ART 35.0 - 45.0 mmHg - - - 40.6 -   HCO3 20.0 - 24.0 mEq/L - - 23.6 23.0 -   TCO2 0 - 100 mmol/L - - 25 24 -   ACIDBASEDEF 0.0 - 2.0 mmol/L - - 2.0 2.0 -   O2SAT % - - 69.0 98.0 -      Capillary Blood Glucose: No results found for: GLUCAP   Exercise Target Goals:    Exercise Program Goal: Individual exercise prescription set with THRR, safety & activity barriers. Participant demonstrates ability to understand and report RPE using BORG scale, to self-measure pulse accurately, and to acknowledge the importance of the exercise prescription.  Exercise Prescription Goal: Starting with aerobic activity 30 plus minutes a day, 3 days per week for initial exercise prescription. Provide home exercise prescription and guidelines that participant acknowledges understanding prior to discharge.  Activity Barriers & Risk Stratification:     Activity Barriers & Cardiac Risk Stratification - 06/17/16 1601      Activity Barriers & Cardiac Risk Stratification  Activity Barriers None   Cardiac Risk Stratification High      6 Minute Walk:     6 Minute Walk    Row Name 06/17/16 1543         6 Minute Walk   Phase Initial     Distance 1500 feet     Walk Time 6 minutes     # of Rest Breaks 0     MPH 2.84     METS 3.17     RPE 12     Perceived Dyspnea  13     VO2 Peak 13.11     Symptoms No     Resting HR 61 bpm     Resting BP 110/64     Max Ex. HR 137 bpm     Max Ex. BP 126/76     2 Minute Post BP 108/72        Initial Exercise Prescription:     Initial Exercise Prescription - 06/17/16 1500      Date of Initial Exercise RX and Referring Provider   Date 06/17/16   Referring Provider Dr. Wyline MoodBranch      Treadmill   MPH  1.3   Grade 0   Minutes 15   METs 1.9     NuStep   Level 2   Watts 15   Minutes 20   METs 1.9     Prescription Details   Frequency (times per week) 3   Duration Progress to 30 minutes of continuous aerobic without signs/symptoms of physical distress     Intensity   THRR REST +  30   THRR 40-80% of Max Heartrate 105-128-150   Ratings of Perceived Exertion 11-13   Perceived Dyspnea 0-4     Progression   Progression Continue progressive overload as per policy without signs/symptoms or physical distress.     Resistance Training   Training Prescription Yes   Weight 1   Reps 10-12      Perform Capillary Blood Glucose checks as needed.  Exercise Prescription Changes:      Exercise Prescription Changes    Row Name 07/08/16 0800             Exercise Review   Progression Yes         Response to Exercise   Blood Pressure (Admit) 106/62       Blood Pressure (Exercise) 142/78       Blood Pressure (Exit) 106/72       Heart Rate (Admit) 60 bpm       Heart Rate (Exercise) 100 bpm       Heart Rate (Exit) 67 bpm       Rating of Perceived Exertion (Exercise) 9       Duration Progress to 30 minutes of continuous aerobic without signs/symptoms of physical distress       Intensity Rest + 30         Progression   Progression Continue progressive overload as per policy without signs/symptoms or physical distress.         Resistance Training   Training Prescription Yes       Weight 2       Reps 10-12         Treadmill   MPH 1.8       Grade 0       Minutes 15       METs 2.3         NuStep   Level 2  Watts 26       Minutes 20       METs 3.69         Home Exercise Plan   Plans to continue exercise at Home       Frequency Add 2 additional days to program exercise sessions.          Exercise Comments:      Exercise Comments    Row Name 07/08/16 1610           Exercise Comments Patient is proggressing appropriately           Discharge Exercise  Prescription (Final Exercise Prescription Changes):     Exercise Prescription Changes - 07/08/16 0800      Exercise Review   Progression Yes     Response to Exercise   Blood Pressure (Admit) 106/62   Blood Pressure (Exercise) 142/78   Blood Pressure (Exit) 106/72   Heart Rate (Admit) 60 bpm   Heart Rate (Exercise) 100 bpm   Heart Rate (Exit) 67 bpm   Rating of Perceived Exertion (Exercise) 9   Duration Progress to 30 minutes of continuous aerobic without signs/symptoms of physical distress   Intensity Rest + 30     Progression   Progression Continue progressive overload as per policy without signs/symptoms or physical distress.     Resistance Training   Training Prescription Yes   Weight 2   Reps 10-12     Treadmill   MPH 1.8   Grade 0   Minutes 15   METs 2.3     NuStep   Level 2   Watts 26   Minutes 20   METs 3.69     Home Exercise Plan   Plans to continue exercise at Home   Frequency Add 2 additional days to program exercise sessions.      Nutrition:  Target Goals: Understanding of nutrition guidelines, daily intake of sodium 1500mg , cholesterol 200mg , calories 30% from fat and 7% or less from saturated fats, daily to have 5 or more servings of fruits and vegetables.  Biometrics:     Pre Biometrics - 06/17/16 1542      Pre Biometrics   Height 6' (1.829 m)   Waist Circumference 50.5 inches   Hip Circumference 51 inches   Waist to Hip Ratio 0.99 %   BMI (Calculated) 43.9   Triceps Skinfold 18 mm   % Body Fat 38 %   Grip Strength 129.7 kg   Flexibility 0 in   Single Leg Stand 37 seconds       Nutrition Therapy Plan and Nutrition Goals:   Nutrition Discharge: Rate Your Plate Scores:     Nutrition Assessments - 06/17/16 1607      MEDFICTS Scores   Pre Score 48      Nutrition Goals Re-Evaluation:   Psychosocial: Target Goals: Acknowledge presence or absence of depression, maximize coping skills, provide positive support system.  Participant is able to verbalize types and ability to use techniques and skills needed for reducing stress and depression.  Initial Review & Psychosocial Screening:     Initial Psych Review & Screening - 06/17/16 1615      Initial Review   Current issues with --  Patient does not have any psychosocial issues or concerns.      Family Dynamics   Good Support System? Yes     Barriers   Psychosocial barriers to participate in program There are no identifiable barriers or psychosocial needs.  Screening Interventions   Interventions Encouraged to exercise      Quality of Life Scores:     Quality of Life - 06/17/16 1546      Quality of Life Scores   Health/Function Pre 17.83 %   Socioeconomic Pre 20.93 %   Psych/Spiritual Pre 21.14 %   Family Pre 23.75 %   GLOBAL Pre 19.91 %      PHQ-9: Recent Review Flowsheet Data    Depression screen [facility] 2/9 06/17/2016   Decreased Interest 0   Down, Depressed, Hopeless 0   PHQ - 2 Score 0   Altered sleeping 0   Tired, decreased energy 1   Change in appetite 0   Feeling bad or failure about yourself  0   Trouble concentrating 0   Moving slowly or fidgety/restless 0   Suicidal thoughts 0   PHQ-9 Score 1   Difficult doing work/chores Not difficult at all      Psychosocial Evaluation and Intervention:     Psychosocial Evaluation - 06/17/16 1617      Psychosocial Evaluation & Interventions   Interventions Encouraged to exercise with the program and follow exercise prescription   Continued Psychosocial Services Needed No      Psychosocial Re-Evaluation:     Psychosocial Re-Evaluation    Row Name 07/09/16 1449             Psychosocial Re-Evaluation   Interventions Encouraged to attend Cardiac Rehabilitation for the exercise       Comments Patient's QOL score was 19.91 and his PHQ-9 score was 1. He says he is not depressed and does not need counseling.           Vocational Rehabilitation: Provide vocational rehab  assistance to qualifying candidates.   Vocational Rehab Evaluation & Intervention:     Vocational Rehab - 06/17/16 1607      Initial Vocational Rehab Evaluation & Intervention   Assessment shows need for Vocational Rehabilitation No      Education: Education Goals: Education classes will be provided on a weekly basis, covering required topics. Participant will state understanding/return demonstration of topics presented.  Learning Barriers/Preferences:     Learning Barriers/Preferences - 06/17/16 1606      Learning Barriers/Preferences   Learning Barriers None   Learning Preferences Skilled Demonstration      Education Topics: Hypertension, Hypertension Reduction -Define heart disease and high blood pressure. Discus how high blood pressure affects the body and ways to reduce high blood pressure.   Exercise and Your Heart -Discuss why it is important to exercise, the FITT principles of exercise, normal and abnormal responses to exercise, and how to exercise safely.   Angina -Discuss definition of angina, causes of angina, treatment of angina, and how to decrease risk of having angina.   Cardiac Medications -Review what the following cardiac medications are used for, how they affect the body, and side effects that may occur when taking the medications.  Medications include Aspirin, Beta blockers, calcium channel blockers, ACE Inhibitors, angiotensin receptor blockers, diuretics, digoxin, and antihyperlipidemics.   Congestive Heart Failure -Discuss the definition of CHF, how to live with CHF, the signs and symptoms of CHF, and how keep track of weight and sodium intake.   Heart Disease and Intimacy -Discus the effect sexual activity has on the heart, how changes occur during intimacy as we age, and safety during sexual activity.   Smoking Cessation / COPD -Discuss different methods to quit smoking, the health benefits of quitting smoking, and  the definition of  COPD. Flowsheet Row CARDIAC REHAB PHASE II EXERCISE from 07/07/2016 in [place] CARDIAC REHABILITATION  Date  07/07/16  Educator  DJ  Instruction Review Code  2- meets goals/outcomes      Nutrition I: Fats -Discuss the types of cholesterol, what cholesterol does to the heart, and how cholesterol levels can be controlled.   Nutrition II: Labels -Discuss the different components of food labels and how to read food label   Heart Parts and Heart Disease -Discuss the anatomy of the heart, the pathway of blood circulation through the heart, and these are affected by heart disease.   Stress I: Signs and Symptoms -Discuss the causes of stress, how stress may lead to anxiety and depression, and ways to limit stress.   Stress II: Relaxation -Discuss different types of relaxation techniques to limit stress.   Warning Signs of Stroke / TIA -Discuss definition of a stroke, what the signs and symptoms are of a stroke, and how to identify when someone is having stroke.   Knowledge Questionnaire Score:     Knowledge Questionnaire Score - 06/17/16 1606      Knowledge Questionnaire Score   Pre Score 22/24      Core Components/Risk Factors/Patient Goals at Admission:     Personal Goals and Risk Factors at Admission - 06/17/16 1608      Core Components/Risk Factors/Patient Goals on Admission    Weight Management Yes   Intervention Weight Management: Develop a combined nutrition and exercise program designed to reach desired caloric intake, while maintaining appropriate intake of nutrient and fiber, sodium and fats, and appropriate energy expenditure required for the weight goal.;Weight Management/Obesity: Establish reasonable short term and long term weight goals.   Admit Weight 323 lb (146.5 kg)   Goal Weight: Short Term 318 lb (144.2 kg)   Goal Weight: Long Term 303 lb (137.4 kg)   Expected Outcomes Short Term: Continue to assess and modify interventions until short term weight is  achieved;Long Term: Adherence to nutrition and physical activity/exercise program aimed toward attainment of established weight goal   Sedentary Yes   Intervention Provide advice, education, support and counseling about physical activity/exercise needs.;Develop an individualized exercise prescription for aerobic and resistive training based on initial evaluation findings, risk stratification, comorbidities and participant's personal goals.   Expected Outcomes Achievement of increased cardiorespiratory fitness and enhanced flexibility, muscular endurance and strength shown through measurements of functional capacity and personal statement of participant.   Increase Strength and Stamina Yes   Intervention Provide advice, education, support and counseling about physical activity/exercise needs.;Develop an individualized exercise prescription for aerobic and resistive training based on initial evaluation findings, risk stratification, comorbidities and participant's personal goals.   Expected Outcomes Achievement of increased cardiorespiratory fitness and enhanced flexibility, muscular endurance and strength shown through measurements of functional capacity and personal statement of participant.   Heart Failure Yes   Intervention Provide a combined exercise and nutrition program that is supplemented with education, support and counseling about heart failure. Directed toward relieving symptoms such as shortness of breath, decreased exercise tolerance, and extremity edema.   Expected Outcomes Improve functional capacity of life;Short term: Daily weights obtained and reported for increase. Utilizing diuretic protocols set by physician.;Long term: Adoption of self-care skills and reduction of barriers for early signs and symptoms recognition and intervention leading to self-care maintenance.   Personal Goal Other Yes   Personal Goal Get heart built up again and lose 20 lbs.    Intervention Patient will  exercise  3 days/week in program and supplement with 2 days/week of excerise at home.    Expected Outcomes Patient will reach the above stated goals.       Core Components/Risk Factors/Patient Goals Review:      Goals and Risk Factor Review    Row Name 06/17/16 1615 07/09/16 1446           Core Components/Risk Factors/Patient Goals Review   Personal Goals Review Weight Management/Obesity;Increase Strength and Stamina;Heart Failure Weight Management/Obesity;Increase Strength and Stamina;Other  Get heart built up again.       Review  -- Patient has had 4 sessions. He says it is too soon to tell if program has improved his strength. Wil continue to progress.      Expected Outcomes  -- Patient will continue to attend sessions improving his strength and stamina and meet his weight loss goal of 20 lbs.          Core Components/Risk Factors/Patient Goals at Discharge (Final Review):      Goals and Risk Factor Review - 07/09/16 1446      Core Components/Risk Factors/Patient Goals Review   Personal Goals Review Weight Management/Obesity;Increase Strength and Stamina;Other  Get heart built up again.    Review Patient has had 4 sessions. He says it is too soon to tell if program has improved his strength. Wil continue to progress.   Expected Outcomes Patient will continue to attend sessions improving his strength and stamina and meet his weight loss goal of 20 lbs.       ITP Comments:   Comments: 30 Day Review: Patient doing well with program. Will continue to monitor for progress.

## 2016-07-09 NOTE — Telephone Encounter (Signed)
-----   Message from Antoine Poche, MD sent at 07/09/2016 10:39 AM EDT ----- Can we forward my clinic note to his hand suregon Dr Mina Marble in [place]

## 2016-07-09 NOTE — Telephone Encounter (Signed)
Done

## 2016-07-09 NOTE — Progress Notes (Signed)
Daily Session Note  Patient Details  Name: Brandon Torres MRN: [id number] Date of Birth: 03-Jan-1966 Referring Provider:   Flowsheet Row CARDIAC REHAB PHASE II ORIENTATION from 06/17/2016 in [place]  Referring Provider  Dr. Harl Bowie       Encounter Date: 07/09/2016  Check In:     Session Check In - 07/09/16 0815      Check-In   Location [facility]   Staff Present Minsa Weddington Angelina Pih, MS, EP, [facility], Exercise Physiologist;Gregory Luther Parody, BS, EP, Exercise Physiologist;Debra Wynetta Emery, RN, BSN   Supervising physician immediately available to respond to emergencies See telemetry face sheet for immediately available MD   Medication changes reported     No   Fall or balance concerns reported    No   Warm-up and Cool-down Performed as group-led instruction   Resistance Training Performed Yes   VAD Patient? No     Pain Assessment   Currently in Pain? No/denies   Pain Score 0-No pain   Multiple Pain Sites No      Capillary Blood Glucose: Results for orders placed or performed in visit on 07/08/16 (from the past 24 hour(s))  Basic metabolic panel     Status: None   Collection Time: 07/08/16 11:10 AM  Result Value Ref Range   Sodium 139 135 - 146 mmol/L   Potassium 5.0 3.5 - 5.3 mmol/L   Chloride 104 98 - 110 mmol/L   CO2 27 20 - 31 mmol/L   Glucose, Bld 89 65 - 99 mg/dL   BUN 16 7 - 25 mg/dL   Creat 0.85 0.60 - 1.35 mg/dL   Calcium 8.9 8.6 - 10.3 mg/dL   Narrative   Performed at:  [address]                [place], [place] [address] Joylene Igo [phone]  Magnesium     Status: None   Collection Time: 07/08/16 11:10 AM  Result Value Ref Range   Magnesium 1.8 1.5 - 2.5 mg/dL   Narrative   Performed at:  [address], [address]                [place], [place] [address]     Goals Met:  Independence with exercise equipment Exercise tolerated well No report of  cardiac concerns or symptoms Strength training completed today  Goals Unmet:  Not Applicable  Comments: Check out: 0915   Dr. Kate Sable is Medical Director for [place] and Pulmonary Rehab.

## 2016-07-12 ENCOUNTER — Encounter (HOSPITAL_COMMUNITY)
Admission: RE | Admit: 2016-07-12 | Discharge: 2016-07-12 | Disposition: A | Discharge status: Home / Self Care | Payer: 59 | Source: Ambulatory Visit | Attending: Cardiology | Admitting: Cardiology

## 2016-07-12 ENCOUNTER — Telehealth: Payer: Self-pay | Admitting: Cardiology

## 2016-07-12 DIAGNOSIS — I5040 Unspecified combined systolic (congestive) and diastolic (congestive) heart failure: Secondary | ICD-10-CM

## 2016-07-12 DIAGNOSIS — I5042 Chronic combined systolic (congestive) and diastolic (congestive) heart failure: Secondary | ICD-10-CM | POA: Diagnosis not present

## 2016-07-12 NOTE — Telephone Encounter (Signed)
Whether to stop the blood thinner or not for a surgery is up to the surgeon. From ou standpoint we are ok if they want him to be off it, typically its held 2 days prior to surgery and resumed day after. They need to call there suregeon to clarify.   Dominga Ferry MD

## 2016-07-12 NOTE — Telephone Encounter (Signed)
-----   Message from Antoine Poche, MD sent at 07/12/2016 11:34 AM EDT ----- Labs look good  Dominga Ferry MD

## 2016-07-12 NOTE — Progress Notes (Signed)
Daily Session Note  Patient Details  Name: PAYNE GARSKE MRN: [id number] Date of Birth: 10-02-1966 Referring Provider:   Flowsheet Row CARDIAC REHAB PHASE II ORIENTATION from 06/17/2016 in [facility]  Referring Provider  Dr. Harl Bowie       Encounter Date: 07/12/2016  Check In:     Session Check In - 07/12/16 0815      Check-In   Location [facility]   Staff Present Russella Dar, MS, EP, [facility], Exercise Physiologist;Katrina Daddona Luther Parody, BS, EP, Exercise Physiologist   Supervising physician immediately available to respond to emergencies See telemetry face sheet for immediately available MD   Medication changes reported     No   Fall or balance concerns reported    No   Warm-up and Cool-down Performed as group-led instruction   Resistance Training Performed Yes   VAD Patient? No     Pain Assessment   Currently in Pain? No/denies   Pain Score 0-No pain   Multiple Pain Sites No      Capillary Blood Glucose: No results found for this or any previous visit (from the past 24 hour(s)).   Goals Met:  Independence with exercise equipment Exercise tolerated well No report of cardiac concerns or symptoms Strength training completed today  Goals Unmet:  Not Applicable  Comments: Check out 915   Dr. Kate Sable is Medical Director for [place] and Pulmonary Rehab.

## 2016-07-12 NOTE — Telephone Encounter (Signed)
Patient informed and verbalized understanding

## 2016-07-12 NOTE — Telephone Encounter (Signed)
Brandon Torres is scheduled for surgery on Wednesday.  His wife called wanting to know if he was supposed to go off of the blood Thinner.

## 2016-07-12 NOTE — Telephone Encounter (Signed)
Left message to return call 

## 2016-07-13 ENCOUNTER — Other Ambulatory Visit: Payer: Self-pay | Admitting: Orthopedic Surgery

## 2016-07-14 ENCOUNTER — Encounter (HOSPITAL_COMMUNITY)
Admission: RE | Admit: 2016-07-14 | Discharge: 2016-07-14 | Disposition: A | Discharge status: Home / Self Care | Payer: 59 | Source: Ambulatory Visit | Attending: Cardiology | Admitting: Cardiology

## 2016-07-14 DIAGNOSIS — I5042 Chronic combined systolic (congestive) and diastolic (congestive) heart failure: Secondary | ICD-10-CM | POA: Diagnosis not present

## 2016-07-14 DIAGNOSIS — I5040 Unspecified combined systolic (congestive) and diastolic (congestive) heart failure: Secondary | ICD-10-CM

## 2016-07-14 NOTE — Progress Notes (Signed)
Daily Session Note  Patient Details  Name: Brandon Torres MRN: [id number] Date of Birth: 11/24/1965 Referring Provider:   Flowsheet Row CARDIAC REHAB PHASE II ORIENTATION from 06/17/2016 in [facility] CARDIAC REHABILITATION  Referring Provider  Dr. Branch       Encounter Date: 07/14/2016  Check In:     Session Check In - 07/14/16 0815      Check-In   Location [facility]   Staff Present Diane Coad, MS, EP, [facility], Exercise Physiologist;Avaleigh Decuir, BS, EP, Exercise Physiologist   Supervising physician immediately available to respond to emergencies See telemetry face sheet for immediately available MD   Medication changes reported     No   Fall or balance concerns reported    No   Warm-up and Cool-down Performed as group-led instruction   Resistance Training Performed Yes   VAD Patient? No     Pain Assessment   Currently in Pain? No/denies   Pain Score 0-No pain   Multiple Pain Sites No      Capillary Blood Glucose: No results found for this or any previous visit (from the past 24 hour(s)).   Goals Met:  Independence with exercise equipment Exercise tolerated well No report of cardiac concerns or symptoms Strength training completed today  Goals Unmet:  Not Applicable  Comments: Check out 915   Dr. Suresh Koneswaran is Medical Director for [name] and Pul[facility] Rehab. 

## 2016-07-16 ENCOUNTER — Encounter (HOSPITAL_COMMUNITY)
Admission: RE | Admit: 2016-07-16 | Discharge: 2016-07-16 | Disposition: A | Discharge status: Home / Self Care | Payer: 59 | Source: Ambulatory Visit | Attending: Cardiology | Admitting: Cardiology

## 2016-07-16 DIAGNOSIS — I5040 Unspecified combined systolic (congestive) and diastolic (congestive) heart failure: Secondary | ICD-10-CM

## 2016-07-16 DIAGNOSIS — I5042 Chronic combined systolic (congestive) and diastolic (congestive) heart failure: Secondary | ICD-10-CM | POA: Diagnosis not present

## 2016-07-16 NOTE — Progress Notes (Signed)
Daily Session Note  Patient Details  Name: Brandon Torres MRN: [id number] Date of Birth: 18-Nov-1965 Referring Provider:   Flowsheet Row CARDIAC REHAB PHASE II ORIENTATION from 06/17/2016 in [facility]  Referring Provider  Dr. Harl Bowie       Encounter Date: 07/16/2016  Check In:     Session Check In - 07/16/16 0815      Check-In   Location [facility]   Staff Present Aundra Dubin, RN, BSN;Emilyann Banka Luther Parody, BS, EP, Exercise Physiologist   Supervising physician immediately available to respond to emergencies See telemetry face sheet for immediately available MD   Medication changes reported     No   Fall or balance concerns reported    No   Warm-up and Cool-down Performed as group-led instruction   Resistance Training Performed Yes   VAD Patient? No     Pain Assessment   Currently in Pain? No/denies   Pain Score 0-No pain   Multiple Pain Sites No      Capillary Blood Glucose: No results found for this or any previous visit (from the past 24 hour(s)).   Goals Met:  Independence with exercise equipment Exercise tolerated well No report of cardiac concerns or symptoms Strength training completed today  Goals Unmet:  Not Applicable  Comments: Check out 915   Dr. Kate Sable is Medical Director for [place] and Pulmonary Rehab.

## 2016-07-19 ENCOUNTER — Encounter (HOSPITAL_COMMUNITY)
Admission: RE | Admit: 2016-07-19 | Discharge: 2016-07-19 | Disposition: A | Discharge status: Home / Self Care | Payer: 59 | Source: Ambulatory Visit | Attending: Cardiology | Admitting: Cardiology

## 2016-07-19 DIAGNOSIS — I5042 Chronic combined systolic (congestive) and diastolic (congestive) heart failure: Secondary | ICD-10-CM | POA: Diagnosis not present

## 2016-07-19 NOTE — Progress Notes (Signed)
Daily Session Note  Patient Details  Name: Brandon Torres MRN: [id number] Date of Birth: 29-Jan-1966 Referring Provider:   Flowsheet Row CARDIAC REHAB PHASE II ORIENTATION from 06/17/2016 in [facility]  Referring Provider  Dr. Harl Bowie       Encounter Date: 07/19/2016  Check In:     Session Check In - 07/19/16 0815      Check-In   Location [facility]   Staff Present Russella Dar, MS, EP, [facility], Exercise Physiologist;Gregory Luther Parody, BS, EP, Exercise Physiologist   Supervising physician immediately available to respond to emergencies See telemetry face sheet for immediately available MD   Medication changes reported     No   Fall or balance concerns reported    No   Warm-up and Cool-down Performed as group-led instruction   Resistance Training Performed Yes   VAD Patient? No     Pain Assessment   Currently in Pain? No/denies   Pain Score 0-No pain   Multiple Pain Sites No      Capillary Blood Glucose: No results found for this or any previous visit (from the past 24 hour(s)).   Goals Met:  Independence with exercise equipment Exercise tolerated well No report of cardiac concerns or symptoms Strength training completed today  Goals Unmet:  Not Applicable  Comments: Check out: 0915   Dr. Kate Sable is Medical Director for [name] and Pulmonary Rehab.

## 2016-07-21 ENCOUNTER — Encounter (HOSPITAL_COMMUNITY)
Admission: RE | Admit: 2016-07-21 | Discharge: 2016-07-21 | Disposition: A | Discharge status: Home / Self Care | Payer: 59 | Source: Ambulatory Visit | Attending: Cardiology | Admitting: Cardiology

## 2016-07-21 DIAGNOSIS — I5042 Chronic combined systolic (congestive) and diastolic (congestive) heart failure: Secondary | ICD-10-CM | POA: Diagnosis not present

## 2016-07-21 DIAGNOSIS — I5040 Unspecified combined systolic (congestive) and diastolic (congestive) heart failure: Secondary | ICD-10-CM

## 2016-07-21 NOTE — Progress Notes (Signed)
Daily Session Note  Patient Details  Name: Brandon Torres MRN: [id number] Date of Birth: [birth year] Referring Provider:   Flowsheet Row CARDIAC REHAB PHASE II ORIENTATION from 06/17/2016 in [facility]  Referring Provider  Dr. Harl Bowie       Encounter Date: 07/21/2016  Check In:     Session Check In - 07/21/16 0815      Check-In   Location [facility]   Staff Present Russella Dar, MS, EP, [facility], Exercise Physiologist;Debra Wynetta Emery, RN, BSN;Odaly Peri, BS, EP, Exercise Physiologist   Supervising physician immediately available to respond to emergencies See telemetry face sheet for immediately available MD   Medication changes reported     No   Fall or balance concerns reported    No   Warm-up and Cool-down Performed as group-led instruction   Resistance Training Performed Yes   VAD Patient? No     Pain Assessment   Currently in Pain? No/denies   Pain Score 0-No pain   Multiple Pain Sites No      Capillary Blood Glucose: No results found for this or any previous visit (from the past 24 hour(s)).   Goals Met:  Independence with exercise equipment Exercise tolerated well No report of cardiac concerns or symptoms Strength training completed today  Goals Unmet:  Not Applicable  Comments: Check out 915   Dr. Kate Sable is Medical Director for [place] and Pulmonary Rehab.

## 2016-07-23 ENCOUNTER — Encounter (HOSPITAL_COMMUNITY)
Admission: RE | Admit: 2016-07-23 | Discharge: 2016-07-23 | Disposition: A | Discharge status: Home / Self Care | Payer: 59 | Source: Ambulatory Visit | Attending: Cardiology | Admitting: Cardiology

## 2016-07-23 DIAGNOSIS — I5042 Chronic combined systolic (congestive) and diastolic (congestive) heart failure: Secondary | ICD-10-CM | POA: Diagnosis not present

## 2016-07-23 DIAGNOSIS — I5040 Unspecified combined systolic (congestive) and diastolic (congestive) heart failure: Secondary | ICD-10-CM

## 2016-07-23 NOTE — Progress Notes (Signed)
Daily Session Note  Patient Details  Name: Brandon Torres MRN: [id number] Date of Birth: 1965-12-28 Referring Provider:   Flowsheet Row CARDIAC REHAB PHASE II ORIENTATION from 06/17/2016 in [place]  Referring Provider  Dr. Harl Bowie       Encounter Date: 07/23/2016  Check In:     Session Check In - 07/23/16 0815      Check-In   Location [facility]   Staff Present Russella Dar, MS, EP, [facility], Exercise Physiologist;Arisha Gervais Luther Parody, BS, EP, Exercise Physiologist   Supervising physician immediately available to respond to emergencies See telemetry face sheet for immediately available MD   Medication changes reported     No   Fall or balance concerns reported    No   Warm-up and Cool-down Performed as group-led instruction   Resistance Training Performed Yes   VAD Patient? No     Pain Assessment   Currently in Pain? No/denies   Pain Score 0-No pain   Multiple Pain Sites No      Capillary Blood Glucose: No results found for this or any previous visit (from the past 24 hour(s)).   Goals Met:  Independence with exercise equipment Exercise tolerated well No report of cardiac concerns or symptoms Strength training completed today  Goals Unmet:  Not Applicable  Comments: Check out 915   Dr. Kate Sable is Medical Director for [place] and Pulmonary Rehab.

## 2016-07-26 ENCOUNTER — Encounter (HOSPITAL_COMMUNITY)
Admission: RE | Admit: 2016-07-26 | Discharge: 2016-07-26 | Disposition: A | Discharge status: Home / Self Care | Payer: 59 | Source: Ambulatory Visit | Attending: Cardiology | Admitting: Cardiology

## 2016-07-26 DIAGNOSIS — Z95810 Presence of automatic (implantable) cardiac defibrillator: Secondary | ICD-10-CM | POA: Diagnosis not present

## 2016-07-26 DIAGNOSIS — I480 Paroxysmal atrial fibrillation: Secondary | ICD-10-CM | POA: Insufficient documentation

## 2016-07-26 DIAGNOSIS — G4733 Obstructive sleep apnea (adult) (pediatric): Secondary | ICD-10-CM | POA: Insufficient documentation

## 2016-07-26 DIAGNOSIS — I5042 Chronic combined systolic (congestive) and diastolic (congestive) heart failure: Secondary | ICD-10-CM | POA: Insufficient documentation

## 2016-07-26 DIAGNOSIS — Z7901 Long term (current) use of anticoagulants: Secondary | ICD-10-CM | POA: Insufficient documentation

## 2016-07-26 DIAGNOSIS — I5040 Unspecified combined systolic (congestive) and diastolic (congestive) heart failure: Secondary | ICD-10-CM

## 2016-07-26 NOTE — Pre-Procedure Instructions (Signed)
Brandon Torres  07/26/2016      [facility] - [place], [place] - [address] [place] [address] Phone: [phone] Fax: [phone]    Your procedure is scheduled on Monday, October 9th, 2017.  Report to [facility] Admitting at 5:30 A.M.   Call this number if you have problems the morning of surgery:  [phone]   Remember:  Do not eat food or drink liquids after midnight.   Take these medicines the morning of surgery with A SIP OF WATER:  Acetaminophen (Tylenol) if needed, Metoprolol Succinate (Toprol-XL).    Follow MD's instructions on Xarelto and stop Xarelto 2 days prior to surgery.  (Last dose on Friday, October 6th).  Stop taking: Aspirin, NSAIDS, Aleve, Naproxen, Ibuprofen, Advil, Motrin, BC's, Goody's, Fish oil, all herbal medications, and all vitamins.     Do not wear jewelry.  Do not wear lotions, powders, or colognes, or deoderant.  Men may shave face and neck.  Do not bring valuables to the hospital.  [facility] is not responsible for any belongings or valuables.  Contacts, dentures or bridgework may not be worn into surgery.  Leave your suitcase in the car.  After surgery it may be brought to your room.  For patients admitted to the hospital, discharge time will be determined by your treatment team.  Patients discharged the day of surgery will not be allowed to drive home.   Special instructions:  Preparing for Surgery.    Please read over the following fact sheets that you were given. Incentive Spirometry.   [facility]- Preparing For Surgery  Before surgery, you can play an important role. Because skin is not sterile, your skin needs to be as free of germs as possible. You can reduce the number of germs on your skin by washing with CHG (chlorahexidine gluconate) Soap before surgery.  CHG is an antiseptic cleaner which kills germs and bonds with the skin to continue killing germs even after washing.  Please  do not use if you have an allergy to CHG or antibacterial soaps. If your skin becomes reddened/irritated stop using the CHG.  Do not shave (including legs and underarms) for at least 48 hours prior to first CHG shower. It is OK to shave your face.  Please follow these instructions carefully.   1. Shower the NIGHT BEFORE SURGERY and the MORNING OF SURGERY with CHG.   2. If you chose to wash your hair, wash your hair first as usual with your normal shampoo.  3. After you shampoo, rinse your hair and body thoroughly to remove the shampoo.  4. Use CHG as you would any other liquid soap. You can apply CHG directly to the skin and wash gently with a scrungie or a clean washcloth.   5. Apply the CHG Soap to your body ONLY FROM THE NECK DOWN.  Do not use on open wounds or open sores. Avoid contact with your eyes, ears, mouth and genitals (private parts). Wash genitals (private parts) with your normal soap.  6. Wash thoroughly, paying special attention to the area where your surgery will be performed.  7. Thoroughly rinse your body with warm water from the neck down.  8. DO NOT shower/wash with your normal soap after using and rinsing off the CHG Soap.  9. Pat yourself dry with a CLEAN TOWEL.   10. Wear CLEAN PAJAMAS   11. Place CLEAN SHEETS on your bed the night of your  first shower and DO NOT SLEEP WITH PETS.  Day of Surgery: Do not apply any deodorants/lotions. Please wear clean clothes to the hospital/surgery center.

## 2016-07-26 NOTE — Progress Notes (Signed)
Daily Session Note  Patient Details  Name: Brandon Torres MRN: [id number] Date of Birth: 08-18-1966 Referring Provider:   Flowsheet Row CARDIAC REHAB PHASE II ORIENTATION from 06/17/2016 in [facility]  Referring Provider  Dr. Harl Bowie       Encounter Date: 07/26/2016  Check In:     Session Check In - 07/26/16 0815      Check-In   Location [facility]   Staff Present Russella Dar, MS, EP, [facility], Exercise Physiologist;Elroy Schembri Wynetta Emery, RN, BSN   Supervising physician immediately available to respond to emergencies See telemetry face sheet for immediately available MD   Medication changes reported     No   Fall or balance concerns reported    No   Warm-up and Cool-down Performed as group-led instruction   Resistance Training Performed Yes   VAD Patient? No     Pain Assessment   Currently in Pain? No/denies   Pain Score 0-No pain   Multiple Pain Sites No      Capillary Blood Glucose: No results found for this or any previous visit (from the past 24 hour(s)).   Goals Met:  Independence with exercise equipment Exercise tolerated well No report of cardiac concerns or symptoms Strength training completed today  Goals Unmet:  Not Applicable  Comments: Check out 915.   Dr. Kate Sable is Medical Director for [facility] Cardiac and Pulmonary Rehab.

## 2016-07-27 ENCOUNTER — Encounter (HOSPITAL_COMMUNITY)
Admission: RE | Admit: 2016-07-27 | Discharge: 2016-07-27 | Disposition: A | Discharge status: Home / Self Care | Payer: 59 | Source: Ambulatory Visit | Attending: Orthopedic Surgery | Admitting: Orthopedic Surgery

## 2016-07-27 ENCOUNTER — Encounter (HOSPITAL_COMMUNITY): Payer: Self-pay

## 2016-07-27 DIAGNOSIS — Z01818 Encounter for other preprocedural examination: Secondary | ICD-10-CM | POA: Insufficient documentation

## 2016-07-27 DIAGNOSIS — M720 Palmar fascial fibromatosis [Dupuytren]: Secondary | ICD-10-CM | POA: Diagnosis not present

## 2016-07-27 DIAGNOSIS — Z01812 Encounter for preprocedural laboratory examination: Secondary | ICD-10-CM | POA: Diagnosis not present

## 2016-07-27 HISTORY — DX: Nausea with vomiting, unspecified: R11.2

## 2016-07-27 HISTORY — DX: Presence of spectacles and contact lenses: Z97.3

## 2016-07-27 HISTORY — DX: Other specified postprocedural states: Z98.890

## 2016-07-27 LAB — CBC
HCT: 48 % (ref 39.0–52.0)
Hemoglobin: 15.8 g/dL (ref 13.0–17.0)
MCH: 30.2 pg (ref 26.0–34.0)
MCHC: 32.9 g/dL (ref 30.0–36.0)
MCV: 91.8 fL (ref 78.0–100.0)
Platelets: 157 10*3/uL (ref 150–400)
RBC: 5.23 MIL/uL (ref 4.22–5.81)
RDW: 12.7 % (ref 11.5–15.5)
WBC: 7 10*3/uL (ref 4.0–10.5)

## 2016-07-27 LAB — BASIC METABOLIC PANEL
Anion gap: 4 — ABNORMAL LOW (ref 5–15)
BUN: 19 mg/dL (ref 6–20)
CO2: 27 mmol/L (ref 22–32)
Calcium: 9.5 mg/dL (ref 8.9–10.3)
Chloride: 105 mmol/L (ref 101–111)
Creatinine, Ser: 1.01 mg/dL (ref 0.61–1.24)
GFR calc Af Amer: >60 mL/min (ref >60)
GFR calc non Af Amer: >60 mL/min (ref >60)
Glucose, Bld: 95 mg/dL (ref 65–99)
Potassium: 4.5 mmol/L (ref 3.5–5.1)
Sodium: 136 mmol/L (ref 135–145)

## 2016-07-27 NOTE — Progress Notes (Addendum)
PCP - Dr. Kari Baars Cardiologist - Dr. Wyline Mood    -Dr. Ladona Ridgel manages patient's device; last device check was 02/2016 and next appointment is 08/11/16    -cardiac clearance note - 07/07/16  EKG - 01/26/16 CXR - denies Echo - 02/2015 Stress test - 2014 Cardiac Cath - 10/2014  Patient denies chest pain and shortness of breath at PAT appointment.  Patient informs nurse that he does have sleep apnea and attempts to wear CPAP at night but does not tolerate it.    Per note from Dr. Wyline Mood, Xarelto can be held 2 days prior to surgery.  Patient states that he will take his last dose of Xarelto on 07/30/16.  PT-INR will be drawn day of surgery.    Device form faxed on 07/26/16.  Rep Kerry Fort) notified of surgery date and time.

## 2016-07-27 NOTE — Progress Notes (Signed)
Anesthesia Chart Review:  Pt is a 50 year old male scheduled for L ring finger dupuytren contracture release with joint releases as needed on 08/02/2016 with Dairl Ponder, MD.   - Cardiologist is Dina Rich, MD who has cleared pt for surgery at last office visit 07/07/16 - EP cardiologist is Lewayne Bunting, MD  PMH includes:  Nonischemic cardiomyopathy, AICD, chronic atrial fibrillation, CHF, OSA, post-op N/V. Never smoker. BMI 44  Medications include: lasix, metoprolol, sacubitril-valsartan, xarelto.  Last dose xarelto will be 07/30/16.   Preoperative labs reviewed.  PT/INR will be obtained DOS  EKG 01/26/16: Electronic ventricular pacemaker  Echo 03/13/15:  - Left ventricle: The cavity size was severely dilated. Wall thickness was normal. Systolic function was severely reduced. The estimated ejection fraction was in the range of 20% to 25%. Diffusely hypokinetic. Features are consistent with a pseudonormal left ventricular filling pattern, with concomitant abnormal relaxation and increased filling pressure (grade 2 diastolic dysfunction). Doppler parameters are consistent with high ventricular filling pressure. - Mitral valve: Mildly calcified annulus. There was mild regurgitation. - Left atrium: The atrium was severely dilated. - Right ventricle: The cavity size was mildly dilated. Systolic function was mildly reduced. TAPSE appears overestimated, as there appears to be reduced systolic function. - Right atrium: The atrium was mildly to moderately dilated. - Tricuspid valve: There was mild regurgitation.  Cardiac cath 11/12/14:   Severe nonischemic cardiomyopathy with angiographic normal coronary arteries.  Only mild secondary pulmonary hypertension with mild to moderately elevated LVEDP.  Notable V wave in wedge pressure waveform would suggest possible mitral regurgitation  Relatively preserved cardiac output, but reduced cardiac index based on Fick and thermal dilution.  If no  changes, I anticipate pt can proceed with surgery as scheduled.   Rica Mast, FNP-BC [facility]/Anesthesiology Phone: [phone] 07/27/2016 4:42 PM

## 2016-07-28 ENCOUNTER — Encounter (HOSPITAL_COMMUNITY)
Admission: RE | Admit: 2016-07-28 | Discharge: 2016-07-28 | Disposition: A | Discharge status: Home / Self Care | Payer: 59 | Source: Ambulatory Visit | Attending: Cardiology | Admitting: Cardiology

## 2016-07-28 DIAGNOSIS — I5040 Unspecified combined systolic (congestive) and diastolic (congestive) heart failure: Secondary | ICD-10-CM

## 2016-07-28 DIAGNOSIS — I5042 Chronic combined systolic (congestive) and diastolic (congestive) heart failure: Secondary | ICD-10-CM | POA: Diagnosis not present

## 2016-07-28 NOTE — Progress Notes (Signed)
Daily Session Note  Patient Details  Name: Brandon Torres MRN: [id number] Date of Birth: 23-Aug-1966 Referring Provider:   Flowsheet Row CARDIAC REHAB PHASE II ORIENTATION from 06/17/2016 in [facility]  Referring Provider  Dr. Harl Bowie       Encounter Date: 07/28/2016  Check In:     Session Check In - 07/28/16 0815      Check-In   Location [facility]   Staff Present Russella Dar, MS, EP, [facility], Exercise Physiologist;Debra Wynetta Emery, RN, BSN;Vanderbilt Ranieri, BS, EP, Exercise Physiologist   Supervising physician immediately available to respond to emergencies See telemetry face sheet for immediately available MD   Medication changes reported     No   Fall or balance concerns reported    No   Warm-up and Cool-down Performed as group-led instruction   Resistance Training Performed Yes   VAD Patient? No     Pain Assessment   Currently in Pain? No/denies   Pain Score 0-No pain   Multiple Pain Sites No      Capillary Blood Glucose: Results for orders placed or performed during the hospital encounter of 07/27/16 (from the past 24 hour(s))  CBC     Status: None   Collection Time: 07/27/16  8:45 AM  Result Value Ref Range   WBC 7.0 4.0 - 10.5 K/uL   RBC 5.23 4.22 - 5.81 MIL/uL   Hemoglobin 15.8 13.0 - 17.0 g/dL   HCT 48.0 39.0 - 52.0 %   MCV 91.8 78.0 - 100.0 fL   MCH 30.2 26.0 - 34.0 pg   MCHC 32.9 30.0 - 36.0 g/dL   RDW 12.7 11.5 - 15.5 %   Platelets 157 150 - 400 K/uL  Basic metabolic panel     Status: Abnormal   Collection Time: 07/27/16  8:45 AM  Result Value Ref Range   Sodium 136 135 - 145 mmol/L   Potassium 4.5 3.5 - 5.1 mmol/L   Chloride 105 101 - 111 mmol/L   CO2 27 22 - 32 mmol/L   Glucose, Bld 95 65 - 99 mg/dL   BUN 19 6 - 20 mg/dL   Creatinine, Ser 1.01 0.61 - 1.24 mg/dL   Calcium 9.5 8.9 - 10.3 mg/dL   GFR calc non Af Amer >60 >60 mL/min   GFR calc Af Amer >60 >60 mL/min   Anion gap 4 (L) 5 - 15     Goals Met:   Independence with exercise equipment Exercise tolerated well No report of cardiac concerns or symptoms Strength training completed today  Goals Unmet:  Not Applicable  Comments: Check out 915   Dr. Kate Sable is Medical Director for [facility] and Pulmonary Rehab.

## 2016-07-30 ENCOUNTER — Encounter (HOSPITAL_COMMUNITY)
Admission: RE | Admit: 2016-07-30 | Discharge: 2016-07-30 | Disposition: A | Discharge status: Home / Self Care | Payer: 59 | Source: Ambulatory Visit | Attending: Cardiology | Admitting: Cardiology

## 2016-07-30 DIAGNOSIS — I5040 Unspecified combined systolic (congestive) and diastolic (congestive) heart failure: Secondary | ICD-10-CM

## 2016-07-30 DIAGNOSIS — I5042 Chronic combined systolic (congestive) and diastolic (congestive) heart failure: Secondary | ICD-10-CM | POA: Diagnosis not present

## 2016-07-30 NOTE — Progress Notes (Signed)
Daily Session Note  Patient Details  Name: Brandon Torres MRN: [id number] Date of Birth: 06-30-1966 Referring Provider:   Flowsheet Row CARDIAC REHAB PHASE II ORIENTATION from 06/17/2016 in [facility]  Referring Provider  Dr. Harl Bowie       Encounter Date: 07/30/2016  Check In:     Session Check In - 07/30/16 0815      Check-In   Location [facility]   Staff Present Aundra Dubin, RN, BSN;Natalia Wittmeyer Luther Parody, BS, EP, Exercise Physiologist   Supervising physician immediately available to respond to emergencies See telemetry face sheet for immediately available MD   Medication changes reported     No   Fall or balance concerns reported    No   Warm-up and Cool-down Performed as group-led instruction   Resistance Training Performed Yes   VAD Patient? No     Pain Assessment   Currently in Pain? No/denies   Pain Score 0-No pain   Multiple Pain Sites No      Capillary Blood Glucose: No results found for this or any previous visit (from the past 24 hour(s)).   Goals Met:  Independence with exercise equipment Exercise tolerated well No report of cardiac concerns or symptoms Strength training completed today  Goals Unmet:  Not Applicable  Comments: Check out 915   Dr. Kate Sable is Medical Director for [place] and Pulmonary Rehab.

## 2016-07-30 NOTE — Progress Notes (Signed)
Spoke with Brandon Torres with [employer] who stated he would place patient on book in case a rep is needed. Made aware of surgery 08/02/16.

## 2016-08-01 ENCOUNTER — Encounter (HOSPITAL_COMMUNITY): Payer: Self-pay | Admitting: Anesthesiology

## 2016-08-01 MED ORDER — CEFAZOLIN SODIUM 10 G IJ SOLR
3.0000 g | INTRAMUSCULAR | Status: AC
  Administered 2016-08-02: 3 g via INTRAVENOUS
  Filled 2016-08-01: qty 3000

## 2016-08-01 NOTE — Anesthesia Preprocedure Evaluation (Addendum)
Anesthesia Evaluation  Patient identified by MRN, date of birth, ID band Patient awake    Reviewed: Allergy & Precautions, NPO status , Patient's Chart, lab work & pertinent test results  Airway Mallampati: III       Dental   Pulmonary neg pulmonary ROS,    Pulmonary exam normal        Cardiovascular hypertension, negative cardio ROS Normal cardiovascular exam     Neuro/Psych negative neurological ROS  negative psych ROS   GI/Hepatic negative GI ROS, Neg liver ROS,   Endo/Other  Morbid obesity  Renal/GU negative Renal ROS  negative genitourinary   Musculoskeletal negative musculoskeletal ROS (+)   Abdominal (+) + obese,   Peds negative pediatric ROS (+)  Hematology negative hematology ROS (+)   Anesthesia Other Findings   Reproductive/Obstetrics negative OB ROS                            Anesthesia Physical Anesthesia Plan  ASA: III  Anesthesia Plan: MAC and Regional   Post-op Pain Management:    Induction:   Airway Management Planned: Mask  Additional Equipment:   Intra-op Plan:   Post-operative Plan:   Informed Consent: I have reviewed the patients History and Physical, chart, labs and discussed the procedure including the risks, benefits and alternatives for the proposed anesthesia with the patient or authorized representative who has indicated his/her understanding and acceptance.     Plan Discussed with:   Anesthesia Plan Comments:        Anesthesia Quick Evaluation

## 2016-08-02 ENCOUNTER — Ambulatory Visit (HOSPITAL_COMMUNITY)
Admission: RE | Admit: 2016-08-02 | Discharge: 2016-08-02 | Disposition: A | Discharge status: Home / Self Care | Payer: 59 | Source: Ambulatory Visit | Attending: Orthopedic Surgery | Admitting: Orthopedic Surgery

## 2016-08-02 ENCOUNTER — Ambulatory Visit (HOSPITAL_COMMUNITY): Payer: 59 | Admitting: Anesthesiology

## 2016-08-02 ENCOUNTER — Ambulatory Visit (HOSPITAL_COMMUNITY): Payer: 59 | Admitting: Emergency Medicine

## 2016-08-02 ENCOUNTER — Encounter (HOSPITAL_COMMUNITY): Payer: 59

## 2016-08-02 ENCOUNTER — Encounter (HOSPITAL_COMMUNITY)
Admission: RE | Disposition: A | Discharge status: Home / Self Care | Payer: Self-pay | Source: Ambulatory Visit | Attending: Orthopedic Surgery

## 2016-08-02 ENCOUNTER — Encounter (HOSPITAL_COMMUNITY): Payer: Self-pay | Admitting: *Deleted

## 2016-08-02 DIAGNOSIS — Z7901 Long term (current) use of anticoagulants: Secondary | ICD-10-CM | POA: Insufficient documentation

## 2016-08-02 DIAGNOSIS — I110 Hypertensive heart disease with heart failure: Secondary | ICD-10-CM | POA: Insufficient documentation

## 2016-08-02 DIAGNOSIS — I428 Other cardiomyopathies: Secondary | ICD-10-CM | POA: Diagnosis not present

## 2016-08-02 DIAGNOSIS — Z8042 Family history of malignant neoplasm of prostate: Secondary | ICD-10-CM | POA: Insufficient documentation

## 2016-08-02 DIAGNOSIS — M720 Palmar fascial fibromatosis [Dupuytren]: Secondary | ICD-10-CM | POA: Diagnosis not present

## 2016-08-02 DIAGNOSIS — Z8249 Family history of ischemic heart disease and other diseases of the circulatory system: Secondary | ICD-10-CM | POA: Diagnosis not present

## 2016-08-02 DIAGNOSIS — G4733 Obstructive sleep apnea (adult) (pediatric): Secondary | ICD-10-CM | POA: Diagnosis not present

## 2016-08-02 DIAGNOSIS — Z6841 Body Mass Index (BMI) 40.0 and over, adult: Secondary | ICD-10-CM | POA: Insufficient documentation

## 2016-08-02 DIAGNOSIS — I509 Heart failure, unspecified: Secondary | ICD-10-CM | POA: Diagnosis not present

## 2016-08-02 DIAGNOSIS — I480 Paroxysmal atrial fibrillation: Secondary | ICD-10-CM | POA: Insufficient documentation

## 2016-08-02 DIAGNOSIS — Z95810 Presence of automatic (implantable) cardiac defibrillator: Secondary | ICD-10-CM | POA: Diagnosis not present

## 2016-08-02 HISTORY — PX: DUPUYTREN CONTRACTURE RELEASE: SHX1478

## 2016-08-02 LAB — PROTIME-INR
INR: 1.07
Prothrombin Time: 13.9 seconds (ref 11.4–15.2)

## 2016-08-02 SURGERY — RELEASE, DUPUYTREN CONTRACTURE
Anesthesia: Monitor Anesthesia Care | Site: Hand | Laterality: Left

## 2016-08-02 MED ORDER — ARTIFICIAL TEARS OP OINT
TOPICAL_OINTMENT | OPHTHALMIC | Status: AC
  Filled 2016-08-02: qty 3.5

## 2016-08-02 MED ORDER — BUPIVACAINE HCL (PF) 0.25 % IJ SOLN
INTRAMUSCULAR | Status: DC | PRN
  Administered 2016-08-02: 5 mL

## 2016-08-02 MED ORDER — ONDANSETRON HCL 4 MG/2ML IJ SOLN
INTRAMUSCULAR | Status: DC | PRN
  Administered 2016-08-02: 4 mg via INTRAVENOUS

## 2016-08-02 MED ORDER — MEPERIDINE HCL 25 MG/ML IJ SOLN: 6.2500 mg | INTRAMUSCULAR | Status: DC | PRN

## 2016-08-02 MED ORDER — LIDOCAINE 2% (20 MG/ML) 5 ML SYRINGE
INTRAMUSCULAR | Status: AC
  Filled 2016-08-02: qty 5

## 2016-08-02 MED ORDER — ROCURONIUM BROMIDE 10 MG/ML (PF) SYRINGE
PREFILLED_SYRINGE | INTRAVENOUS | Status: AC
  Filled 2016-08-02: qty 10

## 2016-08-02 MED ORDER — 0.9 % SODIUM CHLORIDE (POUR BTL) OPTIME
TOPICAL | Status: DC | PRN
  Administered 2016-08-02: 1000 mL

## 2016-08-02 MED ORDER — CHLORHEXIDINE GLUCONATE 4 % EX LIQD: 60.0000 mL | Freq: Once | CUTANEOUS | Status: DC

## 2016-08-02 MED ORDER — PROPOFOL 10 MG/ML IV BOLUS
INTRAVENOUS | Status: DC | PRN
  Administered 2016-08-02: 50 mg via INTRAVENOUS

## 2016-08-02 MED ORDER — BUPIVACAINE-EPINEPHRINE (PF) 0.5% -1:200000 IJ SOLN
INTRAMUSCULAR | Status: DC | PRN
  Administered 2016-08-02: 25 mL via PERINEURAL

## 2016-08-02 MED ORDER — ONDANSETRON HCL 4 MG/2ML IJ SOLN: 4.0000 mg | Freq: Once | INTRAMUSCULAR | Status: DC | PRN

## 2016-08-02 MED ORDER — MIDAZOLAM HCL 2 MG/2ML IJ SOLN
INTRAMUSCULAR | Status: AC
  Filled 2016-08-02: qty 2

## 2016-08-02 MED ORDER — BETAMETHASONE SOD PHOS & ACET 6 (3-3) MG/ML IJ SUSP
12.0000 mg | Freq: Once | INTRAMUSCULAR | Status: AC
  Administered 2016-08-02: 6 mg via INTRA_ARTICULAR
  Filled 2016-08-02: qty 2

## 2016-08-02 MED ORDER — EPHEDRINE SULFATE 50 MG/ML IJ SOLN
INTRAMUSCULAR | Status: DC | PRN
  Administered 2016-08-02 (×2): 5 mg via INTRAVENOUS

## 2016-08-02 MED ORDER — FENTANYL CITRATE (PF) 100 MCG/2ML IJ SOLN
INTRAMUSCULAR | Status: AC
  Filled 2016-08-02: qty 2

## 2016-08-02 MED ORDER — PROPOFOL 10 MG/ML IV BOLUS
INTRAVENOUS | Status: AC
Start: 2016-08-02 — End: 2016-08-02
  Filled 2016-08-02: qty 20

## 2016-08-02 MED ORDER — FENTANYL CITRATE (PF) 100 MCG/2ML IJ SOLN
INTRAMUSCULAR | Status: DC | PRN
  Administered 2016-08-02 (×2): 50 ug via INTRAVENOUS
  Administered 2016-08-02: 100 ug via INTRAVENOUS

## 2016-08-02 MED ORDER — LIDOCAINE HCL (CARDIAC) 20 MG/ML IV SOLN
INTRAVENOUS | Status: DC | PRN
  Administered 2016-08-02: 100 mg via INTRATRACHEAL

## 2016-08-02 MED ORDER — MIDAZOLAM HCL 5 MG/5ML IJ SOLN
INTRAMUSCULAR | Status: DC | PRN
  Administered 2016-08-02 (×2): 1 mg via INTRAVENOUS

## 2016-08-02 MED ORDER — FENTANYL CITRATE (PF) 100 MCG/2ML IJ SOLN: 25.0000 ug | INTRAMUSCULAR | Status: DC | PRN

## 2016-08-02 MED ORDER — LACTATED RINGERS IV SOLN
INTRAVENOUS | Status: DC | PRN
  Administered 2016-08-02: 07:00:00 via INTRAVENOUS

## 2016-08-02 MED ORDER — PROPOFOL 500 MG/50ML IV EMUL
INTRAVENOUS | Status: DC | PRN
  Administered 2016-08-02: 50 ug/kg/min via INTRAVENOUS

## 2016-08-02 MED ORDER — BUPIVACAINE HCL (PF) 0.25 % IJ SOLN
INTRAMUSCULAR | Status: AC
  Filled 2016-08-02: qty 30

## 2016-08-02 MED ORDER — OXYCODONE-ACETAMINOPHEN 5-325 MG PO TABS: 1.0000 | ORAL_TABLET | ORAL | 0 refills | Status: DC | PRN

## 2016-08-02 SURGICAL SUPPLY — 48 items
BANDAGE ELASTIC 3 VELCRO ST LF (GAUZE/BANDAGES/DRESSINGS) ×2 IMPLANT
BNDG CMPR 9X4 STRL LF SNTH (GAUZE/BANDAGES/DRESSINGS) ×1
BNDG COHESIVE 1X5 TAN STRL LF (GAUZE/BANDAGES/DRESSINGS) IMPLANT
BNDG ESMARK 4X9 LF (GAUZE/BANDAGES/DRESSINGS) ×2 IMPLANT
BNDG GAUZE ELAST 4 BULKY (GAUZE/BANDAGES/DRESSINGS) ×2 IMPLANT
CORDS BIPOLAR (ELECTRODE) ×2 IMPLANT
COVER SURGICAL LIGHT HANDLE (MISCELLANEOUS) ×2 IMPLANT
CUFF TOURNIQUET SINGLE 18IN (TOURNIQUET CUFF) ×2 IMPLANT
CUFF TOURNIQUET SINGLE 24IN (TOURNIQUET CUFF) IMPLANT
DRAPE SURG 17X23 STRL (DRAPES) ×2 IMPLANT
DRSG KUZMA FLUFF (GAUZE/BANDAGES/DRESSINGS) IMPLANT
DURAPREP 26ML APPLICATOR (WOUND CARE) ×2 IMPLANT
GAUZE SPONGE 2X2 8PLY STRL LF (GAUZE/BANDAGES/DRESSINGS) IMPLANT
GAUZE SPONGE 4X4 12PLY STRL (GAUZE/BANDAGES/DRESSINGS) IMPLANT
GAUZE SPONGE 4X4 16PLY XRAY LF (GAUZE/BANDAGES/DRESSINGS) ×2 IMPLANT
GAUZE XEROFORM 1X8 LF (GAUZE/BANDAGES/DRESSINGS) ×2 IMPLANT
GLOVE SURG SYN 8.0 (GLOVE) ×2 IMPLANT
GOWN STRL REUS W/ TWL LRG LVL3 (GOWN DISPOSABLE) ×1 IMPLANT
GOWN STRL REUS W/ TWL XL LVL3 (GOWN DISPOSABLE) ×1 IMPLANT
GOWN STRL REUS W/TWL LRG LVL3 (GOWN DISPOSABLE) ×2
GOWN STRL REUS W/TWL XL LVL3 (GOWN DISPOSABLE) ×2
KIT BASIN OR (CUSTOM PROCEDURE TRAY) ×2 IMPLANT
KIT ROOM TURNOVER OR (KITS) ×2 IMPLANT
MANIFOLD NEPTUNE II (INSTRUMENTS) ×2 IMPLANT
NEEDLE HYPO 25GX1X1/2 BEV (NEEDLE) IMPLANT
NS IRRIG 1000ML POUR BTL (IV SOLUTION) ×2 IMPLANT
PACK ORTHO EXTREMITY (CUSTOM PROCEDURE TRAY) ×2 IMPLANT
PAD ARMBOARD 7.5X6 YLW CONV (MISCELLANEOUS) ×4 IMPLANT
PAD CAST 3X4 CTTN HI CHSV (CAST SUPPLIES) ×1 IMPLANT
PADDING CAST ABS 3INX4YD NS (CAST SUPPLIES) ×1
PADDING CAST ABS COTTON 3X4 (CAST SUPPLIES) ×1 IMPLANT
PADDING CAST COTTON 3X4 STRL (CAST SUPPLIES) ×2
SPLINT PLASTER EXTRA FAST 3X15 (CAST SUPPLIES) ×1
SPLINT PLASTER GYPS XFAST 3X15 (CAST SUPPLIES) ×1 IMPLANT
SPONGE GAUZE 2X2 STER 10/PKG (GAUZE/BANDAGES/DRESSINGS)
STRIP CLOSURE SKIN 1/2X4 (GAUZE/BANDAGES/DRESSINGS) IMPLANT
SUCTION FRAZIER HANDLE 10FR (MISCELLANEOUS)
SUCTION TUBE FRAZIER 10FR DISP (MISCELLANEOUS) IMPLANT
SUT ETHILON 4 0 PS 2 18 (SUTURE) ×4 IMPLANT
SUT ETHILON 5 0 PS 2 18 (SUTURE) IMPLANT
SUT PROLENE 3 0 PS 2 (SUTURE) IMPLANT
SYR CONTROL 10ML LL (SYRINGE) ×2 IMPLANT
TOWEL OR 17X24 6PK STRL BLUE (TOWEL DISPOSABLE) ×2 IMPLANT
TOWEL OR 17X26 10 PK STRL BLUE (TOWEL DISPOSABLE) ×2 IMPLANT
TUBE CONNECTING 12X1/4 (SUCTIONS) IMPLANT
TUBE FEEDING 5FR 15 INCH (TUBING) ×2 IMPLANT
UNDERPAD 30X30 (UNDERPADS AND DIAPERS) ×2 IMPLANT
WATER STERILE IRR 1000ML POUR (IV SOLUTION) ×2 IMPLANT

## 2016-08-02 NOTE — Addendum Note (Signed)
Addendum  created 08/02/16 1236 by Leilani Able, MD   Anesthesia Intra Blocks edited, Anesthesia Intra Meds edited, Sign clinical note

## 2016-08-02 NOTE — Anesthesia Procedure Notes (Signed)
Procedure Name: MAC Date/Time: 08/02/2016 7:35 AM Performed by: Wray Kearns A Pre-anesthesia Checklist: Patient identified, Emergency Drugs available, Suction available and Patient being monitored Patient Re-evaluated:Patient Re-evaluated prior to inductionOxygen Delivery Method: Nasal cannula Intubation Type: IV induction Placement Confirmation: positive ETCO2

## 2016-08-02 NOTE — Op Note (Signed)
NAME:  Brandon Torres, Brandon Torres              ACCOUNT NO.:  [id number]  MEDICAL RECORD NO.:  [id number]  LOCATION:  [facility]                         FACILITY:  [facility]  PHYSICIAN:  Artist Pais. Cortni Tays, M.D.DATE OF BIRTH:  [birth year]  DATE OF PROCEDURE:  08/02/2016 DATE OF DISCHARGE:                              OPERATIVE REPORT   PREOPERATIVE DIAGNOSIS:  Left hand and left ring finger Dupuytren contracture involving metacarpophalangeal and proximal interphalangeal joints.  POSTOPERATIVE DIAGNOSIS:  Left hand and left ring finger Dupuytren contracture involving metacarpophalangeal and proximal interphalangeal joints.  PROCEDURE:  Excision of Dupuytren contracture involving palmar aspect of left hand with release of metacarpophalangeal joint, manipulation of finger joint at metacarpophalangeal joint level.  SURGEON:  Artist Pais. Mina Marble, MD.  ASSISTANT:  None.  ANESTHESIA:  Block and IV sedation.  SPECIMEN:  One specimen sent.  DESCRIPTION OF PROCEDURE:  The patient was to taken the operating suite after induction of adequate supraclavicular block analgesia and then IV sedation.  The left upper extremity was prepped and draped in sterile fashion.  An Esmarch was used to exsanguinate the limb.  Tourniquet was inflated to 275 mmHg.  At this point in time, a Brunner incision was made starting at [facility] cardinal line and going to the level of the proximal interphalangeal joint flexion crease on the left ring finger. Flaps were raised accordingly.  Dissection was carried down to the intersection between the diseased and normal palmar fascia proximally. This was transected.  We then carefully identified the neurovascular bundles both on the radial and ulnar side of the diseased cord and traced it to the level of the proximal phalanx.  They were in their normal anatomic position.  We were carefully dissected from proximal to distal and removed the disease cord off the flexor sheath.  We  then gently manipulated metacarpal phalangeal joint into full extension and the proximal interphalangeal joint had minimal to no involvement.  The wound was thoroughly irrigated.  Hemostasis was achieved with bipolar cautery.  We then loosely closed this incision over #5 pediatric feeding tube.  We closed with 4-0 nylon.  We dressed with Xeroform, 4x4s, fluffs, and a volar splint.  Prior to application of the splint, we injected 1 mL of Celestone and 5 mL of Marcaine 0.25% plain into the #5 pediatric feeding tube which was then removed.  The patient then was taken to recovery room in stable fashion.  The patient tolerated this procedure well. Again, went to recovery room stable.     Artist Pais Mina Marble, M.D.     MAW/MEDQ  D:  08/02/2016  T:  08/02/2016  Job:  [id number]

## 2016-08-02 NOTE — Anesthesia Procedure Notes (Addendum)
Anesthesia Regional Block:  Supraclavicular block  Pre-Anesthetic Checklist: ,, timeout performed, Correct Patient, Correct Site, Correct Laterality, Correct Procedure, Correct Position, site marked, Risks and benefits discussed, Surgical consent,  Pre-op evaluation,  At surgeon's request  Laterality: Left  Prep: Maximum Sterile Barrier Precautions used, chloraprep       Needles:  Injection technique: Single-shot  Needle Type: Echogenic Stimulator Needle     Needle Length: 9cm 9 cm Needle Gauge: 20 and 20 G    Additional Needles:  Procedures: ultrasound guided (picture in chart) Supraclavicular block Narrative:  Start time: 08/02/2016 7:15 AM End time: 08/02/2016 7:20 AM Injection made incrementally with aspirations every 5 mL.  Performed by: Personally  Anesthesiologist: Leilani Able  Additional Notes: The patient tolerated the procedure well. There were no complications. 25cc of 0.5 % marcaine with epi 1:200,000.injected incrementally with - aspiration.

## 2016-08-02 NOTE — Op Note (Signed)
See note [phone]

## 2016-08-02 NOTE — Progress Notes (Signed)
Report given to lauren reynolds rn as caregiver 

## 2016-08-02 NOTE — Anesthesia Postprocedure Evaluation (Signed)
Anesthesia Post Note  Patient: ASAHD LEISINGER  Procedure(s) Performed: Procedure(s) (LRB): Excision of left hand DUPUYTREN CONTRACTURE RELEASE ring finger with joint releases as needed (Left)  Patient location during evaluation: PACU Anesthesia Type: MAC Level of consciousness: awake Pain management: pain level controlled Vital Signs Assessment: post-procedure vital signs reviewed and stable Respiratory status: spontaneous breathing Cardiovascular status: stable Postop Assessment: no signs of nausea or vomiting Anesthetic complications: no     Last Vitals:  Vitals:   08/02/16 0900 08/02/16 0915  BP: 91/65 94/65  Pulse: (!) 59 (!) 59  Resp: 13 17  Temp:      Last Pain:  Vitals:   08/02/16 0840  TempSrc:   PainSc: 0-No pain   Pain Goal: Patients Stated Pain Goal: 4 (08/02/16 0556)               Rollin Kotowski JR,JOHN Susann Givens

## 2016-08-02 NOTE — Progress Notes (Signed)
Orthopedic Tech Progress Note Patient Details:  Brandon Torres October 19, 1966 [id number]  Patient ID: Brandon Torres, male   DOB: September 09, 1966, 50 y.o.   MRN: [id number]   [name] 08/02/2016, 9:56 AM Viewed order from RN order list

## 2016-08-02 NOTE — Transfer of Care (Signed)
Immediate Anesthesia Transfer of Care Note  Patient: Brandon Torres  Procedure(s) Performed: Procedure(s) with comments: Excision of left hand DUPUYTREN CONTRACTURE RELEASE ring finger with joint releases as needed (Left) - Axillary block  Patient Location: PACU  Anesthesia Type:MAC combined with regional for post-op pain  Level of Consciousness: awake, alert , oriented, patient cooperative and responds to stimulation  Airway & Oxygen Therapy: Patient Spontanous Breathing and Patient connected to nasal cannula oxygen  Post-op Assessment: Report given to RN and Post -op Vital signs reviewed and stable  Post vital signs: Reviewed and stable  Last Vitals:  Vitals:   08/02/16 0625  BP: 95/64  Pulse: (!) 59  Resp: 20  Temp: 36.7 C    Last Pain:  Vitals:   08/02/16 0625  TempSrc: Oral      Patients Stated Pain Goal: 4 (08/02/16 0556)  Complications: No apparent anesthesia complications
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
## 2016-08-02 NOTE — Progress Notes (Signed)
Orthopedic Tech Progress Note Patient Details:  Brandon Torres 1966/06/14 [id number]  Ortho Devices Type of Ortho Device: Arm sling Ortho Device/Splint Location: lue Ortho Device/Splint Interventions: Application   Derenda Giddings 08/02/2016, 9:56 AM

## 2016-08-03 ENCOUNTER — Encounter (HOSPITAL_COMMUNITY): Payer: Self-pay | Admitting: Orthopedic Surgery

## 2016-08-04 ENCOUNTER — Encounter (HOSPITAL_COMMUNITY): Payer: 59

## 2016-08-04 ENCOUNTER — Encounter (HOSPITAL_COMMUNITY)
Admission: RE | Admit: 2016-08-04 | Discharge: 2016-08-04 | Disposition: A | Discharge status: Home / Self Care | Payer: 59 | Source: Ambulatory Visit | Attending: Cardiology | Admitting: Cardiology

## 2016-08-04 DIAGNOSIS — I5042 Chronic combined systolic (congestive) and diastolic (congestive) heart failure: Secondary | ICD-10-CM | POA: Diagnosis not present

## 2016-08-04 DIAGNOSIS — I5040 Unspecified combined systolic (congestive) and diastolic (congestive) heart failure: Secondary | ICD-10-CM

## 2016-08-04 NOTE — Progress Notes (Signed)
Cardiac Individual Treatment Plan  Patient Details  Name: Brandon Torres MRN: [id number] Date of Birth: November 03, 1965 Referring Provider:   Flowsheet Row CARDIAC REHAB PHASE II ORIENTATION from 06/17/2016 in [facility] CARDIAC REHABILITATION  Referring Provider  Dr. Wyline Mood       Initial Encounter Date:  Flowsheet Row CARDIAC REHAB PHASE II ORIENTATION from 06/17/2016 in [place] CARDIAC REHABILITATION  Date  06/17/16  Referring Provider  Dr. Wyline Mood       Visit Diagnosis: Combined systolic and diastolic congestive heart failure, NYHA class 2, unspecified congestive heart failure chronicity (HCC)  Patient's Home Medications on Admission:  Current Outpatient Prescriptions:  .  acetaminophen (TYLENOL) 500 MG tablet, Take 500 mg by mouth every 6 (six) hours as needed for mild pain or headache. , Disp: , Rfl:  .  furosemide (LASIX) 40 MG tablet, Take 1 tab daily as needed for swelling (Patient taking differently: Take 40 mg by mouth daily as needed (for fluid retention.). ), Disp: 90 tablet, Rfl: 3 .  metoprolol succinate (TOPROL XL) 100 MG 24 hr tablet, Take 1 tablet (100 mg total) by mouth 2 (two) times daily. Take with or immediately following a meal., Disp: 180 tablet, Rfl: 3 .  oxyCODONE-acetaminophen (ROXICET) 5-325 MG tablet, Take 1 tablet by mouth every 4 (four) hours as needed for severe pain., Disp: 30 tablet, Rfl: 0 .  sacubitril-valsartan (ENTRESTO) 49-51 MG, Take 1 tablet by mouth 2 (two) times daily., Disp: 180 tablet, Rfl: 3 .  XARELTO 20 MG TABS tablet, TAKE ONE TABLET BY MOUTH EVERY DAY WITH DINNER., Disp: 30 tablet, Rfl: 6  Past Medical History: Past Medical History:  Diagnosis Date  . AICD (automatic cardioverter/defibrillator) present   . CHF (congestive heart failure) (HCC)   . Chronic anticoagulation   . Dysrhythmia   . Morbid obesity (HCC)   . Non-ischemic cardiomyopathy (HCC)   . OSA on CPAP    "have mask; can't tolerate mask" (04/23/2015)  . PAF (paroxysmal  atrial fibrillation) (HCC)    RFA at [facility]+ dofetilide  . PONV (postoperative nausea and vomiting)    nausea  . Wears glasses     Tobacco Use: History  Smoking Status  . Never Smoker  Smokeless Tobacco  . Never Used    Labs: Recent Review Flowsheet Data    Labs for ITP Cardiac and Pulmonary Rehab Latest Ref Rng & Units 03/05/2008 01/04/2012 11/12/2014 11/12/2014 10/06/2015   Cholestrol 0 - 200 mg/dL 147 829 - - 562   LDLCALC 0 - 99 mg/dL 81 130(Q) - - 657(Q)   HDL >40 mg/dL 50 46 - - 48   Trlycerides <150 mg/dL 94 66 - - 81   Hemoglobin A1c 4.8 - 5.6 % - - - - 5.4   PHART 7.350 - 7.450 - - - 7.361 -   PCO2ART 35.0 - 45.0 mmHg - - - 40.6 -   HCO3 20.0 - 24.0 mEq/L - - 23.6 23.0 -   TCO2 0 - 100 mmol/L - - 25 24 -   ACIDBASEDEF 0.0 - 2.0 mmol/L - - 2.0 2.0 -   O2SAT % - - 69.0 98.0 -      Capillary Blood Glucose: No results found for: GLUCAP   Exercise Target Goals:    Exercise Program Goal: Individual exercise prescription set with THRR, safety & activity barriers. Participant demonstrates ability to understand and report RPE using BORG scale, to self-measure pulse accurately, and to acknowledge the importance of the exercise prescription.  Exercise  Prescription Goal: Starting with aerobic activity 30 plus minutes a day, 3 days per week for initial exercise prescription. Provide home exercise prescription and guidelines that participant acknowledges understanding prior to discharge.  Activity Barriers & Risk Stratification:     Activity Barriers & Cardiac Risk Stratification - 06/17/16 1601      Activity Barriers & Cardiac Risk Stratification   Activity Barriers None   Cardiac Risk Stratification High      6 Minute Walk:     6 Minute Walk    Row Name 06/17/16 1543         6 Minute Walk   Phase Initial     Distance 1500 feet     Walk Time 6 minutes     # of Rest Breaks 0     MPH 2.84     METS 3.17     RPE 12     Perceived Dyspnea  13     VO2 Peak 13.11      Symptoms No     Resting HR 61 bpm     Resting BP 110/64     Max Ex. HR 137 bpm     Max Ex. BP 126/76     2 Minute Post BP 108/72        Initial Exercise Prescription:     Initial Exercise Prescription - 06/17/16 1500      Date of Initial Exercise RX and Referring Provider   Date 06/17/16   Referring Provider Dr. Wyline MoodBranch      Treadmill   MPH 1.3   Grade 0   Minutes 15   METs 1.9     NuStep   Level 2   Watts 15   Minutes 20   METs 1.9     Prescription Details   Frequency (times per week) 3   Duration Progress to 30 minutes of continuous aerobic without signs/symptoms of physical distress     Intensity   THRR REST +  30   THRR 40-80% of Max Heartrate 105-128-150   Ratings of Perceived Exertion 11-13   Perceived Dyspnea 0-4     Progression   Progression Continue progressive overload as per policy without signs/symptoms or physical distress.     Resistance Training   Training Prescription Yes   Weight 1   Reps 10-12      Perform Capillary Blood Glucose checks as needed.  Exercise Prescription Changes:     Exercise Prescription Changes    Row Name 07/08/16 0800 08/03/16 0800           Exercise Review   Progression Yes Yes        Response to Exercise   Blood Pressure (Admit) 106/62 98/64      Blood Pressure (Exercise) 142/78 128/80      Blood Pressure (Exit) 106/72 104/72      Heart Rate (Admit) 60 bpm 83 bpm      Heart Rate (Exercise) 100 bpm 129 bpm      Heart Rate (Exit) 67 bpm 83 bpm      Rating of Perceived Exertion (Exercise) 9 9      Duration Progress to 30 minutes of continuous aerobic without signs/symptoms of physical distress Progress to 30 minutes of continuous aerobic without signs/symptoms of physical distress      Intensity Rest + 30 Rest + 30        Progression   Progression Continue progressive overload as per policy without signs/symptoms or physical distress. Continue progressive overload  as per policy without signs/symptoms  or physical distress.        Resistance Training   Training Prescription Yes Yes      Weight 2 4      Reps 10-12 10-12        Treadmill   MPH 1.8 2.4      Grade 0 0      Minutes 15 15      METs 2.3 2.8        NuStep   Level 2 3      Watts 26 61      Minutes 20 20      METs 3.69 3.82        Home Exercise Plan   Plans to continue exercise at Home Home      Frequency Add 2 additional days to program exercise sessions. Add 2 additional days to program exercise sessions.         Exercise Comments:     Exercise Comments    Row Name 07/08/16 4098 08/03/16 0820         Exercise Comments Patient is proggressing appropriately Patient is progressing well           Discharge Exercise Prescription (Final Exercise Prescription Changes):     Exercise Prescription Changes - 08/03/16 0800      Exercise Review   Progression Yes     Response to Exercise   Blood Pressure (Admit) 98/64   Blood Pressure (Exercise) 128/80   Blood Pressure (Exit) 104/72   Heart Rate (Admit) 83 bpm   Heart Rate (Exercise) 129 bpm   Heart Rate (Exit) 83 bpm   Rating of Perceived Exertion (Exercise) 9   Duration Progress to 30 minutes of continuous aerobic without signs/symptoms of physical distress   Intensity Rest + 30     Progression   Progression Continue progressive overload as per policy without signs/symptoms or physical distress.     Resistance Training   Training Prescription Yes   Weight 4   Reps 10-12     Treadmill   MPH 2.4   Grade 0   Minutes 15   METs 2.8     NuStep   Level 3   Watts 61   Minutes 20   METs 3.82     Home Exercise Plan   Plans to continue exercise at Home   Frequency Add 2 additional days to program exercise sessions.      Nutrition:  Target Goals: Understanding of nutrition guidelines, daily intake of sodium 1500mg , cholesterol 200mg , calories 30% from fat and 7% or less from saturated fats, daily to have 5 or more servings of fruits and  vegetables.  Biometrics:     Pre Biometrics - 06/17/16 1542      Pre Biometrics   Height 6' (1.829 m)   Waist Circumference 50.5 inches   Hip Circumference 51 inches   Waist to Hip Ratio 0.99 %   BMI (Calculated) 43.9   Triceps Skinfold 18 mm   % Body Fat 38 %   Grip Strength 129.7 kg   Flexibility 0 in   Single Leg Stand 37 seconds       Nutrition Therapy Plan and Nutrition Goals:   Nutrition Discharge: Rate Your Plate Scores:     Nutrition Assessments - 06/17/16 1607      MEDFICTS Scores   Pre Score 48      Nutrition Goals Re-Evaluation:   Psychosocial: Target Goals: Acknowledge presence or absence of depression,  maximize coping skills, provide positive support system. Participant is able to verbalize types and ability to use techniques and skills needed for reducing stress and depression.  Initial Review & Psychosocial Screening:     Initial Psych Review & Screening - 06/17/16 1615      Initial Review   Current issues with --  Patient does not have any psychosocial issues or concerns.      Family Dynamics   Good Support System? Yes     Barriers   Psychosocial barriers to participate in program There are no identifiable barriers or psychosocial needs.     Screening Interventions   Interventions Encouraged to exercise      Quality of Life Scores:     Quality of Life - 06/17/16 1546      Quality of Life Scores   Health/Function Pre 17.83 %   Socioeconomic Pre 20.93 %   Psych/Spiritual Pre 21.14 %   Family Pre 23.75 %   GLOBAL Pre 19.91 %      PHQ-9: Recent Review Flowsheet Data    Depression screen [facility] 2/9 06/17/2016   Decreased Interest 0   Down, Depressed, Hopeless 0   PHQ - 2 Score 0   Altered sleeping 0   Tired, decreased energy 1   Change in appetite 0   Feeling bad or failure about yourself  0   Trouble concentrating 0   Moving slowly or fidgety/restless 0   Suicidal thoughts 0   PHQ-9 Score 1   Difficult doing work/chores  Not difficult at all      Psychosocial Evaluation and Intervention:     Psychosocial Evaluation - 06/17/16 1617      Psychosocial Evaluation & Interventions   Interventions Encouraged to exercise with the program and follow exercise prescription   Continued Psychosocial Services Needed No      Psychosocial Re-Evaluation:     Psychosocial Re-Evaluation    Row Name 07/09/16 1449 08/04/16 1517           Psychosocial Re-Evaluation   Interventions Encouraged to attend Cardiac Rehabilitation for the exercise Encouraged to attend Cardiac Rehabilitation for the exercise      Comments Patient's QOL score was 19.91 and his PHQ-9 score was 1. He says he is not depressed and does not need counseling.  Patient's QOL score was 19.91 and his PHQ-9 score was 1. He says he is not depressed and does not need counseling.       Continued Psychosocial Services Needed  - No         Vocational Rehabilitation: Provide vocational rehab assistance to qualifying candidates.   Vocational Rehab Evaluation & Intervention:     Vocational Rehab - 06/17/16 1607      Initial Vocational Rehab Evaluation & Intervention   Assessment shows need for Vocational Rehabilitation No      Education: Education Goals: Education classes will be provided on a weekly basis, covering required topics. Participant will state understanding/return demonstration of topics presented.  Learning Barriers/Preferences:     Learning Barriers/Preferences - 06/17/16 1606      Learning Barriers/Preferences   Learning Barriers None   Learning Preferences Skilled Demonstration      Education Topics: Hypertension, Hypertension Reduction -Define heart disease and high blood pressure. Discus how high blood pressure affects the body and ways to reduce high blood pressure.   Exercise and Your Heart -Discuss why it is important to exercise, the FITT principles of exercise, normal and abnormal responses to exercise, and how  to exercise safely.   Angina -Discuss definition of angina, causes of angina, treatment of angina, and how to decrease risk of having angina.   Cardiac Medications -Review what the following cardiac medications are used for, how they affect the body, and side effects that may occur when taking the medications.  Medications include Aspirin, Beta blockers, calcium channel blockers, ACE Inhibitors, angiotensin receptor blockers, diuretics, digoxin, and antihyperlipidemics.   Congestive Heart Failure -Discuss the definition of CHF, how to live with CHF, the signs and symptoms of CHF, and how keep track of weight and sodium intake.   Heart Disease and Intimacy -Discus the effect sexual activity has on the heart, how changes occur during intimacy as we age, and safety during sexual activity.   Smoking Cessation / COPD -Discuss different methods to quit smoking, the health benefits of quitting smoking, and the definition of COPD. Flowsheet Row CARDIAC REHAB PHASE II EXERCISE from 07/28/2016 in [place] CARDIAC REHABILITATION  Date  07/07/16  Educator  DJ  Instruction Review Code  2- meets goals/outcomes      Nutrition I: Fats -Discuss the types of cholesterol, what cholesterol does to the heart, and how cholesterol levels can be controlled. Flowsheet Row CARDIAC REHAB PHASE II EXERCISE from 07/28/2016 in [place] CARDIAC REHABILITATION  Date  07/14/16  Educator  Hart Rochester  Instruction Review Code  2- meets goals/outcomes      Nutrition II: Labels -Discuss the different components of food labels and how to read food label Flowsheet Row CARDIAC REHAB PHASE II EXERCISE from 07/28/2016 in [place] CARDIAC REHABILITATION  Date  07/21/16  Educator  Hart Rochester  Instruction Review Code  2- meets goals/outcomes      Heart Parts and Heart Disease -Discuss the anatomy of the heart, the pathway of blood circulation through the heart, and these are affected by heart disease. Flowsheet  Row CARDIAC REHAB PHASE II EXERCISE from 07/28/2016 in [place] CARDIAC REHABILITATION  Date  07/28/16  Educator  DC  Instruction Review Code  2- meets goals/outcomes      Stress I: Signs and Symptoms -Discuss the causes of stress, how stress may lead to anxiety and depression, and ways to limit stress.   Stress II: Relaxation -Discuss different types of relaxation techniques to limit stress.   Warning Signs of Stroke / TIA -Discuss definition of a stroke, what the signs and symptoms are of a stroke, and how to identify when someone is having stroke.   Knowledge Questionnaire Score:     Knowledge Questionnaire Score - 06/17/16 1606      Knowledge Questionnaire Score   Pre Score 22/24      Core Components/Risk Factors/Patient Goals at Admission:     Personal Goals and Risk Factors at Admission - 06/17/16 1608      Core Components/Risk Factors/Patient Goals on Admission    Weight Management Yes   Intervention Weight Management: Develop a combined nutrition and exercise program designed to reach desired caloric intake, while maintaining appropriate intake of nutrient and fiber, sodium and fats, and appropriate energy expenditure required for the weight goal.;Weight Management/Obesity: Establish reasonable short term and long term weight goals.   Admit Weight 323 lb (146.5 kg)   Goal Weight: Short Term 318 lb (144.2 kg)   Goal Weight: Long Term 303 lb (137.4 kg)   Expected Outcomes Short Term: Continue to assess and modify interventions until short term weight is achieved;Long Term: Adherence to nutrition and physical activity/exercise program aimed toward attainment  of established weight goal   Sedentary Yes   Intervention Provide advice, education, support and counseling about physical activity/exercise needs.;Develop an individualized exercise prescription for aerobic and resistive training based on initial evaluation findings, risk stratification, comorbidities and  participant's personal goals.   Expected Outcomes Achievement of increased cardiorespiratory fitness and enhanced flexibility, muscular endurance and strength shown through measurements of functional capacity and personal statement of participant.   Increase Strength and Stamina Yes   Intervention Provide advice, education, support and counseling about physical activity/exercise needs.;Develop an individualized exercise prescription for aerobic and resistive training based on initial evaluation findings, risk stratification, comorbidities and participant's personal goals.   Expected Outcomes Achievement of increased cardiorespiratory fitness and enhanced flexibility, muscular endurance and strength shown through measurements of functional capacity and personal statement of participant.   Heart Failure Yes   Intervention Provide a combined exercise and nutrition program that is supplemented with education, support and counseling about heart failure. Directed toward relieving symptoms such as shortness of breath, decreased exercise tolerance, and extremity edema.   Expected Outcomes Improve functional capacity of life;Short term: Daily weights obtained and reported for increase. Utilizing diuretic protocols set by physician.;Long term: Adoption of self-care skills and reduction of barriers for early signs and symptoms recognition and intervention leading to self-care maintenance.   Personal Goal Other Yes   Personal Goal Get heart built up again and lose 20 lbs.    Intervention Patient will exercise 3 days/week in program and supplement with 2 days/week of excerise at home.    Expected Outcomes Patient will reach the above stated goals.       Core Components/Risk Factors/Patient Goals Review:      Goals and Risk Factor Review    Row Name 06/17/16 1615 07/09/16 1446 08/04/16 1515         Core Components/Risk Factors/Patient Goals Review   Personal Goals Review Weight Management/Obesity;Increase  Strength and Stamina;Heart Failure Weight Management/Obesity;Increase Strength and Stamina;Other  Get heart built up again.  Weight Management/Obesity;Increase Strength and Stamina;Other  Get heart built up again.     Review  - Patient has had 4 sessions. He says it is too soon to tell if program has improved his strength. Wil continue to progress. After 14 sessions, patient has lost 0.5 lbs. His strength and stamina are improving. He is progressing well in the program.      Expected Outcomes  - Patient will continue to attend sessions improving his strength and stamina and meet his weight loss goal of 20 lbs.  Patient will continue to attend sessions and complete the program improving his strength and stamina and lose weight.         Core Components/Risk Factors/Patient Goals at Discharge (Final Review):      Goals and Risk Factor Review - 08/04/16 1515      Core Components/Risk Factors/Patient Goals Review   Personal Goals Review Weight Management/Obesity;Increase Strength and Stamina;Other  Get heart built up again.   Review After 14 sessions, patient has lost 0.5 lbs. His strength and stamina are improving. He is progressing well in the program.    Expected Outcomes Patient will continue to attend sessions and complete the program improving his strength and stamina and lose weight.       ITP Comments:   Comments: 30 Day Review: Patient doing well with program. Will continue to monitor for progress.

## 2016-08-04 NOTE — Progress Notes (Signed)
Daily Session Note  Patient Details  Name: Brandon Torres MRN: [id number] Date of Birth: Aug 07, 1966 Referring Provider:   Flowsheet Row CARDIAC REHAB PHASE II ORIENTATION from 06/17/2016 in [place]  Referring Provider  Dr. Harl Bowie       Encounter Date: 08/04/2016  Check In:     Session Check In - 08/04/16 0815      Check-In   Location [facility]   Staff Present Aundra Dubin, RN, BSN;Koya Hunger Luther Parody, BS, EP, Exercise Physiologist;Diane Coad, MS, EP, [facility], Exercise Physiologist   Supervising physician immediately available to respond to emergencies See telemetry face sheet for immediately available MD   Medication changes reported     No   Fall or balance concerns reported    No   Warm-up and Cool-down Performed as group-led instruction   Resistance Training Performed Yes   VAD Patient? No     Pain Assessment   Currently in Pain? No/denies   Pain Score 0-No pain   Multiple Pain Sites No      Capillary Blood Glucose: No results found for this or any previous visit (from the past 24 hour(s)).   Goals Met:  Independence with exercise equipment Exercise tolerated well No report of cardiac concerns or symptoms Strength training completed today  Goals Unmet:  Not Applicable  Comments: Check out 915   Dr. Kate Sable is Medical Director for [place] and Pulmonary Rehab.

## 2016-08-06 ENCOUNTER — Encounter (HOSPITAL_COMMUNITY)
Admission: RE | Admit: 2016-08-06 | Discharge: 2016-08-06 | Disposition: A | Discharge status: Home / Self Care | Payer: 59 | Source: Ambulatory Visit | Attending: Cardiology | Admitting: Cardiology

## 2016-08-06 DIAGNOSIS — I5040 Unspecified combined systolic (congestive) and diastolic (congestive) heart failure: Secondary | ICD-10-CM

## 2016-08-06 DIAGNOSIS — I5042 Chronic combined systolic (congestive) and diastolic (congestive) heart failure: Secondary | ICD-10-CM | POA: Diagnosis not present

## 2016-08-06 NOTE — Progress Notes (Signed)
Daily Session Note  Patient Details  Name: RODEL GLASPY MRN: [id number] Date of Birth: [birth year] Referring Provider:   Flowsheet Row CARDIAC REHAB PHASE II ORIENTATION from 06/17/2016 in [place]  Referring Provider  Dr. Harl Bowie       Encounter Date: 08/06/2016  Check In:     Session Check In - 08/06/16 0815      Check-In   Location [facility]   Staff Present Russella Dar, MS, EP, [facility], Exercise Physiologist;Shaquill Iseman Wynetta Emery, RN, BSN   Supervising physician immediately available to respond to emergencies See telemetry face sheet for immediately available MD   Medication changes reported     No   Fall or balance concerns reported    No   Warm-up and Cool-down Performed as group-led instruction   Resistance Training Performed Yes   VAD Patient? No     Pain Assessment   Currently in Pain? No/denies   Pain Score 0-No pain   Multiple Pain Sites No      Capillary Blood Glucose: No results found for this or any previous visit (from the past 24 hour(s)).   Goals Met:  Independence with exercise equipment Exercise tolerated well No report of cardiac concerns or symptoms Strength training completed today  Goals Unmet:  Not Applicable  Comments: Check out 915.   Dr. Kate Sable is Medical Director for [facility] Cardiac and Pulmonary Rehab.

## 2016-08-09 ENCOUNTER — Encounter (HOSPITAL_COMMUNITY)
Admission: RE | Admit: 2016-08-09 | Discharge: 2016-08-09 | Disposition: A | Discharge status: Home / Self Care | Payer: 59 | Source: Ambulatory Visit | Attending: Cardiology | Admitting: Cardiology

## 2016-08-09 DIAGNOSIS — I5042 Chronic combined systolic (congestive) and diastolic (congestive) heart failure: Secondary | ICD-10-CM | POA: Diagnosis not present

## 2016-08-09 DIAGNOSIS — I5040 Unspecified combined systolic (congestive) and diastolic (congestive) heart failure: Secondary | ICD-10-CM

## 2016-08-09 NOTE — Progress Notes (Signed)
Daily Session Note  Patient Details  Name: Brandon Torres MRN: [id number] Date of Birth: March 27, 1966 Referring Provider:   Flowsheet Row CARDIAC REHAB PHASE II ORIENTATION from 06/17/2016 in [place]  Referring Provider  Dr. Harl Bowie       Encounter Date: 08/09/2016  Check In:     Session Check In - 08/09/16 0815      Check-In   Location [facility]   Staff Present Russella Dar, MS, EP, [facility], Exercise Physiologist;Geno Sydnor Wynetta Emery, RN, BSN   Supervising physician immediately available to respond to emergencies See telemetry face sheet for immediately available MD   Medication changes reported     No   Fall or balance concerns reported    No   Warm-up and Cool-down Performed as group-led instruction   Resistance Training Performed Yes   VAD Patient? No     Pain Assessment   Currently in Pain? No/denies   Pain Score 0-No pain   Multiple Pain Sites No      Capillary Blood Glucose: No results found for this or any previous visit (from the past 24 hour(s)).   Goals Met:  Independence with exercise equipment Exercise tolerated well No report of cardiac concerns or symptoms Strength training completed today  Goals Unmet:  Not Applicable  Comments: Check out 915.   Dr. Kate Sable is Medical Director for [facility] Cardiac and Pulmonary Rehab.

## 2016-08-11 ENCOUNTER — Encounter: Payer: Self-pay | Admitting: Internal Medicine

## 2016-08-11 ENCOUNTER — Encounter (HOSPITAL_COMMUNITY)
Admission: RE | Admit: 2016-08-11 | Discharge: 2016-08-11 | Disposition: A | Discharge status: Home / Self Care | Payer: 59 | Source: Ambulatory Visit | Attending: Cardiology | Admitting: Cardiology

## 2016-08-11 ENCOUNTER — Ambulatory Visit (INDEPENDENT_AMBULATORY_CARE_PROVIDER_SITE_OTHER): Payer: 59 | Admitting: Internal Medicine

## 2016-08-11 VITALS — BP 108/72 | HR 83 | Ht 72.0 in | Wt 323.0 lb

## 2016-08-11 DIAGNOSIS — I5022 Chronic systolic (congestive) heart failure: Secondary | ICD-10-CM

## 2016-08-11 DIAGNOSIS — I5042 Chronic combined systolic (congestive) and diastolic (congestive) heart failure: Secondary | ICD-10-CM | POA: Diagnosis not present

## 2016-08-11 DIAGNOSIS — I5040 Unspecified combined systolic (congestive) and diastolic (congestive) heart failure: Secondary | ICD-10-CM

## 2016-08-11 NOTE — Patient Instructions (Addendum)
Medication Instructions:  Your physician recommends that you continue on your current medications as directed. Please refer to the Current Medication list given to you today.   Labwork: None ordered   Testing/Procedures: None ordered   Follow-Up: Your physician wants you to follow-up in: 12 months with Dr. Ladona Ridgel [place]. You will receive a reminder letter in the mail two months in advance. If you don't receive a letter, please call our office to schedule the follow-up. Appointment.  Remote monitoring is used to monitor your ICD from home. This monitoring reduces the number of office visits required to check your device to one time per year. It allows [name] to keep an eye on the functioning of your device to ensure it is working properly. You are scheduled for a device check from home on 11/10/16. You may send your transmission at any time that day. If you have a wireless device, the transmission will be sent automatically. After your physician reviews your transmission, you will receive a postcard with your next transmission date.     Any Other Special Instructions Will Be Listed Below (If Applicable).     If you need a refill on your cardiac medications before your next appointment, please call your pharmacy.
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
## 2016-08-11 NOTE — Progress Notes (Signed)
Daily Session Note  Patient Details  Name: CHRISTOHER DRUDGE MRN: [id number] Date of Birth: 02/14/1966 Referring Provider:   Flowsheet Row CARDIAC REHAB PHASE II ORIENTATION from 06/17/2016 in [place]  Referring Provider  Dr. Harl Bowie       Encounter Date: 08/11/2016  Check In:     Session Check In - 08/11/16 0815      Check-In   Location [facility]   Staff Present Aundra Dubin, RN, BSN;Lyana Asbill Luther Parody, BS, EP, Exercise Physiologist   Supervising physician immediately available to respond to emergencies See telemetry face sheet for immediately available MD   Medication changes reported     No   Fall or balance concerns reported    No   Warm-up and Cool-down Performed as group-led instruction   Resistance Training Performed Yes   VAD Patient? No     Pain Assessment   Currently in Pain? No/denies   Pain Score 0-No pain   Multiple Pain Sites No      Capillary Blood Glucose: No results found for this or any previous visit (from the past 24 hour(s)).   Goals Met:  Independence with exercise equipment Exercise tolerated well No report of cardiac concerns or symptoms Strength training completed today  Goals Unmet:  Not Applicable  Comments: Check out 915   Dr. Kate Sable is Medical Director for [place] and Pulmonary Rehab.

## 2016-08-13 ENCOUNTER — Encounter (HOSPITAL_COMMUNITY)
Admission: RE | Admit: 2016-08-13 | Discharge: 2016-08-13 | Disposition: A | Discharge status: Home / Self Care | Payer: 59 | Source: Ambulatory Visit | Attending: Cardiology | Admitting: Cardiology

## 2016-08-13 DIAGNOSIS — I5042 Chronic combined systolic (congestive) and diastolic (congestive) heart failure: Secondary | ICD-10-CM | POA: Diagnosis not present

## 2016-08-13 DIAGNOSIS — I5040 Unspecified combined systolic (congestive) and diastolic (congestive) heart failure: Secondary | ICD-10-CM

## 2016-08-13 LAB — CUP PACEART INCLINIC DEVICE CHECK
Battery Remaining Longevity: 68.4
Brady Statistic RA Percent Paced: 0 %
Brady Statistic RV Percent Paced: 96 %
Date Time Interrogation Session: 20171018182714
HighPow Impedance: 75.375
Implantable Lead Implant Date: 20160629
Implantable Lead Implant Date: 20160629
Implantable Lead Location: 753859
Implantable Lead Location: 753860
Implantable Lead Model: 7122
Lead Channel Impedance Value: 437.5 Ohm
Lead Channel Impedance Value: 475 Ohm
Lead Channel Pacing Threshold Amplitude: 0.75 V
Lead Channel Pacing Threshold Pulse Width: 0.5 ms
Lead Channel Sensing Intrinsic Amplitude: 0.2 mV
Lead Channel Sensing Intrinsic Amplitude: 7.4 mV
Lead Channel Setting Pacing Amplitude: 0.875
Lead Channel Setting Pacing Pulse Width: 0.5 ms
Lead Channel Setting Sensing Sensitivity: 0.5 mV
Pulse Gen Serial Number: 7136879

## 2016-08-13 NOTE — Progress Notes (Signed)
Daily Session Note  Patient Details  Name: Brandon Torres MRN: [id number] Date of Birth: [birth year] Referring Provider:   Flowsheet Row CARDIAC REHAB PHASE II ORIENTATION from 06/17/2016 in [place]  Referring Provider  Dr. Harl Bowie       Encounter Date: 08/13/2016  Check In:     Session Check In - 08/13/16 0815      Check-In   Location [facility]   Staff Present Russella Dar, MS, EP, [facility], Exercise Physiologist;Dymond Gutt Luther Parody, BS, EP, Exercise Physiologist   Supervising physician immediately available to respond to emergencies See telemetry face sheet for immediately available MD   Medication changes reported     No   Fall or balance concerns reported    No   Warm-up and Cool-down Performed as group-led instruction   Resistance Training Performed Yes   VAD Patient? No     Pain Assessment   Currently in Pain? No/denies   Pain Score 0-No pain   Multiple Pain Sites No      Capillary Blood Glucose: No results found for this or any previous visit (from the past 24 hour(s)).   Goals Met:  Independence with exercise equipment Exercise tolerated well No report of cardiac concerns or symptoms Strength training completed today  Goals Unmet:  Not Applicable  Comments: Check out 915   Dr. Kate Sable is Medical Director for [place] and Pulmonary Rehab.

## 2016-08-16 ENCOUNTER — Encounter (HOSPITAL_COMMUNITY)
Admission: RE | Admit: 2016-08-16 | Discharge: 2016-08-16 | Disposition: A | Discharge status: Home / Self Care | Payer: 59 | Source: Ambulatory Visit | Attending: Cardiology | Admitting: Cardiology

## 2016-08-16 DIAGNOSIS — I5040 Unspecified combined systolic (congestive) and diastolic (congestive) heart failure: Secondary | ICD-10-CM

## 2016-08-16 DIAGNOSIS — I5042 Chronic combined systolic (congestive) and diastolic (congestive) heart failure: Secondary | ICD-10-CM | POA: Diagnosis not present

## 2016-08-16 NOTE — Progress Notes (Signed)
Daily Session Note  Patient Details  Name: Brandon Torres MRN: [id number] Date of Birth: [birth year] Referring Provider:   Flowsheet Row CARDIAC REHAB PHASE II ORIENTATION from 06/17/2016 in [place]  Referring Provider  Dr. Harl Bowie       Encounter Date: 08/16/2016  Check In:     Session Check In - 08/16/16 0815      Check-In   Location [facility]   Staff Present Aundra Dubin, RN, BSN;Diane Coad, MS, EP, [facility], Exercise Physiologist   Supervising physician immediately available to respond to emergencies See telemetry face sheet for immediately available MD   Medication changes reported     No   Fall or balance concerns reported    No   Warm-up and Cool-down Performed as group-led instruction   Resistance Training Performed Yes   VAD Patient? No     Pain Assessment   Currently in Pain? No/denies   Pain Score 0-No pain   Multiple Pain Sites No      Capillary Blood Glucose: No results found for this or any previous visit (from the past 24 hour(s)).   Goals Met:  Independence with exercise equipment Exercise tolerated well No report of cardiac concerns or symptoms Strength training completed today  Goals Unmet:  Not Applicable  Comments: Check out 915   Dr. Kate Sable is Medical Director for [place] and Pulmonary Rehab.

## 2016-08-18 ENCOUNTER — Encounter (HOSPITAL_COMMUNITY)
Admission: RE | Admit: 2016-08-18 | Discharge: 2016-08-18 | Disposition: A | Discharge status: Home / Self Care | Payer: 59 | Source: Ambulatory Visit | Attending: Cardiology | Admitting: Cardiology

## 2016-08-18 DIAGNOSIS — I5042 Chronic combined systolic (congestive) and diastolic (congestive) heart failure: Secondary | ICD-10-CM | POA: Diagnosis not present

## 2016-08-18 DIAGNOSIS — I5040 Unspecified combined systolic (congestive) and diastolic (congestive) heart failure: Secondary | ICD-10-CM

## 2016-08-18 NOTE — Progress Notes (Signed)
Daily Session Note  Patient Details  Name: SHAFIN POLLIO MRN: [id number] Date of Birth: [birth year] Referring Provider:   Flowsheet Row CARDIAC REHAB PHASE II ORIENTATION from 06/17/2016 in [place]  Referring Provider  Dr. Harl Bowie       Encounter Date: 08/18/2016  Check In:     Session Check In - 08/18/16 0815      Check-In   Staff Present Aundra Dubin, RN, BSN;Osualdo Hansell Luther Parody, BS, EP, Exercise Physiologist   Supervising physician immediately available to respond to emergencies See telemetry face sheet for immediately available MD   Medication changes reported     No   Fall or balance concerns reported    No   Warm-up and Cool-down Performed as group-led instruction   Resistance Training Performed Yes   VAD Patient? No     Pain Assessment   Currently in Pain? No/denies   Pain Score 0-No pain   Multiple Pain Sites No      Capillary Blood Glucose: No results found for this or any previous visit (from the past 24 hour(s)).   Goals Met:  Independence with exercise equipment Exercise tolerated well No report of cardiac concerns or symptoms Strength training completed today  Goals Unmet:  Not Applicable  Comments: Check out 915   Dr. Kate Sable is Medical Director for [place] and Pulmonary Rehab.

## 2016-08-20 ENCOUNTER — Encounter (HOSPITAL_COMMUNITY)
Admission: RE | Admit: 2016-08-20 | Discharge: 2016-08-20 | Disposition: A | Discharge status: Home / Self Care | Payer: 59 | Source: Ambulatory Visit | Attending: Cardiology | Admitting: Cardiology

## 2016-08-20 DIAGNOSIS — I5040 Unspecified combined systolic (congestive) and diastolic (congestive) heart failure: Secondary | ICD-10-CM

## 2016-08-20 DIAGNOSIS — I5042 Chronic combined systolic (congestive) and diastolic (congestive) heart failure: Secondary | ICD-10-CM | POA: Diagnosis not present

## 2016-08-20 NOTE — Progress Notes (Signed)
Daily Session Note  Patient Details  Name: Brandon Torres MRN: [id number] Date of Birth: 07/25/1966 Referring Provider:   Flowsheet Row CARDIAC REHAB PHASE II ORIENTATION from 06/17/2016 in [place] CARDIAC REHABILITATION  Referring Provider  Dr. Branch       Encounter Date: 08/20/2016  Check In:     Session Check In - 08/20/16 0815      Check-In   Location [facility]   Staff Present Diane Coad, MS, EP, [facility], Exercise Physiologist;Debra Johnson, RN, BSN   Supervising physician immediately available to respond to emergencies See telemetry face sheet for immediately available MD   Medication changes reported     No   Fall or balance concerns reported    No   Warm-up and Cool-down Performed as group-led instruction   Resistance Training Performed Yes   VAD Patient? No     Pain Assessment   Currently in Pain? No/denies   Pain Score 0-No pain      Capillary Blood Glucose: No results found for this or any previous visit (from the past 24 hour(s)).   Goals Met:  Independence with exercise equipment Exercise tolerated well No report of cardiac concerns or symptoms Strength training completed today  Goals Unmet:  Not Applicable  Comments: Check out: 0915   Dr. Suresh Koneswaran is Medical Director for [place] [facility] and Pulmonary Re[facility]. 

## 2016-08-23 ENCOUNTER — Encounter (HOSPITAL_COMMUNITY)
Admission: RE | Admit: 2016-08-23 | Discharge: 2016-08-23 | Disposition: A | Discharge status: Home / Self Care | Payer: 59 | Source: Ambulatory Visit | Attending: Cardiology | Admitting: Cardiology

## 2016-08-23 ENCOUNTER — Other Ambulatory Visit: Payer: Self-pay | Admitting: Cardiology

## 2016-08-23 DIAGNOSIS — I5042 Chronic combined systolic (congestive) and diastolic (congestive) heart failure: Secondary | ICD-10-CM | POA: Diagnosis not present

## 2016-08-23 DIAGNOSIS — I5040 Unspecified combined systolic (congestive) and diastolic (congestive) heart failure: Secondary | ICD-10-CM

## 2016-08-23 NOTE — Progress Notes (Signed)
Daily Session Note  Patient Details  Name: Brandon Torres MRN: [id number] Date of Birth: May 07, 1966 Referring Provider:   Flowsheet Row CARDIAC REHAB PHASE II ORIENTATION from 06/17/2016 in [place]  Referring Provider  Dr. Harl Bowie       Encounter Date: 08/23/2016  Check In:     Session Check In - 08/23/16 0815      Check-In   Location [facility]   Staff Present Russella Dar, MS, EP, [facility], Exercise Physiologist;Alyssamarie Mounsey Luther Parody, BS, EP, Exercise Physiologist   Supervising physician immediately available to respond to emergencies See telemetry face sheet for immediately available MD   Medication changes reported     No   Fall or balance concerns reported    No   Warm-up and Cool-down Performed as group-led instruction   Resistance Training Performed Yes   VAD Patient? No     Pain Assessment   Currently in Pain? No/denies   Pain Score 0-No pain   Multiple Pain Sites No      Capillary Blood Glucose: No results found for this or any previous visit (from the past 24 hour(s)).   Goals Met:  Independence with exercise equipment Exercise tolerated well No report of cardiac concerns or symptoms Strength training completed today  Goals Unmet:  Not Applicable  Comments: Check out 915   Dr. Kate Sable is Medical Director for [place] and Pulmonary Rehab.

## 2016-08-25 ENCOUNTER — Encounter (HOSPITAL_COMMUNITY)
Admission: RE | Admit: 2016-08-25 | Discharge: 2016-08-25 | Disposition: A | Discharge status: Home / Self Care | Payer: 59 | Source: Ambulatory Visit | Attending: Cardiology | Admitting: Cardiology

## 2016-08-25 DIAGNOSIS — Z95810 Presence of automatic (implantable) cardiac defibrillator: Secondary | ICD-10-CM | POA: Insufficient documentation

## 2016-08-25 DIAGNOSIS — I480 Paroxysmal atrial fibrillation: Secondary | ICD-10-CM | POA: Insufficient documentation

## 2016-08-25 DIAGNOSIS — I5042 Chronic combined systolic (congestive) and diastolic (congestive) heart failure: Secondary | ICD-10-CM | POA: Insufficient documentation

## 2016-08-25 DIAGNOSIS — Z7901 Long term (current) use of anticoagulants: Secondary | ICD-10-CM | POA: Insufficient documentation

## 2016-08-25 DIAGNOSIS — G4733 Obstructive sleep apnea (adult) (pediatric): Secondary | ICD-10-CM | POA: Diagnosis not present

## 2016-08-25 DIAGNOSIS — I5040 Unspecified combined systolic (congestive) and diastolic (congestive) heart failure: Secondary | ICD-10-CM

## 2016-08-25 NOTE — Progress Notes (Signed)
Daily Session Note  Patient Details  Name: Brandon Torres MRN: [id number] Date of Birth: [birth year] Referring Provider:   Flowsheet Row CARDIAC REHAB PHASE II ORIENTATION from 06/17/2016 in [place]  Referring Provider  Dr. Harl Bowie       Encounter Date: 08/25/2016  Check In:     Session Check In - 08/25/16 0815      Check-In   Location [facility]   Staff Present Aundra Dubin, RN, BSN;Leldon Steege Luther Parody, BS, EP, Exercise Physiologist   Supervising physician immediately available to respond to emergencies See telemetry face sheet for immediately available MD   Medication changes reported     No   Fall or balance concerns reported    No   Warm-up and Cool-down Performed as group-led instruction   Resistance Training Performed Yes   VAD Patient? No     Pain Assessment   Currently in Pain? No/denies   Pain Score 0-No pain   Multiple Pain Sites No      Capillary Blood Glucose: No results found for this or any previous visit (from the past 24 hour(s)).   Goals Met:  Independence with exercise equipment Exercise tolerated well No report of cardiac concerns or symptoms Strength training completed today  Goals Unmet:  Not Applicable  Comments: Check out 915   Dr. Kate Sable is Medical Director for [place] and Pulmonary Rehab.

## 2016-08-27 ENCOUNTER — Encounter (HOSPITAL_COMMUNITY)
Admission: RE | Admit: 2016-08-27 | Discharge: 2016-08-27 | Disposition: A | Discharge status: Home / Self Care | Payer: 59 | Source: Ambulatory Visit | Attending: Cardiology | Admitting: Cardiology

## 2016-08-27 DIAGNOSIS — I5040 Unspecified combined systolic (congestive) and diastolic (congestive) heart failure: Secondary | ICD-10-CM

## 2016-08-27 DIAGNOSIS — I5042 Chronic combined systolic (congestive) and diastolic (congestive) heart failure: Secondary | ICD-10-CM | POA: Diagnosis not present

## 2016-08-27 NOTE — Progress Notes (Signed)
Daily Session Note  Patient Details  Name: Brandon Torres MRN: [id number] Date of Birth: 05/23/1966 Referring Provider:   Flowsheet Row CARDIAC REHAB PHASE II ORIENTATION from 06/17/2016 in [place] CARDIAC REHABILITATION  Referring Provider  Dr. Branch       Encounter Date: 08/27/2016  Check In:     Session Check In - 08/27/16 0811      Check-In   Location [facility]   Staff Present Debra Johnson, RN, BSN;Diane Coad, MS, EP, [facility], Exercise Physiologist;Pragya Lofaso, BS, EP, Exercise Physiologist   Supervising physician immediately available to respond to emergencies See telemetry face sheet for immediately available MD   Medication changes reported     No   Fall or balance concerns reported    No   Warm-up and Cool-down Performed as group-led instruction   Resistance Training Performed Yes   VAD Patient? No     Pain Assessment   Currently in Pain? No/denies   Pain Score 0-No pain   Multiple Pain Sites No      Capillary Blood Glucose: No results found for this or any previous visit (from the past 24 hour(s)).   Goals Met:  Independence with exercise equipment Exercise tolerated well No report of cardiac concerns or symptoms Strength training completed today  Goals Unmet:  Not Applicable  Comments: Check out 915   Dr. Suresh Koneswaran is Medical Director for [place] [facility] and Pulmonary Rehab. 

## 2016-08-30 ENCOUNTER — Encounter (HOSPITAL_COMMUNITY)
Admission: RE | Admit: 2016-08-30 | Discharge: 2016-08-30 | Disposition: A | Discharge status: Home / Self Care | Payer: 59 | Source: Ambulatory Visit | Attending: Cardiology | Admitting: Cardiology

## 2016-08-30 DIAGNOSIS — I5040 Unspecified combined systolic (congestive) and diastolic (congestive) heart failure: Secondary | ICD-10-CM

## 2016-08-30 DIAGNOSIS — I5042 Chronic combined systolic (congestive) and diastolic (congestive) heart failure: Secondary | ICD-10-CM | POA: Diagnosis not present

## 2016-08-30 NOTE — Progress Notes (Signed)
Daily Session Note  Patient Details  Name: KHALON CANSLER MRN: [id number] Date of Birth: 04/24/1966 Referring Provider:   Flowsheet Row CARDIAC REHAB PHASE II ORIENTATION from 06/17/2016 in [place]  Referring Provider  Dr. Harl Bowie       Encounter Date: 08/30/2016  Check In:     Session Check In - 08/30/16 0815      Check-In   Location [facility]   Staff Present Shawneen Deetz Angelina Pih, MS, EP, [facility], Exercise Physiologist;Gregory Luther Parody, BS, EP, Exercise Physiologist;Debra Wynetta Emery, RN, BSN   Supervising physician immediately available to respond to emergencies See telemetry face sheet for immediately available MD   Medication changes reported     No   Fall or balance concerns reported    No   Warm-up and Cool-down Performed as group-led instruction   Resistance Training Performed Yes   VAD Patient? No     Pain Assessment   Currently in Pain? No/denies   Pain Score 0-No pain   Multiple Pain Sites No      Capillary Blood Glucose: No results found for this or any previous visit (from the past 24 hour(s)).   Goals Met:  Independence with exercise equipment Exercise tolerated well No report of cardiac concerns or symptoms Strength training completed today  Goals Unmet:  Not Applicable  Comments: Check out: 0915   Dr. Kate Sable is Medical Director for [place] and Pulmonary Rehab.

## 2016-09-01 ENCOUNTER — Encounter (HOSPITAL_COMMUNITY)
Admission: RE | Admit: 2016-09-01 | Discharge: 2016-09-01 | Disposition: A | Discharge status: Home / Self Care | Payer: 59 | Source: Ambulatory Visit | Attending: Cardiology | Admitting: Cardiology

## 2016-09-01 DIAGNOSIS — I5042 Chronic combined systolic (congestive) and diastolic (congestive) heart failure: Secondary | ICD-10-CM | POA: Diagnosis not present

## 2016-09-01 DIAGNOSIS — I5040 Unspecified combined systolic (congestive) and diastolic (congestive) heart failure: Secondary | ICD-10-CM

## 2016-09-01 NOTE — Progress Notes (Signed)
Daily Session Note  Patient Details  Name: Brandon Torres MRN: [id number] Date of Birth: 1966-03-10 Referring Provider:   Flowsheet Row CARDIAC REHAB PHASE II ORIENTATION from 06/17/2016 in [place]  Referring Provider  Dr. Harl Bowie       Encounter Date: 09/01/2016  Check In:     Session Check In - 09/01/16 0815      Check-In   Location [facility]   Staff Present Aundra Dubin, RN, BSN;Ellysia Char Luther Parody, BS, EP, Exercise Physiologist   Supervising physician immediately available to respond to emergencies See telemetry face sheet for immediately available MD   Medication changes reported     No   Fall or balance concerns reported    No   Warm-up and Cool-down Performed as group-led instruction   Resistance Training Performed Yes   VAD Patient? No     Pain Assessment   Currently in Pain? No/denies   Pain Score 0-No pain   Multiple Pain Sites No      Capillary Blood Glucose: No results found for this or any previous visit (from the past 24 hour(s)).      Exercise Prescription Changes - 08/31/16 1200      Exercise Review   Progression Yes     Response to Exercise   Blood Pressure (Admit) 100/74   Blood Pressure (Exercise) 122/72   Blood Pressure (Exit) 92/62   Heart Rate (Admit) 84 bpm   Heart Rate (Exercise) 131 bpm   Heart Rate (Exit) 73 bpm   Rating of Perceived Exertion (Exercise) 10   Duration Progress to 30 minutes of continuous aerobic without signs/symptoms of physical distress   Intensity Rest + 30     Progression   Progression Continue progressive overload as per policy without signs/symptoms or physical distress.     Resistance Training   Training Prescription Yes   Weight 5   Reps 10-12     Treadmill   MPH 2.8   Grade 0.5   Minutes 15   METs 3.3     NuStep   Level 3   Watts 67   Minutes 20   METs 3.82     Home Exercise Plan   Plans to continue exercise at Home   Frequency Add 2 additional days to  program exercise sessions.     Goals Met:  Independence with exercise equipment Exercise tolerated well No report of cardiac concerns or symptoms Strength training completed today  Goals Unmet:  Not Applicable  Comments: Check out 915   Dr. Kate Sable is Medical Director for [place] and Pulmonary Rehab.

## 2016-09-03 ENCOUNTER — Encounter (HOSPITAL_COMMUNITY)
Admission: RE | Admit: 2016-09-03 | Discharge: 2016-09-03 | Disposition: A | Discharge status: Home / Self Care | Payer: 59 | Source: Ambulatory Visit | Attending: Cardiology | Admitting: Cardiology

## 2016-09-03 DIAGNOSIS — I5040 Unspecified combined systolic (congestive) and diastolic (congestive) heart failure: Secondary | ICD-10-CM

## 2016-09-03 DIAGNOSIS — I5042 Chronic combined systolic (congestive) and diastolic (congestive) heart failure: Secondary | ICD-10-CM | POA: Diagnosis not present

## 2016-09-03 NOTE — Progress Notes (Signed)
Daily Session Note  Patient Details  Name: Brandon Torres MRN: [id number] Date of Birth: [birth year] Referring Provider:   Flowsheet Row CARDIAC REHAB PHASE II ORIENTATION from 06/17/2016 in [place]  Referring Provider  Dr. Harl Bowie       Encounter Date: 09/03/2016  Check In:     Session Check In - 09/03/16 0815      Check-In   Location [facility]   Staff Present Aundra Dubin, RN, BSN;Charliene Inoue Luther Parody, BS, EP, Exercise Physiologist   Supervising physician immediately available to respond to emergencies See telemetry face sheet for immediately available MD   Medication changes reported     No   Fall or balance concerns reported    No   Warm-up and Cool-down Performed as group-led instruction   Resistance Training Performed Yes   VAD Patient? No     Pain Assessment   Currently in Pain? No/denies   Pain Score 0-No pain   Multiple Pain Sites No      Capillary Blood Glucose: No results found for this or any previous visit (from the past 24 hour(s)).   Goals Met:  Independence with exercise equipment Exercise tolerated well No report of cardiac concerns or symptoms Strength training completed today  Goals Unmet:  Not Applicable  Comments: Check out 915   Dr. Kate Sable is Medical Director for [place] and Pulmonary Rehab.

## 2016-09-06 ENCOUNTER — Encounter (HOSPITAL_COMMUNITY)
Admission: RE | Admit: 2016-09-06 | Discharge: 2016-09-06 | Disposition: A | Discharge status: Home / Self Care | Payer: 59 | Source: Ambulatory Visit | Attending: Cardiology | Admitting: Cardiology

## 2016-09-06 DIAGNOSIS — I5042 Chronic combined systolic (congestive) and diastolic (congestive) heart failure: Secondary | ICD-10-CM | POA: Diagnosis not present

## 2016-09-06 DIAGNOSIS — I5040 Unspecified combined systolic (congestive) and diastolic (congestive) heart failure: Secondary | ICD-10-CM

## 2016-09-06 NOTE — Progress Notes (Signed)
Daily Session Note  Patient Details  Name: MAVERIC DEBONO MRN: [id number] Date of Birth: 12/26/1965 Referring Provider:   Flowsheet Row CARDIAC REHAB PHASE II ORIENTATION from 06/17/2016 in [place]  Referring Provider  Dr. Harl Bowie       Encounter Date: 09/06/2016  Check In:     Session Check In - 09/06/16 0815      Check-In   Location [facility]   Staff Present Suzanne Boron, BS, EP, Exercise Physiologist;Debra Wynetta Emery, RN, BSN   Supervising physician immediately available to respond to emergencies See telemetry face sheet for immediately available MD   Medication changes reported     No   Fall or balance concerns reported    No   Warm-up and Cool-down Performed as group-led instruction   Resistance Training Performed Yes   VAD Patient? No     Pain Assessment   Currently in Pain? No/denies   Pain Score 0-No pain   Multiple Pain Sites No      Capillary Blood Glucose: No results found for this or any previous visit (from the past 24 hour(s)).   Goals Met:  Independence with exercise equipment Exercise tolerated well No report of cardiac concerns or symptoms Strength training completed today  Goals Unmet:  Not Applicable  Comments: Check out 915   Dr. Kate Sable is Medical Director for [place] and Pulmonary Rehab.

## 2016-09-08 ENCOUNTER — Encounter (HOSPITAL_COMMUNITY)
Admission: RE | Admit: 2016-09-08 | Discharge: 2016-09-08 | Disposition: A | Discharge status: Home / Self Care | Payer: 59 | Source: Ambulatory Visit | Attending: Cardiology | Admitting: Cardiology

## 2016-09-08 DIAGNOSIS — I5040 Unspecified combined systolic (congestive) and diastolic (congestive) heart failure: Secondary | ICD-10-CM

## 2016-09-08 DIAGNOSIS — I5042 Chronic combined systolic (congestive) and diastolic (congestive) heart failure: Secondary | ICD-10-CM | POA: Diagnosis not present

## 2016-09-08 NOTE — Progress Notes (Signed)
Daily Session Note  Patient Details  Name: LAKENDRICK PARADIS MRN: [id number] Date of Birth: Jul 24, 1966 Referring Provider:   Flowsheet Row CARDIAC REHAB PHASE II ORIENTATION from 06/17/2016 in [place]  Referring Provider  Dr. Harl Bowie       Encounter Date: 09/08/2016  Check In:     Session Check In - 09/08/16 0815      Check-In   Location [facility]   Staff Present Aundra Dubin, RN, BSN;Leronda Lewers Luther Parody, BS, EP, Exercise Physiologist   Supervising physician immediately available to respond to emergencies See telemetry face sheet for immediately available MD   Medication changes reported     No   Fall or balance concerns reported    No   Warm-up and Cool-down Performed as group-led instruction   Resistance Training Performed Yes   VAD Patient? No     Pain Assessment   Currently in Pain? No/denies   Pain Score 0-No pain   Multiple Pain Sites No      Capillary Blood Glucose: No results found for this or any previous visit (from the past 24 hour(s)).   Goals Met:  Independence with exercise equipment Exercise tolerated well No report of cardiac concerns or symptoms Strength training completed today  Goals Unmet:  Not Applicable  Comments: Check out 915   Dr. Kate Sable is Medical Director for [facility].

## 2016-09-08 NOTE — Progress Notes (Signed)
Cardiac Individual Treatment Plan  Patient Details  Name: Brandon Torres MRN: [id number] Date of Birth: December 05, 1965 Referring Provider:   Flowsheet Row CARDIAC REHAB PHASE II ORIENTATION from 06/17/2016 in [facility] CARDIAC REHABILITATION  Referring Provider  Dr. Wyline Mood       Initial Encounter Date:  Flowsheet Row CARDIAC REHAB PHASE II ORIENTATION from 06/17/2016 in [place] CARDIAC REHABILITATION  Date  06/17/16  Referring Provider  Dr. Wyline Mood       Visit Diagnosis: Combined systolic and diastolic congestive heart failure, NYHA class 2, unspecified congestive heart failure chronicity (HCC)  Patient's Home Medications on Admission:  Current Outpatient Prescriptions:  .  acetaminophen (TYLENOL) 500 MG tablet, Take 500 mg by mouth every 6 (six) hours as needed for mild pain or headache. , Disp: , Rfl:  .  furosemide (LASIX) 40 MG tablet, Take 40 mg by mouth daily as needed (swelling)., Disp: , Rfl:  .  metoprolol succinate (TOPROL-XL) 100 MG 24 hr tablet, TAKE (1) TABLET BY MOUTH TWICE DAILY. TAKE WITH OR IMMEDIATELY FOLLOWING A MEAL., Disp: 60 tablet, Rfl: 0 .  sacubitril-valsartan (ENTRESTO) 49-51 MG, Take 1 tablet by mouth 2 (two) times daily., Disp: 180 tablet, Rfl: 3 .  XARELTO 20 MG TABS tablet, TAKE ONE TABLET BY MOUTH EVERY DAY WITH DINNER., Disp: 30 tablet, Rfl: 6  Past Medical History: Past Medical History:  Diagnosis Date  . AICD (automatic cardioverter/defibrillator) present   . CHF (congestive heart failure) (HCC)   . Chronic anticoagulation   . Dysrhythmia   . Morbid obesity (HCC)   . Non-ischemic cardiomyopathy (HCC)   . OSA on CPAP    "have mask; can't tolerate mask" (04/23/2015)  . PAF (paroxysmal atrial fibrillation) (HCC)    RFA at [facility]+ dofetilide  . PONV (postoperative nausea and vomiting)    nausea  . Wears glasses     Tobacco Use: History  Smoking Status  . Never Smoker  Smokeless Tobacco  . Never Used    Labs: Recent Review Flowsheet Data     Labs for ITP Cardiac and Pulmonary Rehab Latest Ref Rng & Units 03/05/2008 01/04/2012 11/12/2014 11/12/2014 10/06/2015   Cholestrol 0 - 200 mg/dL 005 110 - - 211   LDLCALC 0 - 99 mg/dL 81 173(V) - - 670(L)   HDL >40 mg/dL 50 46 - - 48   Trlycerides <150 mg/dL 94 66 - - 81   Hemoglobin A1c 4.8 - 5.6 % - - - - 5.4   PHART 7.350 - 7.450 - - - 7.361 -   PCO2ART 35.0 - 45.0 mmHg - - - 40.6 -   HCO3 20.0 - 24.0 mEq/L - - 23.6 23.0 -   TCO2 0 - 100 mmol/L - - 25 24 -   ACIDBASEDEF 0.0 - 2.0 mmol/L - - 2.0 2.0 -   O2SAT % - - 69.0 98.0 -      Capillary Blood Glucose: No results found for: GLUCAP   Exercise Target Goals:    Exercise Program Goal: Individual exercise prescription set with THRR, safety & activity barriers. Participant demonstrates ability to understand and report RPE using BORG scale, to self-measure pulse accurately, and to acknowledge the importance of the exercise prescription.  Exercise Prescription Goal: Starting with aerobic activity 30 plus minutes a day, 3 days per week for initial exercise prescription. Provide home exercise prescription and guidelines that participant acknowledges understanding prior to discharge.  Activity Barriers & Risk Stratification:     Activity Barriers & Cardiac Risk  Stratification - 06/17/16 1601      Activity Barriers & Cardiac Risk Stratification   Activity Barriers None   Cardiac Risk Stratification High      6 Minute Walk:     6 Minute Walk    Row Name 06/17/16 1543         6 Minute Walk   Phase Initial     Distance 1500 feet     Walk Time 6 minutes     # of Rest Breaks 0     MPH 2.84     METS 3.17     RPE 12     Perceived Dyspnea  13     VO2 Peak 13.11     Symptoms No     Resting HR 61 bpm     Resting BP 110/64     Max Ex. HR 137 bpm     Max Ex. BP 126/76     2 Minute Post BP 108/72        Initial Exercise Prescription:     Initial Exercise Prescription - 06/17/16 1500      Date of Initial Exercise RX  and Referring Provider   Date 06/17/16   Referring Provider Dr. Wyline Mood      Treadmill   MPH 1.3   Grade 0   Minutes 15   METs 1.9     NuStep   Level 2   Watts 15   Minutes 20   METs 1.9     Prescription Details   Frequency (times per week) 3   Duration Progress to 30 minutes of continuous aerobic without signs/symptoms of physical distress     Intensity   THRR REST +  30   THRR 40-80% of Max Heartrate 105-128-150   Ratings of Perceived Exertion 11-13   Perceived Dyspnea 0-4     Progression   Progression Continue progressive overload as per policy without signs/symptoms or physical distress.     Resistance Training   Training Prescription Yes   Weight 1   Reps 10-12      Perform Capillary Blood Glucose checks as needed.  Exercise Prescription Changes:      Exercise Prescription Changes    Row Name 07/08/16 0800 08/03/16 0800 08/31/16 1200         Exercise Review   Progression Yes Yes Yes       Response to Exercise   Blood Pressure (Admit) 106/62 98/64 100/74     Blood Pressure (Exercise) 142/78 128/80 122/72     Blood Pressure (Exit) 106/72 104/72 92/62     Heart Rate (Admit) 60 bpm 83 bpm 84 bpm     Heart Rate (Exercise) 100 bpm 129 bpm 131 bpm     Heart Rate (Exit) 67 bpm 83 bpm 73 bpm     Rating of Perceived Exertion (Exercise) 9 9 10      Duration Progress to 30 minutes of continuous aerobic without signs/symptoms of physical distress Progress to 30 minutes of continuous aerobic without signs/symptoms of physical distress Progress to 30 minutes of continuous aerobic without signs/symptoms of physical distress     Intensity Rest + 30 Rest + 30 Rest + 30       Progression   Progression Continue progressive overload as per policy without signs/symptoms or physical distress. Continue progressive overload as per policy without signs/symptoms or physical distress. Continue progressive overload as per policy without signs/symptoms or physical distress.        Resistance Training  Training Prescription Yes Yes Yes     Weight 2 4 5      Reps 10-12 10-12 10-12       Treadmill   MPH 1.8 2.4 2.8     Grade 0 0 0.5     Minutes 15 15 15      METs 2.3 2.8 3.3       NuStep   Level 2 3 3      Watts 26 61 67     Minutes 20 20 20      METs 3.69 3.82 3.82       Home Exercise Plan   Plans to continue exercise at Home Home Home     Frequency Add 2 additional days to program exercise sessions. Add 2 additional days to program exercise sessions. Add 2 additional days to program exercise sessions.        Exercise Comments:      Exercise Comments    Row Name 07/08/16 1610 08/03/16 0820 08/31/16 1257       Exercise Comments Patient is proggressing appropriately Patient is progressing well  Patient is proggressing well.         Discharge Exercise Prescription (Final Exercise Prescription Changes):     Exercise Prescription Changes - 08/31/16 1200      Exercise Review   Progression Yes     Response to Exercise   Blood Pressure (Admit) 100/74   Blood Pressure (Exercise) 122/72   Blood Pressure (Exit) 92/62   Heart Rate (Admit) 84 bpm   Heart Rate (Exercise) 131 bpm   Heart Rate (Exit) 73 bpm   Rating of Perceived Exertion (Exercise) 10   Duration Progress to 30 minutes of continuous aerobic without signs/symptoms of physical distress   Intensity Rest + 30     Progression   Progression Continue progressive overload as per policy without signs/symptoms or physical distress.     Resistance Training   Training Prescription Yes   Weight 5   Reps 10-12     Treadmill   MPH 2.8   Grade 0.5   Minutes 15   METs 3.3     NuStep   Level 3   Watts 67   Minutes 20   METs 3.82     Home Exercise Plan   Plans to continue exercise at Home   Frequency Add 2 additional days to program exercise sessions.      Nutrition:  Target Goals: Understanding of nutrition guidelines, daily intake of sodium 1500mg , cholesterol 200mg , calories 30%  from fat and 7% or less from saturated fats, daily to have 5 or more servings of fruits and vegetables.  Biometrics:     Pre Biometrics - 06/17/16 1542      Pre Biometrics   Height 6' (1.829 m)   Waist Circumference 50.5 inches   Hip Circumference 51 inches   Waist to Hip Ratio 0.99 %   BMI (Calculated) 43.9   Triceps Skinfold 18 mm   % Body Fat 38 %   Grip Strength 129.7 kg   Flexibility 0 in   Single Leg Stand 37 seconds       Nutrition Therapy Plan and Nutrition Goals:   Nutrition Discharge: Rate Your Plate Scores:     Nutrition Assessments - 06/17/16 1607      MEDFICTS Scores   Pre Score 48      Nutrition Goals Re-Evaluation:   Psychosocial: Target Goals: Acknowledge presence or absence of depression, maximize coping skills, provide positive support system. Participant is  able to verbalize types and ability to use techniques and skills needed for reducing stress and depression.  Initial Review & Psychosocial Screening:     Initial Psych Review & Screening - 06/17/16 1615      Initial Review   Current issues with --  Patient does not have any psychosocial issues or concerns.      Family Dynamics   Good Support System? Yes     Barriers   Psychosocial barriers to participate in program There are no identifiable barriers or psychosocial needs.     Screening Interventions   Interventions Encouraged to exercise      Quality of Life Scores:     Quality of Life - 06/17/16 1546      Quality of Life Scores   Health/Function Pre 17.83 %   Socioeconomic Pre 20.93 %   Psych/Spiritual Pre 21.14 %   Family Pre 23.75 %   GLOBAL Pre 19.91 %      PHQ-9: Recent Review Flowsheet Data    Depression screen [facility] 2/9 06/17/2016   Decreased Interest 0   Down, Depressed, Hopeless 0   PHQ - 2 Score 0   Altered sleeping 0   Tired, decreased energy 1   Change in appetite 0   Feeling bad or failure about yourself  0   Trouble concentrating 0   Moving slowly  or fidgety/restless 0   Suicidal thoughts 0   PHQ-9 Score 1   Difficult doing work/chores Not difficult at all      Psychosocial Evaluation and Intervention:     Psychosocial Evaluation - 06/17/16 1617      Psychosocial Evaluation & Interventions   Interventions Encouraged to exercise with the program and follow exercise prescription   Continued Psychosocial Services Needed No      Psychosocial Re-Evaluation:     Psychosocial Re-Evaluation    Row Name 07/09/16 1449 08/04/16 1517 09/08/16 1412         Psychosocial Re-Evaluation   Interventions Encouraged to attend Cardiac Rehabilitation for the exercise Encouraged to attend Cardiac Rehabilitation for the exercise Encouraged to attend Cardiac Rehabilitation for the exercise     Comments Patient's QOL score was 19.91 and his PHQ-9 score was 1. He says he is not depressed and does not need counseling.  Patient's QOL score was 19.91 and his PHQ-9 score was 1. He says he is not depressed and does not need counseling.  Patient continues to have no psychosocial issues identified.      Continued Psychosocial Services Needed  -- No No        Vocational Rehabilitation: Provide vocational rehab assistance to qualifying candidates.   Vocational Rehab Evaluation & Intervention:     Vocational Rehab - 06/17/16 1607      Initial Vocational Rehab Evaluation & Intervention   Assessment shows need for Vocational Rehabilitation No      Education: Education Goals: Education classes will be provided on a weekly basis, covering required topics. Participant will state understanding/return demonstration of topics presented.  Learning Barriers/Preferences:     Learning Barriers/Preferences - 06/17/16 1606      Learning Barriers/Preferences   Learning Barriers None   Learning Preferences Skilled Demonstration      Education Topics: Hypertension, Hypertension Reduction -Define heart disease and high blood pressure. Discus how high  blood pressure affects the body and ways to reduce high blood pressure.   Exercise and Your Heart -Discuss why it is important to exercise, the FITT principles of exercise, normal and  abnormal responses to exercise, and how to exercise safely.   Angina -Discuss definition of angina, causes of angina, treatment of angina, and how to decrease risk of having angina.   Cardiac Medications -Review what the following cardiac medications are used for, how they affect the body, and side effects that may occur when taking the medications.  Medications include Aspirin, Beta blockers, calcium channel blockers, ACE Inhibitors, angiotensin receptor blockers, diuretics, digoxin, and antihyperlipidemics.   Congestive Heart Failure -Discuss the definition of CHF, how to live with CHF, the signs and symptoms of CHF, and how keep track of weight and sodium intake.   Heart Disease and Intimacy -Discus the effect sexual activity has on the heart, how changes occur during intimacy as we age, and safety during sexual activity.   Smoking Cessation / COPD -Discuss different methods to quit smoking, the health benefits of quitting smoking, and the definition of COPD. Flowsheet Row CARDIAC REHAB PHASE II EXERCISE from 08/11/2016 in [place] CARDIAC REHABILITATION  Date  07/07/16  Educator  DJ  Instruction Review Code  2- meets goals/outcomes      Nutrition I: Fats -Discuss the types of cholesterol, what cholesterol does to the heart, and how cholesterol levels can be controlled. Flowsheet Row CARDIAC REHAB PHASE II EXERCISE from 08/11/2016 in [place] CARDIAC REHABILITATION  Date  07/14/16  Educator  Hart Rochester  Instruction Review Code  2- meets goals/outcomes      Nutrition II: Labels -Discuss the different components of food labels and how to read food label Flowsheet Row CARDIAC REHAB PHASE II EXERCISE from 08/11/2016 in [place] CARDIAC REHABILITATION  Date  07/21/16  Educator  Hart Rochester  Instruction Review Code  2- meets goals/outcomes      Heart Parts and Heart Disease -Discuss the anatomy of the heart, the pathway of blood circulation through the heart, and these are affected by heart disease. Flowsheet Row CARDIAC REHAB PHASE II EXERCISE from 08/11/2016 in [place] CARDIAC REHABILITATION  Date  07/28/16  Educator  DC  Instruction Review Code  2- meets goals/outcomes      Stress I: Signs and Symptoms -Discuss the causes of stress, how stress may lead to anxiety and depression, and ways to limit stress. Flowsheet Row CARDIAC REHAB PHASE II EXERCISE from 08/11/2016 in [place] CARDIAC REHABILITATION  Date  08/04/16  Educator  Hart Rochester  Instruction Review Code  2- meets goals/outcomes      Stress II: Relaxation -Discuss different types of relaxation techniques to limit stress. Flowsheet Row CARDIAC REHAB PHASE II EXERCISE from 08/11/2016 in [place] CARDIAC REHABILITATION  Date  08/11/16  Educator  Hart Rochester  Instruction Review Code  2- meets goals/outcomes      Warning Signs of Stroke / TIA -Discuss definition of a stroke, what the signs and symptoms are of a stroke, and how to identify when someone is having stroke.   Knowledge Questionnaire Score:     Knowledge Questionnaire Score - 06/17/16 1606      Knowledge Questionnaire Score   Pre Score 22/24      Core Components/Risk Factors/Patient Goals at Admission:     Personal Goals and Risk Factors at Admission - 06/17/16 1608      Core Components/Risk Factors/Patient Goals on Admission    Weight Management Yes   Intervention Weight Management: Develop a combined nutrition and exercise program designed to reach desired caloric intake, while maintaining appropriate intake of nutrient and fiber, sodium and fats, and appropriate  energy expenditure required for the weight goal.;Weight Management/Obesity: Establish reasonable short term and long term weight goals.   Admit Weight 323 lb  (146.5 kg)   Goal Weight: Short Term 318 lb (144.2 kg)   Goal Weight: Long Term 303 lb (137.4 kg)   Expected Outcomes Short Term: Continue to assess and modify interventions until short term weight is achieved;Long Term: Adherence to nutrition and physical activity/exercise program aimed toward attainment of established weight goal   Sedentary Yes   Intervention Provide advice, education, support and counseling about physical activity/exercise needs.;Develop an individualized exercise prescription for aerobic and resistive training based on initial evaluation findings, risk stratification, comorbidities and participant's personal goals.   Expected Outcomes Achievement of increased cardiorespiratory fitness and enhanced flexibility, muscular endurance and strength shown through measurements of functional capacity and personal statement of participant.   Increase Strength and Stamina Yes   Intervention Provide advice, education, support and counseling about physical activity/exercise needs.;Develop an individualized exercise prescription for aerobic and resistive training based on initial evaluation findings, risk stratification, comorbidities and participant's personal goals.   Expected Outcomes Achievement of increased cardiorespiratory fitness and enhanced flexibility, muscular endurance and strength shown through measurements of functional capacity and personal statement of participant.   Heart Failure Yes   Intervention Provide a combined exercise and nutrition program that is supplemented with education, support and counseling about heart failure. Directed toward relieving symptoms such as shortness of breath, decreased exercise tolerance, and extremity edema.   Expected Outcomes Improve functional capacity of life;Short term: Daily weights obtained and reported for increase. Utilizing diuretic protocols set by physician.;Long term: Adoption of self-care skills and reduction of barriers for early  signs and symptoms recognition and intervention leading to self-care maintenance.   Personal Goal Other Yes   Personal Goal Get heart built up again and lose 20 lbs.    Intervention Patient will exercise 3 days/week in program and supplement with 2 days/week of excerise at home.    Expected Outcomes Patient will reach the above stated goals.       Core Components/Risk Factors/Patient Goals Review:      Goals and Risk Factor Review    Row Name 06/17/16 1615 07/09/16 1446 08/04/16 1515 09/08/16 1410       Core Components/Risk Factors/Patient Goals Review   Personal Goals Review Weight Management/Obesity;Increase Strength and Stamina;Heart Failure Weight Management/Obesity;Increase Strength and Stamina;Other  Get heart built up again.  Weight Management/Obesity;Increase Strength and Stamina;Other  Get heart built up again. Weight Management/Obesity;Increase Strength and Stamina;Other  Get heart built up again.    Review  -- Patient has had 4 sessions. He says it is too soon to tell if program has improved his strength. Wil continue to progress. After 14 sessions, patient has lost 0.5 lbs. His strength and stamina are improving. He is progressing well in the program.  Patient has attend 29 sessions and will complete the program after 1 more session. He has lost 3.8 lbs with increased strength and stamina. He says he feel better overall and that the program has really helped him.    Expected Outcomes  -- Patient will continue to attend sessions improving his strength and stamina and meet his weight loss goal of 20 lbs.  Patient will continue to attend sessions and complete the program improving his strength and stamina and lose weight.  Patient will graduate meeting his personal goals.        Core Components/Risk Factors/Patient Goals at Discharge (Final Review):  Goals and Risk Factor Review - 09/08/16 1410      Core Components/Risk Factors/Patient Goals Review   Personal Goals Review  Weight Management/Obesity;Increase Strength and Stamina;Other  Get heart built up again.   Review Patient has attend 29 sessions and will complete the program after 1 more session. He has lost 3.8 lbs with increased strength and stamina. He says he feel better overall and that the program has really helped him.   Expected Outcomes Patient will graduate meeting his personal goals.       ITP Comments:   Comments: .ITP 30 Day REVIEW Patient doing well with the program. Will continue to monitor for progress.

## 2016-09-10 ENCOUNTER — Encounter (HOSPITAL_COMMUNITY)
Admission: RE | Admit: 2016-09-10 | Discharge: 2016-09-10 | Disposition: A | Discharge status: Home / Self Care | Payer: 59 | Source: Ambulatory Visit | Attending: Cardiology | Admitting: Cardiology

## 2016-09-10 VITALS — Ht 72.0 in | Wt 323.6 lb

## 2016-09-10 DIAGNOSIS — I5042 Chronic combined systolic (congestive) and diastolic (congestive) heart failure: Secondary | ICD-10-CM | POA: Diagnosis not present

## 2016-09-10 DIAGNOSIS — I5040 Unspecified combined systolic (congestive) and diastolic (congestive) heart failure: Secondary | ICD-10-CM

## 2016-09-10 NOTE — Progress Notes (Signed)
Discharge Summary  Patient Details  Name: Brandon Torres MRN: [id number] Date of Birth: 03-02-1966 Referring Provider:   Flowsheet Row CARDIAC REHAB PHASE II ORIENTATION [facility]  Referring Provider  Dr. Wyline MoodBranch        Number of Visits: 30  Reason for Discharge:  Patient reached a stable level of exercise. Patient independent in their exercise.  Smoking History:  History  Smoking Status  . Never Smoker  Smokeless Tobacco  . Never Used    Diagnosis:  Combined systolic and diastolic congestive heart failure, NYHA class 2, unspecified congestive heart failure chronicity (HCC)  ADL UCSD:   Initial Exercise Prescription:     Initial Exercise Prescription - 06/17/16 1500      Date of Initial Exercise RX and Referring Provider   Date 06/17/16   Referring Provider Dr. Wyline MoodBranch      Treadmill   MPH 1.3   Grade 0   Minutes 15   METs 1.9     NuStep   Level 2   Watts 15   Minutes 20   METs 1.9     Prescription Details   Frequency (times per week) 3   Duration Progress to 30 minutes of continuous aerobic without signs/symptoms of physical distress     Intensity   THRR REST +  30   THRR 40-80% of Max Heartrate 105-128-150   Ratings of Perceived Exertion 11-13   Perceived Dyspnea 0-4     Progression   Progression Continue progressive overload as per policy without signs/symptoms or physical distress.     Resistance Training   Training Prescription Yes   Weight 1   Reps 10-12      Discharge Exercise Prescription (Final Exercise Prescription Changes):     Exercise Prescription Changes - 08/31/16 1200      Exercise Review   Progression Yes     Response to Exercise   Blood Pressure (Admit) 100/74   Blood Pressure (Exercise) 122/72   Blood Pressure (Exit) 92/62   Heart Rate (Admit) 84 bpm   Heart Rate (Exercise) 131 bpm   Heart Rate (Exit) 73 bpm   Rating of Perceived Exertion (Exercise) 10   Duration  Progress to 30 minutes of continuous aerobic without signs/symptoms of physical distress   Intensity Rest + 30     Progression   Progression Continue progressive overload as per policy without signs/symptoms or physical distress.     Resistance Training   Training Prescription Yes   Weight 5   Reps 10-12     Treadmill   MPH 2.8   Grade 0.5   Minutes 15   METs 3.3     NuStep   Level 3   Watts 67   Minutes 20   METs 3.82     Home Exercise Plan   Plans to continue exercise at Home   Frequency Add 2 additional days to program exercise sessions.      Functional Capacity:     6 Minute Walk    Row Name 06/17/16 1543 09/10/16 1356       6 Minute Walk   Phase Initial Discharge    Distance 1500 feet 1700 feet    Distance % Change  -- 13.33 %    Walk Time 6 minutes 6 minutes    # of Rest Breaks 0 0    MPH 2.84 3.2    METS 3.17 3.46    RPE 12 11    Perceived  Dyspnea  13 11    VO2 Peak 13.11 14.36    Symptoms No No    Resting HR 61 bpm 99 bpm    Resting BP 110/64 100/74    Max Ex. HR 137 bpm 140 bpm    Max Ex. BP 126/76 132/92    2 Minute Post BP 108/72 112/84       Psychological, QOL, Others - Outcomes: PHQ 2/9: Depression screen [employer] 2/9 09/10/2016 06/17/2016  Decreased Interest 0 0  Down, Depressed, Hopeless 0 0  PHQ - 2 Score 0 0  Altered sleeping 0 0  Tired, decreased energy 0 1  Change in appetite 0 0  Feeling bad or failure about yourself  0 0  Trouble concentrating 0 0  Moving slowly or fidgety/restless 0 0  Suicidal thoughts 0 0  PHQ-9 Score 0 1  Difficult doing work/chores - Not difficult at all    Quality of Life:     Quality of Life - 09/10/16 1400      Quality of Life Scores   Health/Function Pre 17.83 %   Health/Function Post 24.2 %   Health/Function % Change 35.73 %   Socioeconomic Pre 20.93 %   Socioeconomic Post 24 %   Socioeconomic % Change  14.67 %   Psych/Spiritual Pre 21.14 %   Psych/Spiritual Post 23.57 %   Psych/Spiritual  % Change 11.49 %   Family Pre 23.75 %   Family Post 27.88 %   Family % Change 17.39 %   GLOBAL Pre 19.91 %   GLOBAL Post 24.48 %   GLOBAL % Change 22.95 %      Personal Goals: Goals established at orientation with interventions provided to work toward goal.     Personal Goals and Risk Factors at Admission - 06/17/16 1608      Core Components/Risk Factors/Patient Goals on Admission    Weight Management Yes   Intervention Weight Management: Develop a combined nutrition and exercise program designed to reach desired caloric intake, while maintaining appropriate intake of nutrient and fiber, sodium and fats, and appropriate energy expenditure required for the weight goal.;Weight Management/Obesity: Establish reasonable short term and long term weight goals.   Admit Weight 323 lb (146.5 kg)   Goal Weight: Short Term 318 lb (144.2 kg)   Goal Weight: Long Term 303 lb (137.4 kg)   Expected Outcomes Short Term: Continue to assess and modify interventions until short term weight is achieved;Long Term: Adherence to nutrition and physical activity/exercise program aimed toward attainment of established weight goal   Sedentary Yes   Intervention Provide advice, education, support and counseling about physical activity/exercise needs.;Develop an individualized exercise prescription for aerobic and resistive training based on initial evaluation findings, risk stratification, comorbidities and participant's personal goals.   Expected Outcomes Achievement of increased cardiorespiratory fitness and enhanced flexibility, muscular endurance and strength shown through measurements of functional capacity and personal statement of participant.   Increase Strength and Stamina Yes   Intervention Provide advice, education, support and counseling about physical activity/exercise needs.;Develop an individualized exercise prescription for aerobic and resistive training based on initial evaluation findings, risk  stratification, comorbidities and participant's personal goals.   Expected Outcomes Achievement of increased cardiorespiratory fitness and enhanced flexibility, muscular endurance and strength shown through measurements of functional capacity and personal statement of participant.   Heart Failure Yes   Intervention Provide a combined exercise and nutrition program that is supplemented with education, support and counseling about heart failure. Directed toward relieving symptoms such  as shortness of breath, decreased exercise tolerance, and extremity edema.   Expected Outcomes Improve functional capacity of life;Short term: Daily weights obtained and reported for increase. Utilizing diuretic protocols set by physician.;Long term: Adoption of self-care skills and reduction of barriers for early signs and symptoms recognition and intervention leading to self-care maintenance.   Personal Goal Other Yes   Personal Goal Get heart built up again and lose 20 lbs.    Intervention Patient will exercise 3 days/week in program and supplement with 2 days/week of excerise at home.    Expected Outcomes Patient will reach the above stated goals.        Personal Goals Discharge:     Goals and Risk Factor Review    Row Name 06/17/16 1615 07/09/16 1446 08/04/16 1515 09/08/16 1410 09/10/16 1520     Core Components/Risk Factors/Patient Goals Review   Personal Goals Review Weight Management/Obesity;Increase Strength and Stamina;Heart Failure Weight Management/Obesity;Increase Strength and Stamina;Other  Get heart built up again.  Weight Management/Obesity;Increase Strength and Stamina;Other  Get heart built up again. Weight Management/Obesity;Increase Strength and Stamina;Other  Get heart built up again. Weight Management/Obesity;Increase Strength and Stamina   Review  -- Patient has had 4 sessions. He says it is too soon to tell if program has improved his strength. Wil continue to progress. After 14 sessions,  patient has lost 0.5 lbs. His strength and stamina are improving. He is progressing well in the program.  Patient has attend 29 sessions and will complete the program after 1 more session. He has lost 3.8 lbs with increased strength and stamina. He says he feel better overall and that the program has really helped him. Upon graduation, patient lost 3.8 lbs with increased strength and stamina. He says he feels better overall and that the program has really helped him   Expected Outcomes  -- Patient will continue to attend sessions improving his strength and stamina and meet his weight loss goal of 20 lbs.  Patient will continue to attend sessions and complete the program improving his strength and stamina and lose weight.  Patient will graduate meeting his personal goals.  Patient will continue to exercise and eat a heart healthy diet and will continue to meet his personal goals.       Nutrition & Weight - Outcomes:     Pre Biometrics - 09/10/16 1359      Pre Biometrics   Height 6' (1.829 m)   Weight (!)  323 lb 10.2 oz (146.8 kg)   Waist Circumference 49.5 inches   Hip Circumference 51 inches   Waist to Hip Ratio 0.97 %   BMI (Calculated) 44   Triceps Skinfold 17 mm   % Body Fat 37.3 %   Grip Strength 128.33 kg   Flexibility 0 in   Single Leg Stand 11 seconds       Nutrition:   Nutrition Discharge:     Nutrition Assessments - 09/10/16 1519      MEDFICTS Scores   Pre Score 27   Post Score 48   Score Difference 21      Education Questionnaire Score:     Knowledge Questionnaire Score - 09/10/16 1519      Knowledge Questionnaire Score   Pre Score 22/24   Post Score 23/24      Goals reviewed with patient; copy given to patient.

## 2016-09-10 NOTE — Progress Notes (Signed)
Daily Session Note  Patient Details  Name: Brandon Torres MRN: [id number] Date of Birth: 11/19/1965 Referring Provider:   Flowsheet Row CARDIAC REHAB PHASE II ORIENTATION from 06/17/2016 in [facility]  Referring Provider  Dr. Branch       Encounter Date: 09/10/2016  Check In:     Session Check In - 09/10/16 0815      Check-In   Location [facility]   Staff Present Diane Coad, MS, EP, CHC, Exercise Physiologist;Debra Johnson, RN, BSN   Supervising physician immediately available to respond to emergencies See telemetry face sheet for immediately available MD   Medication changes reported     No   Fall or balance concerns reported    No   Warm-up and Cool-down Performed as group-led instruction   Resistance Training Performed Yes   VAD Patient? No     Pain Assessment   Currently in Pain? No/denies   Pain Score 0-No pain   Multiple Pain Sites No      Capillary Blood Glucose: No results found for this or any previous visit (from the past 24 hour(s)).   Goals Met:  Independence with exercise equipment Exercise tolerated well No report of cardiac concerns or symptoms Strength training completed today  Goals Unmet:  Not Applicable  Comments: Check out: 0915   Dr. Suresh Koneswaran is Medical Director for [facility]. 

## 2016-09-13 ENCOUNTER — Encounter (HOSPITAL_COMMUNITY): Payer: 59

## 2016-09-15 ENCOUNTER — Encounter (HOSPITAL_COMMUNITY): Payer: 59

## 2016-09-17 ENCOUNTER — Encounter (HOSPITAL_COMMUNITY): Payer: 59

## 2016-09-20 ENCOUNTER — Encounter (HOSPITAL_COMMUNITY): Payer: 59

## 2016-09-22 ENCOUNTER — Encounter (HOSPITAL_COMMUNITY): Payer: 59

## 2016-09-24 ENCOUNTER — Encounter (HOSPITAL_COMMUNITY): Payer: 59

## 2016-09-24 ENCOUNTER — Other Ambulatory Visit: Payer: Self-pay | Admitting: Cardiology

## 2016-10-14 ENCOUNTER — Encounter: Payer: Self-pay | Admitting: Cardiology

## 2016-10-14 ENCOUNTER — Ambulatory Visit (INDEPENDENT_AMBULATORY_CARE_PROVIDER_SITE_OTHER): Payer: 59 | Admitting: Cardiology

## 2016-10-14 VITALS — BP 109/75 | HR 83 | Ht 72.0 in | Wt 327.0 lb

## 2016-10-14 DIAGNOSIS — G4733 Obstructive sleep apnea (adult) (pediatric): Secondary | ICD-10-CM | POA: Diagnosis not present

## 2016-10-14 DIAGNOSIS — I5022 Chronic systolic (congestive) heart failure: Secondary | ICD-10-CM | POA: Diagnosis not present

## 2016-10-14 DIAGNOSIS — I4891 Unspecified atrial fibrillation: Secondary | ICD-10-CM

## 2016-10-14 MED ORDER — SACUBITRIL-VALSARTAN 97-103 MG PO TABS: 1.0000 | ORAL_TABLET | Freq: Two times a day (BID) | ORAL | 6 refills | Status: DC
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
## 2016-10-14 NOTE — Patient Instructions (Signed)
Medication Instructions:   Increase Entresto to 97/103mg  twice a day   Continue all other medications.    Labwork:  BMET - order given today. - Due in 2 weeks.  Office will contact with results via phone or letter.    Testing/Procedures: none  Follow-Up: 3 months   Any Other Special Instructions Will Be Listed Below (If Applicable).  If you need a refill on your cardiac medications before your next appointment, please call your pharmacy.

## 2016-10-26 ENCOUNTER — Other Ambulatory Visit: Payer: Self-pay | Admitting: Cardiology

## 2016-10-29 DIAGNOSIS — I5040 Unspecified combined systolic (congestive) and diastolic (congestive) heart failure: Secondary | ICD-10-CM | POA: Diagnosis not present

## 2016-11-03 ENCOUNTER — Telehealth: Payer: Self-pay | Admitting: *Deleted

## 2016-11-03 NOTE — Telephone Encounter (Signed)
Pt aware - routed to pcp  

## 2016-11-03 NOTE — Telephone Encounter (Signed)
-----   Message from Antoine Poche, MD sent at 11/02/2016  2:43 PM EST ----- Labs look good  Dominga Ferry MD

## 2016-11-10 ENCOUNTER — Ambulatory Visit (INDEPENDENT_AMBULATORY_CARE_PROVIDER_SITE_OTHER): Payer: 59 | Admitting: *Deleted

## 2016-11-10 DIAGNOSIS — I428 Other cardiomyopathies: Secondary | ICD-10-CM

## 2016-11-10 DIAGNOSIS — I429 Cardiomyopathy, unspecified: Secondary | ICD-10-CM

## 2016-11-12 NOTE — Progress Notes (Signed)
Remote ICD transmission.   

## 2016-11-17 ENCOUNTER — Encounter: Payer: Self-pay | Admitting: Cardiology

## 2016-11-18 LAB — CUP PACEART REMOTE DEVICE CHECK
Battery Remaining Longevity: 67 mo
Battery Remaining Percentage: 68 %
Battery Voltage: 2.93 V
Brady Statistic RV Percent Paced: 94 %
Date Time Interrogation Session: 20180117112840
HighPow Impedance: 73 Ohm
HighPow Impedance: 73 Ohm
Implantable Lead Implant Date: 20160629
Implantable Lead Implant Date: 20160629
Implantable Lead Location: 753859
Implantable Lead Location: 753860
Implantable Lead Model: 7122
Implantable Pulse Generator Implant Date: 20160629
Lead Channel Impedance Value: 430 Ohm
Lead Channel Impedance Value: 450 Ohm
Lead Channel Pacing Threshold Amplitude: 0.625 V
Lead Channel Pacing Threshold Pulse Width: 0.5 ms
Lead Channel Sensing Intrinsic Amplitude: 0.2 mV
Lead Channel Sensing Intrinsic Amplitude: 7.1 mV
Lead Channel Setting Pacing Amplitude: 0.875
Lead Channel Setting Pacing Pulse Width: 0.5 ms
Lead Channel Setting Sensing Sensitivity: 0.5 mV
Pulse Gen Serial Number: 7136879

## 2016-12-02 DIAGNOSIS — H40013 Open angle with borderline findings, low risk, bilateral: Secondary | ICD-10-CM | POA: Diagnosis not present

## 2017-01-09 DIAGNOSIS — R55 Syncope and collapse: Secondary | ICD-10-CM | POA: Diagnosis not present

## 2017-01-09 DIAGNOSIS — R404 Transient alteration of awareness: Secondary | ICD-10-CM | POA: Diagnosis not present

## 2017-01-10 ENCOUNTER — Ambulatory Visit (INDEPENDENT_AMBULATORY_CARE_PROVIDER_SITE_OTHER): Payer: 59 | Admitting: Cardiology

## 2017-01-10 ENCOUNTER — Encounter: Payer: Self-pay | Admitting: Cardiology

## 2017-01-10 ENCOUNTER — Telehealth: Payer: Self-pay

## 2017-01-10 VITALS — BP 102/69 | HR 61 | Ht 72.0 in | Wt 334.4 lb

## 2017-01-10 DIAGNOSIS — I4891 Unspecified atrial fibrillation: Secondary | ICD-10-CM

## 2017-01-10 DIAGNOSIS — I472 Ventricular tachycardia, unspecified: Secondary | ICD-10-CM

## 2017-01-10 DIAGNOSIS — I5022 Chronic systolic (congestive) heart failure: Secondary | ICD-10-CM

## 2017-01-10 DIAGNOSIS — R55 Syncope and collapse: Secondary | ICD-10-CM

## 2017-01-10 NOTE — Telephone Encounter (Signed)
LVM for pt to call back to discuss episode from remote transmission.
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
## 2017-01-10 NOTE — Patient Instructions (Signed)
Your physician recommends that you schedule a follow-up appointment in: 2 MONTHS WITH DR [facility] AND 3 WEEKS WITH DR Ladona Ridgel  Your physician recommends that you continue on your current medications as directed. Please refer to the Current Medication list given to you today.  Your physician recommends that you return for lab work BMP/TSH/MG  Thank you for choosing [facility]!!

## 2017-01-13 ENCOUNTER — Ambulatory Visit: Payer: 59 | Admitting: Cardiology

## 2017-01-14 ENCOUNTER — Telehealth: Payer: Self-pay | Admitting: *Deleted

## 2017-01-14 NOTE — Telephone Encounter (Signed)
Pt aware - routed to pcp  

## 2017-01-14 NOTE — Telephone Encounter (Signed)
-----   Message from Antoine Poche, MD sent at 01/13/2017 10:47 PM EDT ----- Labs look good  Dominga Ferry MD

## 2017-01-19 ENCOUNTER — Encounter: Payer: Self-pay | Admitting: Internal Medicine

## 2017-01-26 ENCOUNTER — Other Ambulatory Visit: Payer: Self-pay | Admitting: Cardiology

## 2017-01-28 ENCOUNTER — Encounter: Payer: Self-pay | Admitting: Internal Medicine

## 2017-01-31 ENCOUNTER — Encounter: Payer: Self-pay | Admitting: Internal Medicine

## 2017-01-31 ENCOUNTER — Ambulatory Visit (INDEPENDENT_AMBULATORY_CARE_PROVIDER_SITE_OTHER): Payer: 59 | Admitting: Internal Medicine

## 2017-01-31 VITALS — BP 100/70 | HR 70 | Ht 72.0 in | Wt 329.4 lb

## 2017-01-31 DIAGNOSIS — I5022 Chronic systolic (congestive) heart failure: Secondary | ICD-10-CM

## 2017-01-31 DIAGNOSIS — Z95810 Presence of automatic (implantable) cardiac defibrillator: Secondary | ICD-10-CM | POA: Diagnosis not present

## 2017-01-31 DIAGNOSIS — I429 Cardiomyopathy, unspecified: Secondary | ICD-10-CM

## 2017-01-31 DIAGNOSIS — I428 Other cardiomyopathies: Secondary | ICD-10-CM

## 2017-01-31 LAB — CUP PACEART INCLINIC DEVICE CHECK
Brady Statistic RA Percent Paced: 0 %
Brady Statistic RV Percent Paced: 93 %
Date Time Interrogation Session: 20180409145348
HighPow Impedance: 67.5 Ohm
Implantable Lead Implant Date: 20160629
Implantable Lead Implant Date: 20160629
Implantable Lead Location: 753859
Implantable Lead Location: 753860
Implantable Lead Model: 7122
Implantable Pulse Generator Implant Date: 20160629
Lead Channel Impedance Value: 450 Ohm
Lead Channel Impedance Value: 462.5 Ohm
Lead Channel Pacing Threshold Amplitude: 0.75 V
Lead Channel Pacing Threshold Pulse Width: 0.5 ms
Lead Channel Sensing Intrinsic Amplitude: 0.4 mV
Lead Channel Sensing Intrinsic Amplitude: 7.2 mV
Lead Channel Setting Pacing Amplitude: 0.875
Lead Channel Setting Pacing Pulse Width: 0.5 ms
Lead Channel Setting Sensing Sensitivity: 0.5 mV
Pulse Gen Serial Number: 7136879

## 2017-01-31 NOTE — Patient Instructions (Addendum)
Medication Instructions:  Your physician recommends that you continue on your current medications as directed. Please refer to the Current Medication list given to you today.   Labwork: TODAY - CBC, BMET   Testing/Procedures: You are scheduled for BiV ICD Upgrade on Monday April 16 with Dr. Ladona Ridgel Please arrive at [facility] entrance (Entrance A) at 5:30 am and go directly to Admitting Do not eat or drink anything after midnight Sunday night Hold Xarelto after your dose on Saturday night You may take your other medications Monday morning with a sip of water Use scrub according to directions on Sunday night Plan for 1 night stay - you may keep your overnight bag in the car You will need someone to drive you home the next day Wear comfortable clothes that are easy to get on and off   Follow-Up: Your physician recommends that you schedule a follow-up appointment in: 10-14 days after Monday April 16 for wound check  Your physician recommends that you schedule a follow-up appointment in: 3 months after April 16 with Dr. Ladona Ridgel   If you need a refill on your cardiac medications before your next appointment, please call your pharmacy.   Thank you for choosing [facility]! Eligha Bridegroom, RN [phone]
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
## 2017-02-01 LAB — BASIC METABOLIC PANEL
BUN/Creatinine Ratio: 13 (ref 9–20)
BUN: 11 mg/dL (ref 6–24)
CO2: 30 mmol/L — ABNORMAL HIGH (ref 18–29)
Calcium: 9.5 mg/dL (ref 8.7–10.2)
Chloride: 98 mmol/L (ref 96–106)
Creatinine, Ser: 0.86 mg/dL (ref 0.76–1.27)
GFR calc Af Amer: 118 mL/min/{1.73_m2} (ref >59)
GFR calc non Af Amer: 102 mL/min/{1.73_m2} (ref >59)
Glucose: 88 mg/dL (ref 65–99)
Potassium: 5.1 mmol/L (ref 3.5–5.2)
Sodium: 139 mmol/L (ref 134–144)

## 2017-02-01 LAB — CBC
Hematocrit: 46 % (ref 37.5–51.0)
Hemoglobin: 15.2 g/dL (ref 13.0–17.7)
MCH: 29.2 pg (ref 26.6–33.0)
MCHC: 33 g/dL (ref 31.5–35.7)
MCV: 88 fL (ref 79–97)
Platelets: 164 10*3/uL (ref 150–379)
RBC: 5.21 x10E6/uL (ref 4.14–5.80)
RDW: 13 % (ref 12.3–15.4)
WBC: 6.3 10*3/uL (ref 3.4–10.8)

## 2017-02-07 ENCOUNTER — Ambulatory Visit (HOSPITAL_COMMUNITY)
Admission: RE | Admit: 2017-02-07 | Discharge: 2017-02-08 | Disposition: A | Discharge status: Home / Self Care | Payer: 59 | Source: Ambulatory Visit | Attending: Internal Medicine | Admitting: Internal Medicine

## 2017-02-07 ENCOUNTER — Encounter (HOSPITAL_COMMUNITY): Payer: Self-pay | Admitting: Internal Medicine

## 2017-02-07 ENCOUNTER — Ambulatory Visit (HOSPITAL_COMMUNITY)
Admission: RE | Disposition: A | Discharge status: Home / Self Care | Payer: Self-pay | Source: Ambulatory Visit | Attending: Internal Medicine

## 2017-02-07 DIAGNOSIS — Z4502 Encounter for adjustment and management of automatic implantable cardiac defibrillator: Secondary | ICD-10-CM | POA: Diagnosis not present

## 2017-02-07 DIAGNOSIS — I420 Dilated cardiomyopathy: Secondary | ICD-10-CM | POA: Diagnosis not present

## 2017-02-07 DIAGNOSIS — I5022 Chronic systolic (congestive) heart failure: Secondary | ICD-10-CM | POA: Diagnosis not present

## 2017-02-07 DIAGNOSIS — I447 Left bundle-branch block, unspecified: Secondary | ICD-10-CM | POA: Insufficient documentation

## 2017-02-07 DIAGNOSIS — I472 Ventricular tachycardia: Secondary | ICD-10-CM | POA: Insufficient documentation

## 2017-02-07 DIAGNOSIS — I442 Atrioventricular block, complete: Secondary | ICD-10-CM | POA: Diagnosis not present

## 2017-02-07 DIAGNOSIS — G4733 Obstructive sleep apnea (adult) (pediatric): Secondary | ICD-10-CM | POA: Insufficient documentation

## 2017-02-07 DIAGNOSIS — Z7901 Long term (current) use of anticoagulants: Secondary | ICD-10-CM | POA: Insufficient documentation

## 2017-02-07 DIAGNOSIS — I481 Persistent atrial fibrillation: Secondary | ICD-10-CM | POA: Insufficient documentation

## 2017-02-07 DIAGNOSIS — I110 Hypertensive heart disease with heart failure: Secondary | ICD-10-CM | POA: Diagnosis not present

## 2017-02-07 DIAGNOSIS — Z6841 Body Mass Index (BMI) 40.0 and over, adult: Secondary | ICD-10-CM | POA: Insufficient documentation

## 2017-02-07 DIAGNOSIS — Z95810 Presence of automatic (implantable) cardiac defibrillator: Secondary | ICD-10-CM

## 2017-02-07 HISTORY — PX: BIV UPGRADE: EP1202

## 2017-02-07 LAB — SURGICAL PCR SCREEN
MRSA, PCR: NEGATIVE
Staphylococcus aureus: NEGATIVE

## 2017-02-07 SURGERY — BIV UPGRADE

## 2017-02-07 MED ORDER — MUPIROCIN 2 % EX OINT: 1.0000 "application " | TOPICAL_OINTMENT | Freq: Once | CUTANEOUS | Status: DC

## 2017-02-07 MED ORDER — MIDAZOLAM HCL 5 MG/5ML IJ SOLN
INTRAMUSCULAR | Status: AC
  Filled 2017-02-07: qty 5

## 2017-02-07 MED ORDER — ONDANSETRON HCL 4 MG/2ML IJ SOLN: 4.0000 mg | Freq: Four times a day (QID) | INTRAMUSCULAR | Status: DC | PRN

## 2017-02-07 MED ORDER — HEPARIN (PORCINE) IN NACL 2-0.9 UNIT/ML-% IJ SOLN
INTRAMUSCULAR | Status: DC | PRN
  Administered 2017-02-07: 1000 mL

## 2017-02-07 MED ORDER — MUPIROCIN 2 % EX OINT
TOPICAL_OINTMENT | CUTANEOUS | Status: AC
  Administered 2017-02-07: 07:00:00
  Filled 2017-02-07: qty 22

## 2017-02-07 MED ORDER — SODIUM CHLORIDE 0.9 % IV SOLN
INTRAVENOUS | Status: DC
  Administered 2017-02-07: 07:00:00 via INTRAVENOUS

## 2017-02-07 MED ORDER — SODIUM CHLORIDE 0.9 % IR SOLN
80.0000 mg | Status: AC
  Administered 2017-02-07: 80 mg

## 2017-02-07 MED ORDER — FENTANYL CITRATE (PF) 100 MCG/2ML IJ SOLN: 25.0000 ug | INTRAMUSCULAR | Status: DC | PRN

## 2017-02-07 MED ORDER — HEPARIN (PORCINE) IN NACL 2-0.9 UNIT/ML-% IJ SOLN
INTRAMUSCULAR | Status: AC
  Filled 2017-02-07: qty 500

## 2017-02-07 MED ORDER — LIDOCAINE HCL (PF) 1 % IJ SOLN
INTRAMUSCULAR | Status: AC
  Filled 2017-02-07: qty 60

## 2017-02-07 MED ORDER — CEFAZOLIN SODIUM-DEXTROSE 2-4 GM/100ML-% IV SOLN
2.0000 g | Freq: Four times a day (QID) | INTRAVENOUS | Status: AC
  Administered 2017-02-07 (×3): 2 g via INTRAVENOUS
  Filled 2017-02-07 (×3): qty 100

## 2017-02-07 MED ORDER — ACETAMINOPHEN 325 MG PO TABS
325.0000 mg | ORAL_TABLET | ORAL | Status: DC | PRN
  Administered 2017-02-07: 650 mg via ORAL
  Filled 2017-02-07: qty 2

## 2017-02-07 MED ORDER — SODIUM CHLORIDE 0.9 % IR SOLN
Status: AC
  Filled 2017-02-07: qty 2

## 2017-02-07 MED ORDER — IOPAMIDOL (ISOVUE-370) INJECTION 76%
INTRAVENOUS | Status: AC
  Filled 2017-02-07: qty 50

## 2017-02-07 MED ORDER — DEXTROSE 5 % IV SOLN
3.0000 g | INTRAVENOUS | Status: AC
  Administered 2017-02-07: 3 g via INTRAVENOUS
  Filled 2017-02-07: qty 3000

## 2017-02-07 MED ORDER — FENTANYL CITRATE (PF) 100 MCG/2ML IJ SOLN
INTRAMUSCULAR | Status: DC | PRN
  Administered 2017-02-07 (×3): 12.5 ug via INTRAVENOUS
  Administered 2017-02-07: 25 ug via INTRAVENOUS

## 2017-02-07 MED ORDER — SACUBITRIL-VALSARTAN 97-103 MG PO TABS
1.0000 | ORAL_TABLET | Freq: Two times a day (BID) | ORAL | Status: DC
  Administered 2017-02-07 – 2017-02-08 (×2): 1 via ORAL
  Filled 2017-02-07 (×3): qty 1

## 2017-02-07 MED ORDER — CHLORHEXIDINE GLUCONATE 4 % EX LIQD
60.0000 mL | Freq: Once | CUTANEOUS | Status: DC
  Filled 2017-02-07: qty 60

## 2017-02-07 MED ORDER — FENTANYL CITRATE (PF) 100 MCG/2ML IJ SOLN
INTRAMUSCULAR | Status: AC
  Filled 2017-02-07: qty 2

## 2017-02-07 MED ORDER — FUROSEMIDE 40 MG PO TABS: 40.0000 mg | ORAL_TABLET | Freq: Every day | ORAL | Status: DC | PRN

## 2017-02-07 MED ORDER — ACETAMINOPHEN 500 MG PO TABS: 1000.0000 mg | ORAL_TABLET | Freq: Every day | ORAL | Status: DC | PRN

## 2017-02-07 MED ORDER — METOPROLOL SUCCINATE ER 25 MG PO TB24
25.0000 mg | ORAL_TABLET | Freq: Two times a day (BID) | ORAL | Status: DC
  Administered 2017-02-07 – 2017-02-08 (×2): 25 mg via ORAL
  Filled 2017-02-07 (×2): qty 1

## 2017-02-07 MED ORDER — IOPAMIDOL (ISOVUE-370) INJECTION 76%
INTRAVENOUS | Status: DC | PRN
  Administered 2017-02-07: 37 mL

## 2017-02-07 MED ORDER — MIDAZOLAM HCL 5 MG/5ML IJ SOLN
INTRAMUSCULAR | Status: DC | PRN
Start: 2017-02-07 — End: 2017-02-07
  Administered 2017-02-07 (×13): 1 mg via INTRAVENOUS

## 2017-02-07 MED ORDER — LIDOCAINE HCL (PF) 1 % IJ SOLN
INTRAMUSCULAR | Status: DC | PRN
  Administered 2017-02-07: 44 mL

## 2017-02-07 SURGICAL SUPPLY — 19 items
ASSURA CRTD CD3369-40C (ICD Generator) ×2 IMPLANT
CABLE SURGICAL S-101-97-12 (CABLE) ×2 IMPLANT
CATH ACUITYPRO 45CM W 9F (CATHETERS) ×2 IMPLANT
CATH ATTAIN COM SUR 6250V-MB2X (CATHETERS) ×2 IMPLANT
CATH ATTAIN COM SURV 6250V-EH (CATHETERS) ×2 IMPLANT
CATH ATTAIN COM SURV 6250V-MB2 (CATHETERS) ×2 IMPLANT
CATH CPS DIRECT 135 DS2C020 (CATHETERS) ×2 IMPLANT
CATH HEX JOSEPH 2-5-2 65CM 6F (CATHETERS) ×2 IMPLANT
CPS IMPLANT KIT 410190 (MISCELLANEOUS) ×2 IMPLANT
DEFIB ASSURA CRT-D (ICD Generator) ×1 IMPLANT
HOVERMATT SINGLE USE (MISCELLANEOUS) ×2 IMPLANT
KIT ESSENTIALS PG (KITS) ×2 IMPLANT
LEAD QUARTET 1458Q-86CM (Lead) ×2 IMPLANT
PAD DEFIB LIFELINK (PAD) ×2 IMPLANT
SHEATH CLASSIC 9.5F (SHEATH) ×2 IMPLANT
TRAY PACEMAKER INSERTION (PACKS) ×2 IMPLANT
WIRE ACUITY WHISPER EDS 4648 (WIRE) ×2 IMPLANT
WIRE HI TORQ VERSACORE-J 145CM (WIRE) ×2 IMPLANT
WIRE MAILMAN 182CM (WIRE) ×2 IMPLANT

## 2017-02-07 NOTE — Discharge Instructions (Signed)
° ° °  Supplemental Discharge Instructions for  Pacemaker/Defibrillator Patients  Activity No heavy lifting or vigorous activity with your left/right arm for 6 to 8 weeks.  Do not raise your left/right arm above your head for one week.  Gradually raise your affected arm as drawn below.             02/11/17                     02/12/17                     02/13/17                   02/14/17 __  NO DRIVING for  1 week   ; you may begin driving on  11/26/52   .  WOUND CARE - Keep the wound area clean and dry.  Do not get this area wet for one week. No showers for one week; you may shower on 02/14/17  . - The tape/steri-strips on your wound will fall off; do not pull them off.  No bandage is needed on the site.  DO  NOT apply any creams, oils, or ointments to the wound area. - If you notice any drainage or discharge from the wound, any swelling or bruising at the site, or you develop a fever > 101? F after you are discharged home, call the office at once.  Special Instructions - You are still able to use cellular telephones; use the ear opposite the side where you have your pacemaker/defibrillator.  Avoid carrying your cellular phone near your device. - When traveling through airports, show security personnel your identification card to avoid being screened in the metal detectors.  Ask the security personnel to use the hand wand. - Avoid arc welding equipment, MRI testing (magnetic resonance imaging), TENS units (transcutaneous nerve stimulators).  Call the office for questions about other devices. - Avoid electrical appliances that are in poor condition or are not properly grounded. - Microwave ovens are safe to be near or to operate.  Additional information for defibrillator patients should your device go off: - If your device goes off ONCE and you feel fine afterward, notify the device clinic nurses. - If your device goes off ONCE and you do not feel well afterward, call 911. - If your device goes  off TWICE, call 911. - If your device goes off THREE times in one day, call 911.  DO NOT DRIVE YOURSELF OR A FAMILY MEMBER WITH A DEFIBRILLATOR TO THE HOSPITAL--CALL 911.

## 2017-02-07 NOTE — Interval H&P Note (Signed)
History and Physical Interval Note:  02/07/2017 7:03 AM  Brandon Torres  has presented today for surgery, with the diagnosis of hf - syncope  The various methods of treatment have been discussed with the patient and family. After consideration of risks, benefits and other options for treatment, the patient has consented to  Procedure(s): BiV Upgrade (N/A) as a surgical intervention .  The patient's history has been reviewed, patient examined, no change in status, stable for surgery.  I have reviewed the patient's chart and labs.  Questions were answered to the patient's satisfaction.     Lewayne Bunting

## 2017-02-07 NOTE — Discharge Summary (Signed)
ELECTROPHYSIOLOGY PROCEDURE DISCHARGE SUMMARY    Patient ID: Brandon Torres,  MRN: [id number], DOB/AGE: 49-Jan-1967 49 y.o.  Admit date: 02/07/2017 Discharge date: 02/08/17  Primary Care Physician: Fredirick Maudlin, MD  Primary Cardiologist: Dr. Wyline Mood Electrophysiologist: Dr. Ladona Ridgel  Primary Discharge Diagnosis:  1. DCM 2. LBBB 3. CHB  Secondary Discharge Diagnosis:  1. Chronic CHF (systolic) 2. OSA 3. Persistent AFib     CHA2DS2Vasc is at least 1, on Xarelto 4. VT  No Known Allergies   Procedures This Admission:  1.  Upgrade of a dual chamber ICD to a CRT-D on 02/07/17 by Dr Ladona Ridgel.  See procedure report for details. DFT's were deferred at time of implant.  There were no immediate post procedure complications. 2.  CXR on 02/08/17 demonstrated no pneumothorax status post device implantation.   Brief HPI: Brandon Torres is a 51 y.o. male was referred to electrophysiology in the outpatient setting for consideration of ICD implantation.  Past medical history includes DCM with ICD, worsening conduction system disease to CHB with LBBB, given this was decided to upgrade his ICD to a CRT device, as well as persistent AFib.  Risks, benefits, and alternatives to ICD implantation were reviewed with the patient who wished to proceed.   Hospital Course:  The patient was admitted and underwent upgrade of his ICD to a CRT device with details as outlined in his procedure report. He was monitored on telemetry overnight which demonstrated V paced rhythm.  Left chest was without hematoma or ecchymosis.  The device was interrogated and found to be functioning normally.  CXR was obtained and demonstrated no pneumothorax status post device implantation.  Wound care, arm mobility, and restrictions were reviewed with the patient.  The patient was examined by Dr. Ladona Ridgel and considered stable for discharge to home.   The patient's discharge medications include an ARB (Entresto) and beta blocker  (metoprolol).   Physical Exam: Vitals:   02/07/17 1010 02/07/17 1043 02/07/17 1954 02/08/17 0348  BP: 97/61 98/60 92/62  (!) 90/48  Pulse: 79 60 62 67  Resp: 12  18 18   Temp:   98.1 F (36.7 C) 98 F (36.7 C)  TempSrc:   Oral Oral  SpO2: 97% 97% 99% 100%  Weight:      Height:        GEN- The patient is well appearing, alert and oriented x 3 today.   HEENT: normocephalic, atraumatic; sclera clear, conjunctiva pink; hearing intact; oropharynx clear Lungs- CTA b/l, normal work of breathing.  No wheezes, rales, rhonchi Heart- RRR, no murmurs, rubs or gallops, PMI not laterally displaced GI- soft, non-tender, non-distended, bowel sounds present Extremities- no clubbing, cyanosis, or edema MS- no significant deformity or atrophy Skin- warm and dry, no rash or lesion, left chest without hematoma/ecchymosis Psych- euthymic mood, full affect Neuro- no gross defecits  Labs:   Lab Results  Component Value Date   WBC 6.3 01/31/2017   HGB 15.8 07/27/2016   HCT 46.0 01/31/2017   MCV 88 01/31/2017   PLT 164 01/31/2017   No results for input(s): NA, K, CL, CO2, BUN, CREATININE, CALCIUM, PROT, BILITOT, ALKPHOS, ALT, AST, GLUCOSE in the last 168 hours.  Invalid input(s): LABALBU  Discharge Medications:  Allergies as of 02/08/2017   No Known Allergies     Medication List    TAKE these medications   acetaminophen 500 MG tablet Commonly known as:  TYLENOL Take 1,000 mg by mouth daily as needed for mild pain or headache.  furosemide 40 MG tablet Commonly known as:  LASIX Take 40 mg by mouth daily as needed (swelling).   metoprolol succinate 100 MG 24 hr tablet Commonly known as:  TOPROL-XL TAKE (1) TABLET BY MOUTH TWICE DAILY. TAKE WITH OR IMMEDIATELY FOLLOWING A MEAL.   sacubitril-valsartan 97-103 MG Commonly known as:  ENTRESTO Take 1 tablet by mouth 2 (two) times daily.   XARELTO 20 MG Tabs tablet Generic drug:  rivaroxaban TAKE ONE TABLET BY MOUTH EVERY DAY WITH  DINNER. Notes to patient:  Do not take 02/08/17-02/09/17       Disposition:  home Discharge Instructions    Diet - low sodium heart healthy    Complete by:  As directed    Increase activity slowly    Complete by:  As directed      Follow-up Information    [facility] Follow up on 02/17/2017.   Specialty:  Cardiology Why:  11:00AM, wound check Contact information: [address] [place] [address] [phone]       Lewayne Bunting, MD Follow up on 05/09/2017.   Specialty:  Cardiology Why:  9:30AM Contact information: [address] [place] [address] [phone]           Duration of Discharge Encounter: Greater than 30 minutes including physician time.  Norma Fredrickson, PA-C 02/08/2017 9:43 AM  EP Attending  Patient seen and examined. His St. Jude device has been interrogated and is working normally. His CXR looks good. Will plan to DC home with usual followup.  Leonia Reeves.D.
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
## 2017-02-08 ENCOUNTER — Ambulatory Visit (HOSPITAL_COMMUNITY): Payer: 59

## 2017-02-08 DIAGNOSIS — Z95810 Presence of automatic (implantable) cardiac defibrillator: Secondary | ICD-10-CM | POA: Diagnosis not present

## 2017-02-08 DIAGNOSIS — Z4502 Encounter for adjustment and management of automatic implantable cardiac defibrillator: Secondary | ICD-10-CM | POA: Diagnosis not present

## 2017-02-08 DIAGNOSIS — I110 Hypertensive heart disease with heart failure: Secondary | ICD-10-CM | POA: Diagnosis not present

## 2017-02-08 DIAGNOSIS — I5022 Chronic systolic (congestive) heart failure: Secondary | ICD-10-CM | POA: Diagnosis not present

## 2017-02-08 MED FILL — Gentamicin Sulfate Inj 40 MG/ML: INTRAMUSCULAR | Qty: 2 | Status: AC

## 2017-02-08 MED FILL — Sodium Chloride Irrigation Soln 0.9%: Qty: 500 | Status: AC

## 2017-02-08 NOTE — Progress Notes (Signed)
ICD Criteria  Current LVEF:25%. Within 12 months prior to implant: No   Heart failure history: Yes, Class III  Cardiomyopathy history: Yes, Non-Ischemic Cardiomyopathy.  Atrial Fibrillation/Atrial Flutter: Yes, Long standing (> 1 Year).  Ventricular tachycardia history: Yes, Hemodynamic instability present. VT Type: Sustained Ventricular Tachycardia - Monomorphic.  Cardiac arrest history: No.  History of syndromes with risk of sudden death: No.  Previous ICD: Yes, Reason for ICD:  Primary prevention.  Current ICD indication: Secondary  PPM indication: Yes. Pacing type: Ventricular. Greater than 40% RV pacing requirement anticipated. Indication: Complete Heart Block   Class I or II Bradycardia indication present: Yes  Beta Blocker therapy for 3 or more months: Yes, prescribed.   Ace Inhibitor/ARB therapy for 3 or more months: Yes, prescribed.   Patient ID: Brandon Torres, male   DOB: Mar 10, 1966, 51 y.o.   MRN: [id number]

## 2017-02-08 NOTE — Progress Notes (Signed)
Patient discharged per MD order. Iv and telemetry removed. AVS completed with patient and spouse. Instructions regarding bathing and wound care went over with teach back. No further questions at this time.  Minerva Ends RN

## 2017-02-11 ENCOUNTER — Other Ambulatory Visit: Payer: Self-pay | Admitting: Internal Medicine

## 2017-02-17 ENCOUNTER — Ambulatory Visit (INDEPENDENT_AMBULATORY_CARE_PROVIDER_SITE_OTHER): Payer: 59 | Admitting: *Deleted

## 2017-02-17 DIAGNOSIS — I428 Other cardiomyopathies: Secondary | ICD-10-CM

## 2017-02-17 DIAGNOSIS — I429 Cardiomyopathy, unspecified: Secondary | ICD-10-CM

## 2017-02-17 DIAGNOSIS — I5022 Chronic systolic (congestive) heart failure: Secondary | ICD-10-CM

## 2017-02-17 DIAGNOSIS — I420 Dilated cardiomyopathy: Secondary | ICD-10-CM

## 2017-02-17 NOTE — Progress Notes (Signed)
Wound check appointment. Steri-strips removed. Wound without redness or edema. Incision edges approximated, wound well healed. Normal device function. Thresholds, sensing, and impedances consistent with implant measurements. RV programmed with Cap Confirm at implant. LV programmed at 3.0V unitl 3 months visit. Histogram distribution appropriate for patient and level of activity. Bi-VP 94%.  99% AT/AF + Xarelto. No ventricular arrhythmias noted. Patient educated about wound care, arm mobility, lifting restrictions, shock plan. ROV in 3 05/09/17 w/ GT.

## 2017-02-24 ENCOUNTER — Other Ambulatory Visit: Payer: Self-pay | Admitting: Cardiology

## 2017-03-07 DIAGNOSIS — M5442 Lumbago with sciatica, left side: Secondary | ICD-10-CM | POA: Diagnosis not present

## 2017-03-07 DIAGNOSIS — M47816 Spondylosis without myelopathy or radiculopathy, lumbar region: Secondary | ICD-10-CM | POA: Diagnosis not present

## 2017-03-10 DIAGNOSIS — M5442 Lumbago with sciatica, left side: Secondary | ICD-10-CM | POA: Diagnosis not present

## 2017-03-10 DIAGNOSIS — M47816 Spondylosis without myelopathy or radiculopathy, lumbar region: Secondary | ICD-10-CM | POA: Diagnosis not present

## 2017-03-14 DIAGNOSIS — M47816 Spondylosis without myelopathy or radiculopathy, lumbar region: Secondary | ICD-10-CM | POA: Diagnosis not present

## 2017-03-14 DIAGNOSIS — M5442 Lumbago with sciatica, left side: Secondary | ICD-10-CM | POA: Diagnosis not present

## 2017-03-16 ENCOUNTER — Encounter: Payer: Self-pay | Admitting: Cardiology

## 2017-03-16 ENCOUNTER — Ambulatory Visit (INDEPENDENT_AMBULATORY_CARE_PROVIDER_SITE_OTHER): Payer: 59 | Admitting: Cardiology

## 2017-03-16 VITALS — BP 101/70 | HR 86 | Ht 72.0 in | Wt 328.0 lb

## 2017-03-16 DIAGNOSIS — I5022 Chronic systolic (congestive) heart failure: Secondary | ICD-10-CM

## 2017-03-16 DIAGNOSIS — I4891 Unspecified atrial fibrillation: Secondary | ICD-10-CM

## 2017-03-16 DIAGNOSIS — I472 Ventricular tachycardia, unspecified: Secondary | ICD-10-CM

## 2017-03-16 MED ORDER — SPIRONOLACTONE 25 MG PO TABS: 12.5000 mg | ORAL_TABLET | Freq: Every day | ORAL | 6 refills | Status: DC

## 2017-03-16 NOTE — Patient Instructions (Addendum)
Medication Instructions:   Begin Aldactone 12.5mg  daily.  Continue all other medications.    Labwork:  BMET - order given today.    Due in 2 weeks.  Office will contact with results via phone or letter.    Testing/Procedures: none  Follow-Up: Your physician wants you to follow up in:  4 months.  You will receive a reminder letter in the mail one-two months in advance.  If you don't receive a letter, please call our office to schedule the follow up appointment   Any Other Special Instructions Will Be Listed Below (If Applicable).  If you need a refill on your cardiac medications before your next appointment, please call your pharmacy.
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
## 2017-03-17 DIAGNOSIS — M5442 Lumbago with sciatica, left side: Secondary | ICD-10-CM | POA: Diagnosis not present

## 2017-03-17 DIAGNOSIS — M47816 Spondylosis without myelopathy or radiculopathy, lumbar region: Secondary | ICD-10-CM | POA: Diagnosis not present

## 2017-03-23 DIAGNOSIS — M47816 Spondylosis without myelopathy or radiculopathy, lumbar region: Secondary | ICD-10-CM | POA: Diagnosis not present

## 2017-03-23 DIAGNOSIS — M5442 Lumbago with sciatica, left side: Secondary | ICD-10-CM | POA: Diagnosis not present

## 2017-03-28 DIAGNOSIS — I420 Dilated cardiomyopathy: Secondary | ICD-10-CM | POA: Diagnosis not present

## 2017-03-28 DIAGNOSIS — I472 Ventricular tachycardia: Secondary | ICD-10-CM | POA: Diagnosis not present

## 2017-03-28 DIAGNOSIS — G4733 Obstructive sleep apnea (adult) (pediatric): Secondary | ICD-10-CM | POA: Diagnosis not present

## 2017-03-29 ENCOUNTER — Other Ambulatory Visit: Payer: Self-pay | Admitting: Cardiology

## 2017-03-30 ENCOUNTER — Other Ambulatory Visit: Payer: Self-pay | Admitting: Cardiology

## 2017-03-30 DIAGNOSIS — M5442 Lumbago with sciatica, left side: Secondary | ICD-10-CM | POA: Diagnosis not present

## 2017-03-30 DIAGNOSIS — I429 Cardiomyopathy, unspecified: Secondary | ICD-10-CM | POA: Diagnosis not present

## 2017-03-30 DIAGNOSIS — G4733 Obstructive sleep apnea (adult) (pediatric): Secondary | ICD-10-CM | POA: Diagnosis not present

## 2017-03-30 DIAGNOSIS — I5040 Unspecified combined systolic (congestive) and diastolic (congestive) heart failure: Secondary | ICD-10-CM | POA: Diagnosis not present

## 2017-03-30 DIAGNOSIS — I472 Ventricular tachycardia: Secondary | ICD-10-CM | POA: Diagnosis not present

## 2017-03-30 DIAGNOSIS — M47816 Spondylosis without myelopathy or radiculopathy, lumbar region: Secondary | ICD-10-CM | POA: Diagnosis not present

## 2017-03-30 LAB — PSA: PSA: 0.5

## 2017-03-31 LAB — BASIC METABOLIC PANEL
BUN/Creatinine Ratio: 14 (ref 9–20)
BUN: 14 mg/dL (ref 6–24)
CO2: 24 mmol/L (ref 18–29)
Calcium: 9.3 mg/dL (ref 8.7–10.2)
Chloride: 102 mmol/L (ref 96–106)
Creatinine, Ser: 0.98 mg/dL (ref 0.76–1.27)
GFR calc Af Amer: 104 mL/min/{1.73_m2} (ref >59)
GFR calc non Af Amer: 90 mL/min/{1.73_m2} (ref >59)
Glucose: 105 mg/dL — ABNORMAL HIGH (ref 65–99)
Potassium: 4.7 mmol/L (ref 3.5–5.2)
Sodium: 140 mmol/L (ref 134–144)

## 2017-04-04 ENCOUNTER — Telehealth: Payer: Self-pay | Admitting: *Deleted

## 2017-04-04 NOTE — Telephone Encounter (Signed)
-----   Message from Antoine Poche, MD sent at 04/01/2017  4:00 PM EDT ----- Labs look good, continue current meds  Dominga Ferry MD

## 2017-04-04 NOTE — Telephone Encounter (Signed)
Pt aware - routed to pcp  

## 2017-04-15 ENCOUNTER — Other Ambulatory Visit: Payer: Self-pay | Admitting: Cardiology

## 2017-04-20 DIAGNOSIS — M5442 Lumbago with sciatica, left side: Secondary | ICD-10-CM | POA: Diagnosis not present

## 2017-04-20 DIAGNOSIS — M47816 Spondylosis without myelopathy or radiculopathy, lumbar region: Secondary | ICD-10-CM | POA: Diagnosis not present

## 2017-04-28 ENCOUNTER — Other Ambulatory Visit: Payer: Self-pay | Admitting: Cardiology

## 2017-05-09 ENCOUNTER — Encounter: Payer: Self-pay | Admitting: Internal Medicine

## 2017-05-09 ENCOUNTER — Ambulatory Visit (INDEPENDENT_AMBULATORY_CARE_PROVIDER_SITE_OTHER): Payer: 59 | Admitting: Internal Medicine

## 2017-05-09 VITALS — BP 96/72 | HR 76 | Ht 72.0 in | Wt 330.6 lb

## 2017-05-09 DIAGNOSIS — Z95810 Presence of automatic (implantable) cardiac defibrillator: Secondary | ICD-10-CM

## 2017-05-09 DIAGNOSIS — I5022 Chronic systolic (congestive) heart failure: Secondary | ICD-10-CM | POA: Diagnosis not present

## 2017-05-09 DIAGNOSIS — I428 Other cardiomyopathies: Secondary | ICD-10-CM

## 2017-05-09 DIAGNOSIS — I429 Cardiomyopathy, unspecified: Secondary | ICD-10-CM
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
## 2017-05-09 NOTE — Patient Instructions (Addendum)
Medication Instructions:  Your physician recommends that you continue on your current medications as directed. Please refer to the Current Medication list given to you today.   Labwork: None Ordered   Testing/Procedures: None Ordered   Follow-Up: Your physician wants you to follow-up in: 9 months with Dr. Ladona Ridgel. You will receive a reminder letter in the mail two months in advance. If you don't receive a letter, please call our office to schedule the follow-up appointment.  Remote monitoring is used to monitor your ICD from home. This monitoring reduces the number of office visits required to check your device to one time per year. It allows [name] to keep an eye on the functioning of your device to ensure it is working properly. You are scheduled for a device check from home on  08/11/17 . You may send your transmission at any time that day. If you have a wireless device, the transmission will be sent automatically. After your physician reviews your transmission, you will receive a postcard with your next transmission date.    Any Other Special Instructions Will Be Listed Below (If Applicable).     If you need a refill on your cardiac medications before your next appointment, please call your pharmacy.

## 2017-05-29 ENCOUNTER — Other Ambulatory Visit: Payer: Self-pay | Admitting: Cardiology

## 2017-06-01 ENCOUNTER — Other Ambulatory Visit: Payer: Self-pay | Admitting: Cardiology

## 2017-07-12 ENCOUNTER — Ambulatory Visit (INDEPENDENT_AMBULATORY_CARE_PROVIDER_SITE_OTHER): Payer: 59 | Admitting: Cardiology

## 2017-07-12 ENCOUNTER — Encounter: Payer: Self-pay | Admitting: *Deleted

## 2017-07-12 ENCOUNTER — Encounter: Payer: Self-pay | Admitting: Cardiology

## 2017-07-12 VITALS — BP 122/60 | HR 76 | Ht 72.0 in | Wt 331.0 lb

## 2017-07-12 DIAGNOSIS — Z23 Encounter for immunization: Secondary | ICD-10-CM

## 2017-07-12 DIAGNOSIS — R55 Syncope and collapse: Secondary | ICD-10-CM | POA: Diagnosis not present

## 2017-07-12 DIAGNOSIS — I472 Ventricular tachycardia, unspecified: Secondary | ICD-10-CM

## 2017-07-12 DIAGNOSIS — I5022 Chronic systolic (congestive) heart failure: Secondary | ICD-10-CM | POA: Diagnosis not present

## 2017-07-12 DIAGNOSIS — I4891 Unspecified atrial fibrillation: Secondary | ICD-10-CM | POA: Diagnosis not present
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
## 2017-07-12 NOTE — Patient Instructions (Signed)
Your physician wants you to follow-up in: 4 MONTHS WITH DR BRANCH You will receive a reminder letter in the mail two months in advance. If you don't receive a letter, please call our office to schedule the follow-up appointment.  Your physician recommends that you continue on your current medications as directed. Please refer to the Current Medication list given to you today.  Thank you for choosing [facility]!!   

## 2017-07-14 ENCOUNTER — Other Ambulatory Visit: Payer: Self-pay | Admitting: Cardiology

## 2017-08-08 ENCOUNTER — Ambulatory Visit (INDEPENDENT_AMBULATORY_CARE_PROVIDER_SITE_OTHER): Payer: 59 | Admitting: *Deleted

## 2017-08-08 DIAGNOSIS — I428 Other cardiomyopathies: Secondary | ICD-10-CM | POA: Diagnosis not present

## 2017-08-08 DIAGNOSIS — I429 Cardiomyopathy, unspecified: Secondary | ICD-10-CM

## 2017-08-09 NOTE — Progress Notes (Signed)
Remote ICD transmission.   

## 2017-08-10 LAB — CUP PACEART REMOTE DEVICE CHECK
Battery Remaining Longevity: 68 mo
Battery Remaining Percentage: 91 %
Battery Voltage: 3.11 V
Date Time Interrogation Session: 20181015113520
HighPow Impedance: 73 Ohm
HighPow Impedance: 73 Ohm
Implantable Lead Implant Date: 20160629
Implantable Lead Implant Date: 20160629
Implantable Lead Implant Date: 20180416
Implantable Lead Location: 753858
Implantable Lead Location: 753859
Implantable Lead Location: 753860
Implantable Lead Model: 7122
Implantable Pulse Generator Implant Date: 20180416
Lead Channel Impedance Value: 400 Ohm
Lead Channel Impedance Value: 480 Ohm
Lead Channel Impedance Value: 580 Ohm
Lead Channel Pacing Threshold Amplitude: 0.5 V
Lead Channel Pacing Threshold Amplitude: 0.75 V
Lead Channel Pacing Threshold Pulse Width: 0.5 ms
Lead Channel Pacing Threshold Pulse Width: 0.5 ms
Lead Channel Sensing Intrinsic Amplitude: 0.3 mV
Lead Channel Sensing Intrinsic Amplitude: 9.3 mV
Lead Channel Setting Pacing Amplitude: 2 V
Lead Channel Setting Pacing Amplitude: 3 V
Lead Channel Setting Pacing Pulse Width: 0.5 ms
Lead Channel Setting Pacing Pulse Width: 0.5 ms
Lead Channel Setting Sensing Sensitivity: 0.5 mV
Pulse Gen Serial Number: 7413271

## 2017-08-12 ENCOUNTER — Encounter: Payer: Self-pay | Admitting: Cardiology

## 2017-08-29 ENCOUNTER — Other Ambulatory Visit: Payer: Self-pay | Admitting: Cardiology

## 2017-10-10 ENCOUNTER — Other Ambulatory Visit: Payer: Self-pay | Admitting: Cardiology

## 2017-10-10 ENCOUNTER — Other Ambulatory Visit: Payer: Self-pay | Admitting: Internal Medicine

## 2017-11-07 ENCOUNTER — Ambulatory Visit (INDEPENDENT_AMBULATORY_CARE_PROVIDER_SITE_OTHER): Payer: 59 | Admitting: *Deleted

## 2017-11-07 DIAGNOSIS — I429 Cardiomyopathy, unspecified: Secondary | ICD-10-CM

## 2017-11-07 DIAGNOSIS — I428 Other cardiomyopathies: Secondary | ICD-10-CM

## 2017-11-09 ENCOUNTER — Other Ambulatory Visit: Payer: Self-pay | Admitting: Cardiology

## 2017-11-09 NOTE — Progress Notes (Signed)
Remote ICD transmission.   

## 2017-11-10 ENCOUNTER — Ambulatory Visit: Payer: 59 | Admitting: Cardiology

## 2017-11-10 ENCOUNTER — Telehealth: Payer: Self-pay | Admitting: *Deleted

## 2017-11-10 LAB — CUP PACEART REMOTE DEVICE CHECK
Battery Remaining Longevity: 66 mo
Battery Remaining Percentage: 87 %
Battery Voltage: 3.02 V
Date Time Interrogation Session: 20190114113610
HighPow Impedance: 73 Ohm
HighPow Impedance: 73 Ohm
Implantable Lead Implant Date: 20160629
Implantable Lead Implant Date: 20160629
Implantable Lead Implant Date: 20180416
Implantable Lead Location: 753858
Implantable Lead Location: 753859
Implantable Lead Location: 753860
Implantable Lead Model: 7122
Implantable Pulse Generator Implant Date: 20180416
Lead Channel Impedance Value: 380 Ohm
Lead Channel Impedance Value: 460 Ohm
Lead Channel Impedance Value: 580 Ohm
Lead Channel Pacing Threshold Amplitude: 0.5 V
Lead Channel Pacing Threshold Amplitude: 0.75 V
Lead Channel Pacing Threshold Pulse Width: 0.5 ms
Lead Channel Pacing Threshold Pulse Width: 0.5 ms
Lead Channel Sensing Intrinsic Amplitude: 0.3 mV
Lead Channel Sensing Intrinsic Amplitude: 8.8 mV
Lead Channel Setting Pacing Amplitude: 2 V
Lead Channel Setting Pacing Amplitude: 3 V
Lead Channel Setting Pacing Pulse Width: 0.5 ms
Lead Channel Setting Pacing Pulse Width: 0.5 ms
Lead Channel Setting Sensing Sensitivity: 0.5 mV
Pulse Gen Serial Number: 7413271

## 2017-11-10 NOTE — Telephone Encounter (Signed)
Entresto PA completed and approved until 11/10/18, or until coverage for the medication is no longer available under the benefit plan

## 2017-11-11 ENCOUNTER — Encounter: Payer: Self-pay | Admitting: Cardiology

## 2017-11-28 ENCOUNTER — Other Ambulatory Visit: Payer: Self-pay | Admitting: Cardiology

## 2017-12-12 ENCOUNTER — Other Ambulatory Visit: Payer: Self-pay | Admitting: Cardiology

## 2017-12-13 ENCOUNTER — Other Ambulatory Visit: Payer: Self-pay

## 2017-12-13 ENCOUNTER — Ambulatory Visit (INDEPENDENT_AMBULATORY_CARE_PROVIDER_SITE_OTHER): Payer: Medicare Other | Admitting: Cardiology

## 2017-12-13 ENCOUNTER — Encounter: Payer: Self-pay | Admitting: Cardiology

## 2017-12-13 ENCOUNTER — Telehealth: Payer: Self-pay | Admitting: Cardiology

## 2017-12-13 VITALS — BP 106/72 | HR 60 | Ht 72.0 in | Wt 336.0 lb

## 2017-12-13 DIAGNOSIS — I4891 Unspecified atrial fibrillation: Secondary | ICD-10-CM

## 2017-12-13 DIAGNOSIS — I5022 Chronic systolic (congestive) heart failure: Secondary | ICD-10-CM | POA: Diagnosis not present

## 2017-12-13 DIAGNOSIS — I472 Ventricular tachycardia, unspecified: Secondary | ICD-10-CM
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
## 2017-12-13 NOTE — Telephone Encounter (Signed)
Pre-cert Verification for the following procedure   Echo scheduled for 12-21-17

## 2017-12-13 NOTE — Patient Instructions (Signed)
Your physician recommends that you schedule a follow-up appointment in: 4 MONTHS WITH DR [facility]  Your physician recommends that you continue on your current medications as directed. Please refer to the Current Medication list given to you today.  Your physician recommends that you return for lab work BMP/MG - ORDERS GIVEN TO YOU TODAY  Your physician has requested that you have an echocardiogram. Echocardiography is a painless test that uses sound waves to create images of your heart. It provides your doctor with information about the size and shape of your heart and how well your heart's chambers and valves are working. This procedure takes approximately one hour. There are no restrictions for this procedure.  Thank you for choosing [facility]!!

## 2017-12-14 ENCOUNTER — Other Ambulatory Visit: Payer: Self-pay | Admitting: Cardiology

## 2017-12-14 DIAGNOSIS — I5022 Chronic systolic (congestive) heart failure: Secondary | ICD-10-CM | POA: Diagnosis not present

## 2017-12-15 LAB — BASIC METABOLIC PANEL
BUN/Creatinine Ratio: 15 (ref 9–20)
BUN: 15 mg/dL (ref 6–24)
CO2: 26 mmol/L (ref 20–29)
Calcium: 9.4 mg/dL (ref 8.7–10.2)
Chloride: 101 mmol/L (ref 96–106)
Creatinine, Ser: 0.97 mg/dL (ref 0.76–1.27)
GFR calc Af Amer: 105 mL/min/{1.73_m2} (ref >59)
GFR calc non Af Amer: 91 mL/min/{1.73_m2} (ref >59)
Glucose: 102 mg/dL — ABNORMAL HIGH (ref 65–99)
Potassium: 4.7 mmol/L (ref 3.5–5.2)
Sodium: 141 mmol/L (ref 134–144)

## 2017-12-15 LAB — MAGNESIUM: Magnesium: 1.9 mg/dL (ref 1.6–2.3)

## 2017-12-15 LAB — SPECIMEN STATUS REPORT

## 2017-12-16 ENCOUNTER — Telehealth: Payer: Self-pay | Admitting: *Deleted

## 2017-12-16 NOTE — Telephone Encounter (Signed)
Pt aware - routed to pcp  

## 2017-12-16 NOTE — Telephone Encounter (Signed)
-----   Message from Antoine Poche, MD sent at 12/15/2017 10:00 AM EST ----- Labs look good   Dominga Ferry MD

## 2017-12-21 ENCOUNTER — Ambulatory Visit (INDEPENDENT_AMBULATORY_CARE_PROVIDER_SITE_OTHER): Payer: 59

## 2017-12-21 ENCOUNTER — Other Ambulatory Visit: Payer: Self-pay

## 2017-12-21 DIAGNOSIS — I5022 Chronic systolic (congestive) heart failure: Secondary | ICD-10-CM | POA: Diagnosis not present

## 2017-12-23 ENCOUNTER — Telehealth: Payer: Self-pay | Admitting: Cardiology

## 2017-12-23 NOTE — Telephone Encounter (Signed)
Nasal congestion, no fever.  Suggested he could use OTC Corcidin HBP, no sudafed as this can affect his BP & HR.  Tylenol, nasal washes / saline, and Delsym for cough if needed.  If develop fever, along with dry cough, aches & pains - should see his pmd to r/u infection or flu.  Wife (Pencie) verbalized understanding.

## 2017-12-23 NOTE — Telephone Encounter (Signed)
Wife called asking what he can take over the counter for his head cold

## 2018-01-09 ENCOUNTER — Other Ambulatory Visit: Payer: Self-pay | Admitting: Cardiology

## 2018-02-03 ENCOUNTER — Encounter: Payer: 59 | Admitting: Internal Medicine

## 2018-02-06 ENCOUNTER — Ambulatory Visit (INDEPENDENT_AMBULATORY_CARE_PROVIDER_SITE_OTHER): Payer: 59 | Admitting: *Deleted

## 2018-02-06 DIAGNOSIS — I429 Cardiomyopathy, unspecified: Secondary | ICD-10-CM

## 2018-02-06 DIAGNOSIS — I428 Other cardiomyopathies: Secondary | ICD-10-CM | POA: Diagnosis not present

## 2018-02-07 NOTE — Progress Notes (Signed)
Remote ICD transmission.   

## 2018-02-08 ENCOUNTER — Encounter: Payer: Self-pay | Admitting: Cardiology

## 2018-02-13 LAB — CUP PACEART REMOTE DEVICE CHECK
Battery Remaining Longevity: 64 mo
Battery Remaining Percentage: 84 %
Battery Voltage: 2.99 V
Date Time Interrogation Session: 20190415104032
HighPow Impedance: 69 Ohm
HighPow Impedance: 69 Ohm
Implantable Lead Implant Date: 20160629
Implantable Lead Implant Date: 20160629
Implantable Lead Implant Date: 20180416
Implantable Lead Location: 753858
Implantable Lead Location: 753859
Implantable Lead Location: 753860
Implantable Lead Model: 7122
Implantable Pulse Generator Implant Date: 20180416
Lead Channel Impedance Value: 400 Ohm
Lead Channel Impedance Value: 490 Ohm
Lead Channel Impedance Value: 580 Ohm
Lead Channel Pacing Threshold Amplitude: 0.625 V
Lead Channel Pacing Threshold Amplitude: 0.75 V
Lead Channel Pacing Threshold Pulse Width: 0.5 ms
Lead Channel Pacing Threshold Pulse Width: 0.5 ms
Lead Channel Sensing Intrinsic Amplitude: 0.3 mV
Lead Channel Sensing Intrinsic Amplitude: 8.5 mV
Lead Channel Setting Pacing Amplitude: 2 V
Lead Channel Setting Pacing Amplitude: 3 V
Lead Channel Setting Pacing Pulse Width: 0.5 ms
Lead Channel Setting Pacing Pulse Width: 0.5 ms
Lead Channel Setting Sensing Sensitivity: 0.5 mV
Pulse Gen Serial Number: 7413271

## 2018-03-28 DIAGNOSIS — I5022 Chronic systolic (congestive) heart failure: Secondary | ICD-10-CM | POA: Diagnosis not present

## 2018-03-28 DIAGNOSIS — G4733 Obstructive sleep apnea (adult) (pediatric): Secondary | ICD-10-CM | POA: Diagnosis not present

## 2018-03-28 DIAGNOSIS — I482 Chronic atrial fibrillation: Secondary | ICD-10-CM | POA: Diagnosis not present

## 2018-03-29 ENCOUNTER — Encounter: Payer: Self-pay | Admitting: Cardiology

## 2018-03-29 DIAGNOSIS — I5022 Chronic systolic (congestive) heart failure: Secondary | ICD-10-CM | POA: Diagnosis not present

## 2018-03-29 DIAGNOSIS — G4733 Obstructive sleep apnea (adult) (pediatric): Secondary | ICD-10-CM | POA: Diagnosis not present

## 2018-03-29 DIAGNOSIS — E785 Hyperlipidemia, unspecified: Secondary | ICD-10-CM | POA: Diagnosis not present

## 2018-03-29 LAB — LIPID PANEL
Cholesterol: 174 (ref 0–200)
LDL Cholesterol: 114
Triglycerides: 82 (ref 40–160)

## 2018-03-29 LAB — PSA: PSA: 0.6

## 2018-04-10 ENCOUNTER — Encounter: Payer: Self-pay | Admitting: Nurse Practitioner

## 2018-04-12 ENCOUNTER — Other Ambulatory Visit: Payer: Self-pay

## 2018-04-12 ENCOUNTER — Encounter: Payer: Self-pay | Admitting: Cardiology

## 2018-04-12 ENCOUNTER — Ambulatory Visit (INDEPENDENT_AMBULATORY_CARE_PROVIDER_SITE_OTHER): Payer: 59 | Admitting: Cardiology

## 2018-04-12 ENCOUNTER — Encounter: Payer: Self-pay | Admitting: *Deleted

## 2018-04-12 VITALS — BP 108/72 | HR 83 | Ht 72.0 in | Wt 337.0 lb

## 2018-04-12 DIAGNOSIS — I472 Ventricular tachycardia, unspecified: Secondary | ICD-10-CM

## 2018-04-12 DIAGNOSIS — G4733 Obstructive sleep apnea (adult) (pediatric): Secondary | ICD-10-CM

## 2018-04-12 DIAGNOSIS — I4891 Unspecified atrial fibrillation: Secondary | ICD-10-CM

## 2018-04-12 DIAGNOSIS — I5022 Chronic systolic (congestive) heart failure: Secondary | ICD-10-CM
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
## 2018-04-12 NOTE — Patient Instructions (Addendum)
Medication Instructions:  Continue all current medications.  Labwork: none  Testing/Procedures: none  Follow-Up: 4 months   Any Other Special Instructions Will Be Listed Below (If Applicable).  If you need a refill on your cardiac medications before your next appointment, please call your pharmacy.\ 

## 2018-04-13 ENCOUNTER — Other Ambulatory Visit: Payer: Self-pay | Admitting: Cardiology

## 2018-04-20 ENCOUNTER — Encounter: Payer: Self-pay | Admitting: Cardiology

## 2018-04-21 ENCOUNTER — Ambulatory Visit (INDEPENDENT_AMBULATORY_CARE_PROVIDER_SITE_OTHER): Payer: 59 | Admitting: Internal Medicine

## 2018-04-21 ENCOUNTER — Encounter: Payer: Self-pay | Admitting: Internal Medicine

## 2018-04-21 VITALS — BP 90/68 | HR 67 | Ht 72.0 in | Wt 334.8 lb

## 2018-04-21 DIAGNOSIS — Z95810 Presence of automatic (implantable) cardiac defibrillator: Secondary | ICD-10-CM | POA: Diagnosis not present

## 2018-04-21 DIAGNOSIS — I481 Persistent atrial fibrillation: Secondary | ICD-10-CM

## 2018-04-21 DIAGNOSIS — I420 Dilated cardiomyopathy: Secondary | ICD-10-CM | POA: Diagnosis not present

## 2018-04-21 DIAGNOSIS — I472 Ventricular tachycardia, unspecified: Secondary | ICD-10-CM

## 2018-04-21 DIAGNOSIS — I5022 Chronic systolic (congestive) heart failure: Secondary | ICD-10-CM | POA: Diagnosis not present

## 2018-04-21 DIAGNOSIS — I4819 Other persistent atrial fibrillation: Secondary | ICD-10-CM

## 2018-04-21 LAB — CUP PACEART INCLINIC DEVICE CHECK
Battery Remaining Longevity: 72 mo
Brady Statistic RA Percent Paced: 0 %
Brady Statistic RV Percent Paced: 92 %
Date Time Interrogation Session: 20190628112329
HighPow Impedance: 77.625
Implantable Lead Implant Date: 20160629
Implantable Lead Implant Date: 20160629
Implantable Lead Implant Date: 20180416
Implantable Lead Location: 753858
Implantable Lead Location: 753859
Implantable Lead Location: 753860
Implantable Lead Model: 7122
Implantable Pulse Generator Implant Date: 20180416
Lead Channel Impedance Value: 387.5 Ohm
Lead Channel Impedance Value: 525 Ohm
Lead Channel Impedance Value: 587.5 Ohm
Lead Channel Pacing Threshold Amplitude: 0.5 V
Lead Channel Pacing Threshold Amplitude: 0.5 V
Lead Channel Pacing Threshold Amplitude: 0.5 V
Lead Channel Pacing Threshold Pulse Width: 0.5 ms
Lead Channel Pacing Threshold Pulse Width: 0.5 ms
Lead Channel Pacing Threshold Pulse Width: 0.5 ms
Lead Channel Sensing Intrinsic Amplitude: 0.4 mV
Lead Channel Sensing Intrinsic Amplitude: 12 mV
Lead Channel Setting Pacing Amplitude: 2 V
Lead Channel Setting Pacing Amplitude: 2 V
Lead Channel Setting Pacing Pulse Width: 0.5 ms
Lead Channel Setting Pacing Pulse Width: 0.5 ms
Lead Channel Setting Sensing Sensitivity: 0.5 mV
Pulse Gen Serial Number: 7413271

## 2018-04-21 NOTE — Patient Instructions (Signed)
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
## 2018-05-08 ENCOUNTER — Ambulatory Visit (INDEPENDENT_AMBULATORY_CARE_PROVIDER_SITE_OTHER): Payer: 59 | Admitting: *Deleted

## 2018-05-08 DIAGNOSIS — I472 Ventricular tachycardia, unspecified: Secondary | ICD-10-CM

## 2018-05-09 LAB — CUP PACEART REMOTE DEVICE CHECK
Battery Remaining Longevity: 73 mo
Battery Remaining Percentage: 80 %
Battery Voltage: 2.98 V
Date Time Interrogation Session: 20190715162243
HighPow Impedance: 75 Ohm
HighPow Impedance: 75 Ohm
Implantable Lead Implant Date: 20160629
Implantable Lead Implant Date: 20160629
Implantable Lead Implant Date: 20180416
Implantable Lead Location: 753858
Implantable Lead Location: 753859
Implantable Lead Location: 753860
Implantable Lead Model: 7122
Implantable Pulse Generator Implant Date: 20180416
Lead Channel Impedance Value: 390 Ohm
Lead Channel Impedance Value: 490 Ohm
Lead Channel Impedance Value: 590 Ohm
Lead Channel Pacing Threshold Amplitude: 0.5 V
Lead Channel Pacing Threshold Amplitude: 0.5 V
Lead Channel Pacing Threshold Pulse Width: 0.5 ms
Lead Channel Pacing Threshold Pulse Width: 0.5 ms
Lead Channel Sensing Intrinsic Amplitude: 0.4 mV
Lead Channel Sensing Intrinsic Amplitude: 10.6 mV
Lead Channel Setting Pacing Amplitude: 2 V
Lead Channel Setting Pacing Amplitude: 2 V
Lead Channel Setting Pacing Pulse Width: 0.5 ms
Lead Channel Setting Pacing Pulse Width: 0.5 ms
Lead Channel Setting Sensing Sensitivity: 0.5 mV
Pulse Gen Serial Number: 7413271

## 2018-05-09 NOTE — Progress Notes (Signed)
Remote ICD transmission.   

## 2018-05-10 ENCOUNTER — Encounter: Payer: Self-pay | Admitting: Cardiology

## 2018-05-12 ENCOUNTER — Other Ambulatory Visit: Payer: Self-pay | Admitting: Cardiology

## 2018-06-10 ENCOUNTER — Other Ambulatory Visit: Payer: Self-pay | Admitting: Cardiology

## 2018-06-29 ENCOUNTER — Other Ambulatory Visit: Payer: Self-pay | Admitting: Cardiology

## 2018-07-05 ENCOUNTER — Encounter: Payer: Self-pay | Admitting: Nurse Practitioner

## 2018-07-05 ENCOUNTER — Ambulatory Visit (INDEPENDENT_AMBULATORY_CARE_PROVIDER_SITE_OTHER): Payer: 59 | Admitting: Nurse Practitioner

## 2018-07-05 VITALS — BP 96/68 | HR 71 | Temp 97.9°F | Ht 72.0 in | Wt 329.8 lb

## 2018-07-05 DIAGNOSIS — Z7901 Long term (current) use of anticoagulants: Secondary | ICD-10-CM
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
## 2018-07-05 NOTE — Progress Notes (Signed)
CC'D TO PCP °

## 2018-07-05 NOTE — Assessment & Plan Note (Signed)
The patient is on chronic anticoagulation for AFib and CHF (PPM and AICD in place).  He is currently due for first ever colonoscopy as his age 52.  He was referred to our office for an office visit due to anticoagulation and chronic medical history.  At this point he appears to be doing well.  AICD/permanent pacemaker in place which only fired one time about a month after placement and has not fired since.  His heart failure ejection fraction is stable at 20 to 25%.  He is morbidly obese.  At this point I cannot see any major contraindications to proceeding with a colonoscopy.  Given his morbid obesity and his chronic comorbidities we will plan for the procedure with monitored anesthesia care.  Proceed with colonoscopy on propofol/MAC with Dr. Oneida Alar in the near future. The risks, benefits, and alternatives have been discussed in detail with the patient. They state understanding and desire to proceed.   The patient is currently on Xarelto.  We will contact cardiology to hold this for 48 hours prior to his procedure.  The patient is not on any other anticoagulants, anxiolytics, chronic pain medications, antidepressants.  His BMI is 44.7.  Multiple comorbidities as per HPI.  We will plan for the procedure on propofol/monitored anesthesia care.

## 2018-07-05 NOTE — Patient Instructions (Signed)
1. We will contact your cardiologist for the okay to hold your blood thinner for 48 hours prior to your procedure. 2. We will schedule your colonoscopy for you. 3. Further recommendations will be made after your colonoscopy. 4. Return for follow-up based on recommendations made after your colonoscopy.  At [facility] we value your feedback. You may receive a survey about your visit today. Please share your experience as we strive to create trusting relationships with our patients to provide genuine, compassionate, quality care.  We appreciate your understanding and patience as we review any laboratory studies, imaging, and other diagnostic tests that are ordered as we care for you. Our office policy is 5 business days for review of these results, and any emergent or urgent results are addressed in a timely manner for your best interest. If you do not hear from our office in 1 week, please contact us.   We also encourage the use of MyChart, which contains your medical information for your review as well. If you are not enrolled in this feature, an access code is on this after visit summary for your convenience. Thank you for allowing [name] to be involved in your care.  It was great to meet you both today!  I hope you have a great rest of your summer!!

## 2018-07-07 ENCOUNTER — Other Ambulatory Visit: Payer: Self-pay

## 2018-07-07 ENCOUNTER — Telehealth: Payer: Self-pay

## 2018-07-07 DIAGNOSIS — Z1211 Encounter for screening for malignant neoplasm of colon: Secondary | ICD-10-CM

## 2018-07-07 MED ORDER — NA SULFATE-K SULFATE-MG SULF 17.5-3.13-1.6 GM/177ML PO SOLN: 1.0000 | ORAL | 0 refills | Status: DC

## 2018-07-07 NOTE — Telephone Encounter (Signed)
Dr. Wyline Mood, Pt is due for a colonoscopy with Dr. Darrick Penna. Is it ok to hold Xarelto 48 hours prior to procedure? Please advise.

## 2018-07-07 NOTE — Telephone Encounter (Signed)
Ok to hold xarelto 48 hrs prior to procedure, resume day after procedure.

## 2018-07-07 NOTE — Telephone Encounter (Signed)
Noted, routing message 

## 2018-07-07 NOTE — Telephone Encounter (Signed)
Called and informed pt ok to hold Xarelto for 48 hours prior to TCS. TCS w/Propofol w/SLF scheduled for 08/29/18 at 7:30am. Rx for prep sent to pharmacy. Orders entered. Will mail instructions after pre-op appt is scheduled.

## 2018-07-10 NOTE — Telephone Encounter (Signed)
Called and informed pt of pre-op appt 08/23/18 at 10:00am. Letter mailed with procedure instructions.

## 2018-07-10 NOTE — Telephone Encounter (Signed)
PA for TCS submitted via [facility] website. Case approved. PA# [id number].

## 2018-07-11 ENCOUNTER — Other Ambulatory Visit: Payer: Self-pay | Admitting: Cardiology

## 2018-07-19 DIAGNOSIS — Z23 Encounter for immunization: Secondary | ICD-10-CM | POA: Diagnosis not present

## 2018-07-29 DIAGNOSIS — Z1211 Encounter for screening for malignant neoplasm of colon: Secondary | ICD-10-CM | POA: Diagnosis not present

## 2018-07-29 DIAGNOSIS — K648 Other hemorrhoids: Secondary | ICD-10-CM | POA: Diagnosis not present

## 2018-08-07 ENCOUNTER — Ambulatory Visit (INDEPENDENT_AMBULATORY_CARE_PROVIDER_SITE_OTHER): Payer: 59 | Admitting: *Deleted

## 2018-08-07 DIAGNOSIS — I472 Ventricular tachycardia, unspecified: Secondary | ICD-10-CM

## 2018-08-08 NOTE — Progress Notes (Signed)
Remote ICD transmission.   

## 2018-08-09 ENCOUNTER — Other Ambulatory Visit: Payer: Self-pay | Admitting: Cardiology

## 2018-08-10 ENCOUNTER — Encounter: Payer: Self-pay | Admitting: Cardiology

## 2018-08-16 NOTE — Patient Instructions (Signed)
Brandon Torres  08/16/2018     @PREFPERIOPPHARMACY @   Your procedure is scheduled on  08/29/2018.  Report to [facility] at  615   A.M.  Call this number if you have problems the morning of surgery:  [phone]   Remember:  Follow the diet and prep instructions given to you by Dr Evelina Dun office.                       Take these medicines the morning of surgery with A SIP OF WATER Entresto, metorpolol. Follow any instructions given to you concerning your xarelto.    Do not wear jewelry, make-up or nail polish.  Do not wear lotions, powders, or perfumes, or deodorant.  Do not shave 48 hours prior to surgery.  Men may shave face and neck.  Do not bring valuables to the hospital.  [facility] is not responsible for any belongings or valuables.  Contacts, dentures or bridgework may not be worn into surgery.  Leave your suitcase in the car.  After surgery it may be brought to your room.  For patients admitted to the hospital, discharge time will be determined by your treatment team.  Patients discharged the day of surgery will not be allowed to drive home.   Name and phone number of your driver:    Family  Special instructions:  None  Please read over the following fact sheets that you were given. Anesthesia Post-op Instructions and Care and Recovery After Surgery       Colonoscopy, Adult A colonoscopy is an exam to look at the large intestine. It is done to check for problems, such as:  Lumps (tumors).  Growths (polyps).  Swelling (inflammation).  Bleeding.  What happens before the procedure? Eating and drinking Follow instructions from your doctor about eating and drinking. These instructions may include:  A few days before the procedure - follow a low-fiber diet. ? Avoid nuts. ? Avoid seeds. ? Avoid dried fruit. ? Avoid raw fruits. ? Avoid vegetables.  1-3 days before the procedure - follow a clear liquid diet. Avoid liquids that have red  or purple dye. Drink only clear liquids, such as: ? Clear broth or bouillon. ? Black coffee or tea. ? Clear juice. ? Clear soft drinks or sports drinks. ? Gelatin dessert. ? Popsicles.  On the day of the procedure - do not eat or drink anything during the 2 hours before the procedure.  Bowel prep If you were prescribed an oral bowel prep:  Take it as told by your doctor. Starting the day before your procedure, you will need to drink a lot of liquid. The liquid will cause you to poop (have bowel movements) until your poop is almost clear or light green.  If your skin or butt gets irritated from diarrhea, you may: ? Wipe the area with wipes that have medicine in them, such as adult wet wipes with aloe and vitamin E. ? Put something on your skin that soothes the area, such as petroleum jelly.  If you throw up (vomit) while drinking the bowel prep, take a break for up to 60 minutes. Then begin the bowel prep again. If you keep throwing up and you cannot take the bowel prep without throwing up, call your doctor.  General instructions  Ask your doctor about changing or stopping your normal medicines. This is important if you take diabetes medicines or blood thinners.  Plan to have someone take you home from the hospital or clinic. What happens during the procedure?  An IV tube may be put into one of your veins.  You will be given medicine to help you relax (sedative).  To reduce your risk of infection: ? Your doctors will wash their hands. ? Your anal area will be washed with soap.  You will be asked to lie on your side with your knees bent.  Your doctor will get a long, thin, flexible tube ready. The tube will have a camera and a light on the end.  The tube will be put into your anus.  The tube will be gently put into your large intestine.  Air will be delivered into your large intestine to keep it open. You may feel some pressure or cramping.  The camera will be used to take  photos.  A small tissue sample may be removed from your body to be looked at under a microscope (biopsy). If any possible problems are found, the tissue will be sent to a lab for testing.  If small growths are found, your doctor may remove them and have them checked for cancer.  The tube that was put into your anus will be slowly removed. The procedure may vary among doctors and hospitals. What happens after the procedure?  Your doctor will check on you often until the medicines you were given have worn off.  Do not drive for 24 hours after the procedure.  You may have a small amount of blood in your poop.  You may pass gas.  You may have mild cramps or bloating in your belly (abdomen).  It is up to you to get the results of your procedure. Ask your doctor, or the department performing the procedure, when your results will be ready. This information is not intended to replace advice given to you by your health care provider. Make sure you discuss any questions you have with your health care provider. Document Released: 11/13/2010 Document Revised: 08/11/2016 Document Reviewed: 12/23/2015 Elsevier Interactive Patient Education  2017 [employer]  Colonoscopy, Adult, Care After This sheet gives you information about how to care for yourself after your procedure. Your health care provider may also give you more specific instructions. If you have problems or questions, contact your health care provider. What can I expect after the procedure? After the procedure, it is common to have:  A small amount of blood in your stool for 24 hours after the procedure.  Some gas.  Mild abdominal cramping or bloating.  Follow these instructions at home: General instructions   For the first 24 hours after the procedure: ? Do not drive or use machinery. ? Do not sign important documents. ? Do not drink alcohol. ? Do your regular daily activities at a slower pace than normal. ? Eat soft,  easy-to-digest foods. ? Rest often.  Take over-the-counter or prescription medicines only as told by your health care provider.  It is up to you to get the results of your procedure. Ask your health care provider, or the department performing the procedure, when your results will be ready. Relieving cramping and bloating  Try walking around when you have cramps or feel bloated.  Apply heat to your abdomen as told by your health care provider. Use a heat source that your health care provider recommends, such as a moist heat pack or a heating pad. ? Place a towel between your skin and the heat source. ? Leave  the heat on for 20-30 minutes. ? Remove the heat if your skin turns bright red. This is especially important if you are unable to feel pain, heat, or cold. You may have a greater risk of getting burned. Eating and drinking  Drink enough fluid to keep your urine clear or pale yellow.  Resume your normal diet as instructed by your health care provider. Avoid heavy or fried foods that are hard to digest.  Avoid drinking alcohol for as long as instructed by your health care provider. Contact a health care provider if:  You have blood in your stool 2-3 days after the procedure. Get help right away if:  You have more than a small spotting of blood in your stool.  You pass large blood clots in your stool.  Your abdomen is swollen.  You have nausea or vomiting.  You have a fever.  You have increasing abdominal pain that is not relieved with medicine. This information is not intended to replace advice given to you by your health care provider. Make sure you discuss any questions you have with your health care provider. Document Released: 05/25/2004 Document Revised: 07/05/2016 Document Reviewed: 12/23/2015 Elsevier Interactive Patient Education  2018 [place] Anesthesia is a term that refers to techniques, procedures, and medicines that help a  person stay safe and comfortable during a medical procedure. Monitored anesthesia care, or sedation, is one type of anesthesia. Your anesthesia specialist may recommend sedation if you will be having a procedure that does not require you to be unconscious, such as:  Cataract surgery.  A dental procedure.  A biopsy.  A colonoscopy.  During the procedure, you may receive a medicine to help you relax (sedative). There are three levels of sedation:  Mild sedation. At this level, you may feel awake and relaxed. You will be able to follow directions.  Moderate sedation. At this level, you will be sleepy. You may not remember the procedure.  Deep sedation. At this level, you will be asleep. You will not remember the procedure.  The more medicine you are given, the deeper your level of sedation will be. Depending on how you respond to the procedure, the anesthesia specialist may change your level of sedation or the type of anesthesia to fit your needs. An anesthesia specialist will monitor you closely during the procedure. Let your health care provider know about:  Any allergies you have.  All medicines you are taking, including vitamins, herbs, eye drops, creams, and over-the-counter medicines.  Any use of steroids (by mouth or as a cream).  Any problems you or family members have had with sedatives and anesthetic medicines.  Any blood disorders you have.  Any surgeries you have had.  Any medical conditions you have, such as sleep apnea.  Whether you are pregnant or may be pregnant.  Any use of cigarettes, alcohol, or street drugs. What are the risks? Generally, this is a safe procedure. However, problems may occur, including:  Getting too much medicine (oversedation).  Nausea.  Allergic reaction to medicines.  Trouble breathing. If this happens, a breathing tube may be used to help with breathing. It will be removed when you are awake and breathing on your own.  Heart  trouble.  Lung trouble.  Before the procedure Staying hydrated Follow instructions from your health care provider about hydration, which may include:  Up to 2 hours before the procedure - you may continue to drink clear liquids, such as water, clear fruit  juice, black coffee, and plain tea.  Eating and drinking restrictions Follow instructions from your health care provider about eating and drinking, which may include:  8 hours before the procedure - stop eating heavy meals or foods such as meat, fried foods, or fatty foods.  6 hours before the procedure - stop eating light meals or foods, such as toast or cereal.  6 hours before the procedure - stop drinking milk or drinks that contain milk.  2 hours before the procedure - stop drinking clear liquids.  Medicines Ask your health care provider about:  Changing or stopping your regular medicines. This is especially important if you are taking diabetes medicines or blood thinners.  Taking medicines such as aspirin and ibuprofen. These medicines can thin your blood. Do not take these medicines before your procedure if your health care provider instructs you not to.  Tests and exams  You will have a physical exam.  You may have blood tests done to show: ? How well your kidneys and liver are working. ? How well your blood can clot.  General instructions  Plan to have someone take you home from the hospital or clinic.  If you will be going home right after the procedure, plan to have someone with you for 24 hours.  What happens during the procedure?  Your blood pressure, heart rate, breathing, level of pain and overall condition will be monitored.  An IV tube will be inserted into one of your veins.  Your anesthesia specialist will give you medicines as needed to keep you comfortable during the procedure. This may mean changing the level of sedation.  The procedure will be performed. After the procedure  Your blood  pressure, heart rate, breathing rate, and blood oxygen level will be monitored until the medicines you were given have worn off.  Do not drive for 24 hours if you received a sedative.  You may: ? Feel sleepy, clumsy, or nauseous. ? Feel forgetful about what happened after the procedure. ? Have a sore throat if you had a breathing tube during the procedure. ? Vomit. This information is not intended to replace advice given to you by your health care provider. Make sure you discuss any questions you have with your health care provider. Document Released: 07/07/2005 Document Revised: 03/19/2016 Document Reviewed: 02/01/2016 Elsevier Interactive Patient Education  2018 [place], Care After These instructions provide you with information about caring for yourself after your procedure. Your health care provider may also give you more specific instructions. Your treatment has been planned according to current medical practices, but problems sometimes occur. Call your health care provider if you have any problems or questions after your procedure. What can I expect after the procedure? After your procedure, it is common to:  Feel sleepy for several hours.  Feel clumsy and have poor balance for several hours.  Feel forgetful about what happened after the procedure.  Have poor judgment for several hours.  Feel nauseous or vomit.  Have a sore throat if you had a breathing tube during the procedure.  Follow these instructions at home: For at least 24 hours after the procedure:   Do not: ? Participate in activities in which you could fall or become injured. ? Drive. ? Use heavy machinery. ? Drink alcohol. ? Take sleeping pills or medicines that cause drowsiness. ? Make important decisions or sign legal documents. ? Take care of children on your own.  Rest. Eating and drinking  Follow  the diet that is recommended by your health care provider.  If you  vomit, drink water, juice, or soup when you can drink without vomiting.  Make sure you have little or no nausea before eating solid foods. General instructions  Have a responsible adult stay with you until you are awake and alert.  Take over-the-counter and prescription medicines only as told by your health care provider.  If you smoke, do not smoke without supervision.  Keep all follow-up visits as told by your health care provider. This is important. Contact a health care provider if:  You keep feeling nauseous or you keep vomiting.  You feel light-headed.  You develop a rash.  You have a fever. Get help right away if:  You have trouble breathing. This information is not intended to replace advice given to you by your health care provider. Make sure you discuss any questions you have with your health care provider. Document Released: 02/01/2016 Document Revised: 06/02/2016 Document Reviewed: 02/01/2016 Elsevier Interactive Patient Education  [employer].

## 2018-08-17 ENCOUNTER — Ambulatory Visit (INDEPENDENT_AMBULATORY_CARE_PROVIDER_SITE_OTHER): Payer: 59 | Admitting: Cardiology

## 2018-08-17 ENCOUNTER — Encounter: Payer: Self-pay | Admitting: Cardiology

## 2018-08-17 ENCOUNTER — Encounter: Payer: Self-pay | Admitting: *Deleted

## 2018-08-17 VITALS — BP 98/72 | HR 72 | Ht 72.0 in | Wt 333.0 lb

## 2018-08-17 DIAGNOSIS — I5022 Chronic systolic (congestive) heart failure: Secondary | ICD-10-CM | POA: Diagnosis not present

## 2018-08-17 DIAGNOSIS — I4891 Unspecified atrial fibrillation: Secondary | ICD-10-CM

## 2018-08-17 DIAGNOSIS — I472 Ventricular tachycardia, unspecified: Secondary | ICD-10-CM

## 2018-08-17 NOTE — Patient Instructions (Signed)
Your physician recommends that you schedule a follow-up appointment in: 4 MONTHS WITH DR BRANCH  Your physician recommends that you continue on your current medications as directed. Please refer to the Current Medication list given to you today.  Thank you for choosing [facility]!!    
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
## 2018-08-23 ENCOUNTER — Encounter (HOSPITAL_COMMUNITY): Payer: Self-pay

## 2018-08-23 ENCOUNTER — Other Ambulatory Visit: Payer: Self-pay

## 2018-08-23 ENCOUNTER — Encounter (HOSPITAL_COMMUNITY)
Admission: RE | Admit: 2018-08-23 | Discharge: 2018-08-23 | Disposition: A | Discharge status: Home / Self Care | Payer: 59 | Source: Ambulatory Visit | Attending: Gastroenterology | Admitting: Gastroenterology

## 2018-08-23 DIAGNOSIS — Z1211 Encounter for screening for malignant neoplasm of colon: Secondary | ICD-10-CM | POA: Diagnosis not present

## 2018-08-23 DIAGNOSIS — Z01818 Encounter for other preprocedural examination: Secondary | ICD-10-CM | POA: Insufficient documentation

## 2018-08-23 LAB — BASIC METABOLIC PANEL
Anion gap: 9 (ref 5–15)
BUN: 15 mg/dL (ref 6–20)
CO2: 23 mmol/L (ref 22–32)
Calcium: 9.2 mg/dL (ref 8.9–10.3)
Chloride: 104 mmol/L (ref 98–111)
Creatinine, Ser: 0.81 mg/dL (ref 0.61–1.24)
GFR calc Af Amer: >60 mL/min (ref >60)
GFR calc non Af Amer: >60 mL/min (ref >60)
Glucose, Bld: 98 mg/dL (ref 70–99)
Potassium: 4.2 mmol/L (ref 3.5–5.1)
Sodium: 136 mmol/L (ref 135–145)

## 2018-08-23 LAB — CBC WITH DIFFERENTIAL/PLATELET
Abs Immature Granulocytes: 0.01 10*3/uL (ref 0.00–0.07)
Basophils Absolute: 0 10*3/uL (ref 0.0–0.1)
Basophils Relative: 1 %
Eosinophils Absolute: 0.1 10*3/uL (ref 0.0–0.5)
Eosinophils Relative: 1 %
HCT: 44.2 % (ref 39.0–52.0)
Hemoglobin: 14.2 g/dL (ref 13.0–17.0)
Immature Granulocytes: 0 %
Lymphocytes Relative: 16 %
Lymphs Abs: 1 10*3/uL (ref 0.7–4.0)
MCH: 29 pg (ref 26.0–34.0)
MCHC: 32.1 g/dL (ref 30.0–36.0)
MCV: 90.4 fL (ref 80.0–100.0)
Monocytes Absolute: 0.7 10*3/uL (ref 0.1–1.0)
Monocytes Relative: 11 %
Neutro Abs: 4.7 10*3/uL (ref 1.7–7.7)
Neutrophils Relative %: 71 %
Platelets: 168 10*3/uL (ref 150–400)
RBC: 4.89 MIL/uL (ref 4.22–5.81)
RDW: 12.3 % (ref 11.5–15.5)
WBC: 6.6 10*3/uL (ref 4.0–10.5)
nRBC: 0 % (ref 0.0–0.2)

## 2018-08-25 LAB — HM COLONOSCOPY

## 2018-08-29 ENCOUNTER — Ambulatory Visit (HOSPITAL_COMMUNITY): Payer: 59 | Admitting: Registered Nurse

## 2018-08-29 ENCOUNTER — Encounter (HOSPITAL_COMMUNITY)
Admission: RE | Disposition: A | Discharge status: Home / Self Care | Payer: Self-pay | Source: Ambulatory Visit | Attending: Gastroenterology

## 2018-08-29 ENCOUNTER — Encounter (HOSPITAL_COMMUNITY): Payer: Self-pay | Admitting: *Deleted

## 2018-08-29 ENCOUNTER — Ambulatory Visit (HOSPITAL_COMMUNITY)
Admission: RE | Admit: 2018-08-29 | Discharge: 2018-08-29 | Disposition: A | Discharge status: Home / Self Care | Payer: 59 | Source: Ambulatory Visit | Attending: Gastroenterology | Admitting: Gastroenterology

## 2018-08-29 DIAGNOSIS — Z95810 Presence of automatic (implantable) cardiac defibrillator: Secondary | ICD-10-CM | POA: Insufficient documentation

## 2018-08-29 DIAGNOSIS — G4733 Obstructive sleep apnea (adult) (pediatric): Secondary | ICD-10-CM | POA: Diagnosis not present

## 2018-08-29 DIAGNOSIS — I509 Heart failure, unspecified: Secondary | ICD-10-CM | POA: Diagnosis not present

## 2018-08-29 DIAGNOSIS — K648 Other hemorrhoids: Secondary | ICD-10-CM | POA: Insufficient documentation

## 2018-08-29 DIAGNOSIS — I4891 Unspecified atrial fibrillation: Secondary | ICD-10-CM | POA: Insufficient documentation

## 2018-08-29 DIAGNOSIS — Z1211 Encounter for screening for malignant neoplasm of colon: Secondary | ICD-10-CM

## 2018-08-29 DIAGNOSIS — Z79899 Other long term (current) drug therapy: Secondary | ICD-10-CM | POA: Diagnosis not present

## 2018-08-29 DIAGNOSIS — I110 Hypertensive heart disease with heart failure: Secondary | ICD-10-CM | POA: Diagnosis not present

## 2018-08-29 DIAGNOSIS — Q439 Congenital malformation of intestine, unspecified: Secondary | ICD-10-CM | POA: Insufficient documentation

## 2018-08-29 DIAGNOSIS — Z9989 Dependence on other enabling machines and devices: Secondary | ICD-10-CM | POA: Insufficient documentation

## 2018-08-29 DIAGNOSIS — Z6841 Body Mass Index (BMI) 40.0 and over, adult: Secondary | ICD-10-CM | POA: Diagnosis not present

## 2018-08-29 DIAGNOSIS — Z7901 Long term (current) use of anticoagulants: Secondary | ICD-10-CM | POA: Insufficient documentation

## 2018-08-29 DIAGNOSIS — K644 Residual hemorrhoidal skin tags: Secondary | ICD-10-CM | POA: Insufficient documentation

## 2018-08-29 HISTORY — PX: COLONOSCOPY WITH PROPOFOL: SHX5780

## 2018-08-29 SURGERY — COLONOSCOPY WITH PROPOFOL
Anesthesia: Monitor Anesthesia Care

## 2018-08-29 MED ORDER — PROPOFOL 10 MG/ML IV BOLUS
INTRAVENOUS | Status: AC
  Filled 2018-08-29: qty 60

## 2018-08-29 MED ORDER — CHLORHEXIDINE GLUCONATE CLOTH 2 % EX PADS: 6.0000 | MEDICATED_PAD | Freq: Once | CUTANEOUS | Status: DC

## 2018-08-29 MED ORDER — MIDAZOLAM HCL 5 MG/5ML IJ SOLN
INTRAMUSCULAR | Status: DC | PRN
  Administered 2018-08-29: 2 mg via INTRAVENOUS

## 2018-08-29 MED ORDER — PROPOFOL 500 MG/50ML IV EMUL
INTRAVENOUS | Status: DC | PRN
  Administered 2018-08-29: 150 ug/kg/min via INTRAVENOUS

## 2018-08-29 MED ORDER — LACTATED RINGERS IV SOLN
INTRAVENOUS | Status: DC
  Administered 2018-08-29: 1000 mL via INTRAVENOUS

## 2018-08-29 MED ORDER — PROPOFOL 10 MG/ML IV BOLUS
INTRAVENOUS | Status: DC | PRN
  Administered 2018-08-29 (×3): 20 mg via INTRAVENOUS

## 2018-08-29 MED ORDER — MIDAZOLAM HCL 2 MG/2ML IJ SOLN
INTRAMUSCULAR | Status: AC
  Filled 2018-08-29: qty 2

## 2018-08-29 NOTE — Transfer of Care (Signed)
Immediate Anesthesia Transfer of Care Note  Patient: Brandon Torres  Procedure(s) Performed: COLONOSCOPY WITH PROPOFOL (N/A )  Patient Location: PACU  Anesthesia Type:MAC  Level of Consciousness: awake, alert  and oriented  Airway & Oxygen Therapy: Patient Spontanous Breathing  Post-op Assessment: Report given to RN  Post vital signs: Reviewed  Last Vitals:  Vitals Value Taken Time  BP    Temp    Pulse 70 08/29/2018  8:12 AM  Resp 14 08/29/2018  8:12 AM  SpO2 92 % 08/29/2018  8:12 AM  Vitals shown include unvalidated device data.  Last Pain:  Vitals:   08/29/18 0804  TempSrc:   PainSc: 0-No pain      Patients Stated Pain Goal: 6 (44/01/02 7253)  Complications: No apparent anesthesia complications

## 2018-08-29 NOTE — Anesthesia Preprocedure Evaluation (Signed)
Anesthesia Evaluation  Patient identified by MRN, date of birth, ID band Patient awake    Reviewed: Allergy & Precautions, H&P , NPO status , Patient's Chart, lab work & pertinent test results  History of Anesthesia Complications (+) PONV and history of anesthetic complications  Airway Mallampati: II  TM Distance: >3 FB Neck ROM: full    Dental no notable dental hx.    Pulmonary sleep apnea and Continuous Positive Airway Pressure Ventilation ,    Pulmonary exam normal breath sounds clear to auscultation       Cardiovascular Exercise Tolerance: Good hypertension, +CHF  Atrial Fibrillation + Cardiac Defibrillator  Rhythm:regular Rate:Normal     Neuro/Psych negative neurological ROS  negative psych ROS   GI/Hepatic negative GI ROS, Neg liver ROS,   Endo/Other  Morbid obesity  Renal/GU negative Renal ROS  negative genitourinary   Musculoskeletal   Abdominal   Peds  Hematology negative hematology ROS (+)   Anesthesia Other Findings LV EF: 20% -   25%  Reproductive/Obstetrics negative OB ROS                             Anesthesia Physical Anesthesia Plan  ASA: IV  Anesthesia Plan: MAC   Post-op Pain Management:    Induction:   PONV Risk Score and Plan:   Airway Management Planned:   Additional Equipment:   Intra-op Plan:   Post-operative Plan:   Informed Consent: I have reviewed the patients History and Physical, chart, labs and discussed the procedure including the risks, benefits and alternatives for the proposed anesthesia with the patient or authorized representative who has indicated his/her understanding and acceptance.   Dental Advisory Given  Plan Discussed with: CRNA  Anesthesia Plan Comments:         Anesthesia Quick Evaluation

## 2018-08-29 NOTE — Op Note (Signed)
[facility] Patient Name: Brandon Torres Procedure Date: 08/29/2018 7:19 AM MRN: [id number] Date of Birth: 10-Feb-1966 Attending MD: Jonette Eva MD, MD CSN: [id number] Age: 52 Admit Type: Outpatient Procedure:                Colonoscopy, SCREENING Indications:              Screening for colorectal malignant neoplasm Providers:                Jonette Eva MD, MD, Edrick Kins, RN Referring MD:             Oneal Deputy. Juanetta Gosling MD, MD Medicines:                Propofol per Anesthesia Complications:            No immediate complications. Estimated Blood Loss:     Estimated blood loss: none. Procedure:                Pre-Anesthesia Assessment:                           - Prior to the procedure, a History and Physical                            was performed, and patient medications and                            allergies were reviewed. The patient's tolerance of                            previous anesthesia was also reviewed. The risks                            and benefits of the procedure and the sedation                            options and risks were discussed with the patient.                            All questions were answered, and informed consent                            was obtained. Prior Anticoagulants: The patient has                            taken Xarelto (rivaroxaban), last dose was 2 days                            prior to procedure. ASA Grade Assessment: II - A                            patient with mild systemic disease. After reviewing                            the risks and benefits, the patient was deemed in  satisfactory condition to undergo the procedure.                            After obtaining informed consent, the colonoscope                            was passed under direct vision. Throughout the                            procedure, the patient's blood pressure, pulse, and                            oxygen saturations  were monitored continuously. The                            CF-[id number] ([id number]) scope was introduced through                            the anus and advanced to the the cecum, identified                            by appendiceal orifice and ileocecal valve. The                            ileocecal valve, appendiceal orifice, and rectum                            were photographed. The colonoscopy was somewhat                            difficult due to a tortuous colon. Successful                            completion of the procedure was aided by                            straightening and shortening the scope to obtain                            bowel loop reduction and COLOWRAP. The patient                            tolerated the procedure well. The quality of the                            bowel preparation was excellent. Scope In: 7:47:00 AM Scope Out: 8:00:36 AM Scope Withdrawal Time: 0 hours 10 minutes 2 seconds  Total Procedure Duration: 0 hours 13 minutes 36 seconds  Findings:      The recto-sigmoid colon and sigmoid colon were mildly tortuous.      External and internal hemorrhoids were found. The hemorrhoids were large.      The exam was otherwise without abnormality. Impression:               - Tortuous LEFT colon.                           -  External and internal hemorrhoids.                           - The examination was otherwise normal. Moderate Sedation:      Per Anesthesia Care Recommendation:           - Patient has a contact number available for                            emergencies. The signs and symptoms of potential                            delayed complications were discussed with the                            patient. Return to normal activities tomorrow.                            Written discharge instructions were provided to the                            patient.                           - High fiber diet.                           - Continue  present medications.                           - Repeat colonoscopy in 10 years for surveillance. Procedure Code(s):        --- Professional ---                           [phone], Colonoscopy, flexible; diagnostic, including                            collection of specimen(s) by brushing or washing,                            when performed (separate procedure) Diagnosis Code(s):        --- Professional ---                           Z12.11, Encounter for screening for malignant                            neoplasm of colon                           K64.8, Other hemorrhoids                           Q43.8, Other specified congenital malformations of                            intestine CPT copyright 2018 American Medical Association. All  rights reserved. The codes documented in this report are preliminary and upon coder review may  be revised to meet current compliance requirements. Jonette Eva, MD Jonette Eva MD, MD 08/29/2018 8:12:44 AM This report has been signed electronically. Number of Addenda: 0
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
## 2018-08-29 NOTE — Anesthesia Postprocedure Evaluation (Signed)
Anesthesia Post Note  Patient: Brandon Torres  Procedure(s) Performed: COLONOSCOPY WITH PROPOFOL (N/A )  Patient location during evaluation: PACU Anesthesia Type: MAC Level of consciousness: awake and alert and oriented Pain management: pain level controlled Vital Signs Assessment: post-procedure vital signs reviewed and stable Respiratory status: spontaneous breathing Cardiovascular status: blood pressure returned to baseline Postop Assessment: no apparent nausea or vomiting Anesthetic complications: no     Last Vitals:  Vitals:   08/29/18 0650  BP: (!) 96/55  Resp: (!) 22  Temp: 37 C  SpO2: 96%    Last Pain:  Vitals:   08/29/18 0804  TempSrc:   PainSc: 0-No pain                 Anastazja Isaac

## 2018-08-29 NOTE — Discharge Instructions (Signed)
You have moderate size internal hemorrhoids. YOU DID NOT HAVE ANY POLYPS.   Re-start Xarelto today.  DRINK WATER TO KEEP YOUR URINE LIGHT YELLOW.  CONTINUE YOUR WEIGHT LOSS EFFORTS. YOUR BODY MASS INDEX IS OVER 40 WHICH MEANS YOU ARE MORBIDLY OBESE. OBESITY IS ASSOCIATED WITH AN INCREASED FOR CIRRHOSIS AND ALL CANCERS, INCLUDING ESOPHAGEAL AND COLON CANCER. A BMI OVER 40 SHORTENS YOUR LIFE EXPECTANCY 10 YEARS. A WEIGHT OF 290 LBS OR LESS WILL GET YOUR BODY MASS INDEX(BMI) UNDER 40 BUT YOUR BODY MASS INDEX WILL STILL BE OVER 30 WHICH MEANS YOU ARE OBESE.  A WEIGHT OF 220 LBS OR LESS  WILL GET YOUR BODY MASS INDEX(BMI) UNDER 30.  FOLLOW A HIGH FIBER DIET. AVOID ITEMS THAT CAUSE BLOATING. SEE INFO BELOW.  USE PREPARATION H FOUR TIMES  A DAY IF NEEDED TO RELIEVE RECTAL PAIN/PRESSURE/BLEEDING.  Next colonoscopy in 10 years.  Colonoscopy Care After Read the instructions outlined below and refer to this sheet in the next week. These discharge instructions provide you with general information on caring for yourself after you leave the hospital. While your treatment has been planned according to the most current medical practices available, unavoidable complications occasionally occur. If you have any problems or questions after discharge, call DR. Cornelius Marullo, [phone].  ACTIVITY  You may resume your regular activity, but move at a slower pace for the next 24 hours.   Take frequent rest periods for the next 24 hours.   Walking will help get rid of the air and reduce the bloated feeling in your belly (abdomen).   No driving for 24 hours (because of the medicine (anesthesia) used during the test).   You may shower.   Do not sign any important legal documents or operate any machinery for 24 hours (because of the anesthesia used during the test).    NUTRITION  Drink plenty of fluids.   You may resume your normal diet as instructed by your doctor.   Begin with a light meal and progress to  your normal diet. Heavy or fried foods are harder to digest and may make you feel sick to your stomach (nauseated).   Avoid alcoholic beverages for 24 hours or as instructed.    MEDICATIONS  You may resume your normal medications.   WHAT YOU CAN EXPECT TODAY  Some feelings of bloating in the abdomen.   Passage of more gas than usual.   Spotting of blood in your stool or on the toilet paper  .  IF YOU HAD POLYPS REMOVED DURING THE COLONOSCOPY:  Eat a soft diet IF YOU HAVE NAUSEA, BLOATING, ABDOMINAL PAIN, OR VOMITING.    FINDING OUT THE RESULTS OF YOUR TEST Not all test results are available during your visit. DR. Darrick Penna WILL CALL YOU WITHIN 14 DAYS OF YOUR PROCEDUE WITH YOUR RESULTS. Do not assume everything is normal if you have not heard from DR. Jatinder Mcdonagh, CALL HER OFFICE AT [phone].  SEEK IMMEDIATE MEDICAL ATTENTION AND CALL THE OFFICE: [phone] IF:  You have more than a spotting of blood in your stool.   Your belly is swollen (abdominal distention).   You are nauseated or vomiting.   You have a temperature over 101F.   You have abdominal pain or discomfort that is severe or gets worse throughout the day.  High-Fiber Diet A high-fiber diet changes your normal diet to include more whole grains, legumes, fruits, and vegetables. Changes in the diet involve replacing refined carbohydrates with unrefined foods. The calorie level of  the diet is essentially unchanged. The Dietary Reference Intake (recommended amount) for adult males is 38 grams per day. For adult females, it is 25 grams per day. Pregnant and lactating women should consume 28 grams of fiber per day. Fiber is the intact part of a plant that is not broken down during digestion. Functional fiber is fiber that has been isolated from the plant to provide a beneficial effect in the body. PURPOSE  Increase stool bulk.   Ease and regulate bowel movements.   Lower cholesterol.   REDUCE RISK OF COLON  CANCER  INDICATIONS THAT YOU NEED MORE FIBER  Constipation and hemorrhoids.   Uncomplicated diverticulosis (intestine condition) and irritable bowel syndrome.   Weight management.   As a protective measure against hardening of the arteries (atherosclerosis), diabetes, and cancer.   GUIDELINES FOR INCREASING FIBER IN THE DIET  Start adding fiber to the diet slowly. A gradual increase of about 5 more grams (2 slices of whole-wheat bread, 2 servings of most fruits or vegetables, or 1 bowl of high-fiber cereal) per day is best. Too rapid an increase in fiber may result in constipation, flatulence, and bloating.   Drink enough water and fluids to keep your urine clear or pale yellow. Water, juice, or caffeine-free drinks are recommended. Not drinking enough fluid may cause constipation.   Eat a variety of high-fiber foods rather than one type of fiber.   Try to increase your intake of fiber through using high-fiber foods rather than fiber pills or supplements that contain small amounts of fiber.   The goal is to change the types of food eaten. Do not supplement your present diet with high-fiber foods, but replace foods in your present diet.    INCLUDE A VARIETY OF FIBER SOURCES  Replace refined and processed grains with whole grains, canned fruits with fresh fruits, and incorporate other fiber sources. White rice, white breads, and most bakery goods contain little or no fiber.   Brown whole-grain rice, buckwheat oats, and many fruits and vegetables are all good sources of fiber. These include: broccoli, Brussels sprouts, cabbage, cauliflower, beets, sweet potatoes, white potatoes (skin on), carrots, tomatoes, eggplant, squash, berries, fresh fruits, and dried fruits.   Cereals appear to be the richest source of fiber. Cereal fiber is found in whole grains and bran. Bran is the fiber-rich outer coat of cereal grain, which is largely removed in refining. In whole-grain cereals, the bran  remains. In breakfast cereals, the largest amount of fiber is found in those with "bran" in their names. The fiber content is sometimes indicated on the label.   You may need to include additional fruits and vegetables each day.   In baking, for 1 cup white flour, you may use the following substitutions:   1 cup whole-wheat flour minus 2 tablespoons.   1/2 cup white flour plus 1/2 cup whole-wheat flour.   Hemorrhoids Hemorrhoids are dilated (enlarged) veins around the rectum. Sometimes clots will form in the veins. This makes them swollen and painful. These are called thrombosed hemorrhoids. Causes of hemorrhoids include:  Constipation.   Straining to have a bowel movement.   HEAVY LIFTING  HOME CARE INSTRUCTIONS  Eat a well balanced diet and drink 6 to 8 glasses of water every day to avoid constipation. You may also use a bulk laxative.   Avoid straining to have bowel movements.   Keep anal area dry and clean.   Do not use a donut shaped pillow or sit on the toilet  for long periods. This increases blood pooling and pain.   Move your bowels when your body has the urge; this will require less straining and will decrease pain and pressure.

## 2018-09-05 ENCOUNTER — Encounter (HOSPITAL_COMMUNITY): Payer: Self-pay | Admitting: Gastroenterology

## 2018-09-09 ENCOUNTER — Other Ambulatory Visit: Payer: Self-pay | Admitting: Cardiology

## 2018-10-10 ENCOUNTER — Other Ambulatory Visit: Payer: Self-pay | Admitting: Cardiology

## 2018-10-14 LAB — CUP PACEART REMOTE DEVICE CHECK
Battery Remaining Longevity: 70 mo
Battery Remaining Percentage: 77 %
Battery Voltage: 2.98 V
Date Time Interrogation Session: 20191014102850
HighPow Impedance: 69 Ohm
HighPow Impedance: 69 Ohm
Implantable Lead Implant Date: 20160629
Implantable Lead Implant Date: 20160629
Implantable Lead Implant Date: 20180416
Implantable Lead Location: 753858
Implantable Lead Location: 753859
Implantable Lead Location: 753860
Implantable Lead Model: 7122
Implantable Pulse Generator Implant Date: 20180416
Lead Channel Impedance Value: 380 Ohm
Lead Channel Impedance Value: 450 Ohm
Lead Channel Impedance Value: 540 Ohm
Lead Channel Pacing Threshold Amplitude: 0.5 V
Lead Channel Pacing Threshold Amplitude: 0.625 V
Lead Channel Pacing Threshold Pulse Width: 0.5 ms
Lead Channel Pacing Threshold Pulse Width: 0.5 ms
Lead Channel Sensing Intrinsic Amplitude: 0.4 mV
Lead Channel Sensing Intrinsic Amplitude: 8 mV
Lead Channel Setting Pacing Amplitude: 2 V
Lead Channel Setting Pacing Amplitude: 2 V
Lead Channel Setting Pacing Pulse Width: 0.5 ms
Lead Channel Setting Pacing Pulse Width: 0.5 ms
Lead Channel Setting Sensing Sensitivity: 0.5 mV
Pulse Gen Serial Number: 7413271

## 2018-10-20 ENCOUNTER — Telehealth: Payer: Self-pay | Admitting: *Deleted

## 2018-10-20 NOTE — Telephone Encounter (Signed)
Received fax from [facility] - Entresto 97/103mg  approved through 10/20/2019.

## 2018-11-06 ENCOUNTER — Ambulatory Visit (INDEPENDENT_AMBULATORY_CARE_PROVIDER_SITE_OTHER): Payer: 59

## 2018-11-06 DIAGNOSIS — I428 Other cardiomyopathies: Secondary | ICD-10-CM | POA: Diagnosis not present

## 2018-11-06 DIAGNOSIS — I429 Cardiomyopathy, unspecified: Secondary | ICD-10-CM

## 2018-11-06 DIAGNOSIS — I5022 Chronic systolic (congestive) heart failure: Secondary | ICD-10-CM

## 2018-11-07 NOTE — Progress Notes (Signed)
Remote ICD transmission.   

## 2018-11-10 LAB — CUP PACEART REMOTE DEVICE CHECK
Battery Remaining Longevity: 66 mo
Battery Remaining Percentage: 74 %
Battery Voltage: 2.96 V
Date Time Interrogation Session: 20200113121217
HighPow Impedance: 73 Ohm
HighPow Impedance: 73 Ohm
Implantable Lead Implant Date: 20160629
Implantable Lead Implant Date: 20160629
Implantable Lead Implant Date: 20180416
Implantable Lead Location: 753858
Implantable Lead Location: 753859
Implantable Lead Location: 753860
Implantable Lead Model: 7122
Implantable Pulse Generator Implant Date: 20180416
Lead Channel Impedance Value: 390 Ohm
Lead Channel Impedance Value: 430 Ohm
Lead Channel Impedance Value: 540 Ohm
Lead Channel Pacing Threshold Amplitude: 0.5 V
Lead Channel Pacing Threshold Amplitude: 0.5 V
Lead Channel Pacing Threshold Pulse Width: 0.5 ms
Lead Channel Pacing Threshold Pulse Width: 0.5 ms
Lead Channel Sensing Intrinsic Amplitude: 0.4 mV
Lead Channel Sensing Intrinsic Amplitude: 9.4 mV
Lead Channel Setting Pacing Amplitude: 2 V
Lead Channel Setting Pacing Amplitude: 2 V
Lead Channel Setting Pacing Pulse Width: 0.5 ms
Lead Channel Setting Pacing Pulse Width: 0.5 ms
Lead Channel Setting Sensing Sensitivity: 0.5 mV
Pulse Gen Serial Number: 7413271

## 2018-11-22 DIAGNOSIS — J209 Acute bronchitis, unspecified: Secondary | ICD-10-CM | POA: Diagnosis not present

## 2018-11-22 DIAGNOSIS — I5022 Chronic systolic (congestive) heart failure: Secondary | ICD-10-CM | POA: Diagnosis not present

## 2018-11-22 DIAGNOSIS — I482 Chronic atrial fibrillation, unspecified: Secondary | ICD-10-CM | POA: Diagnosis not present

## 2018-12-08 ENCOUNTER — Other Ambulatory Visit: Payer: Self-pay | Admitting: Cardiology

## 2018-12-20 ENCOUNTER — Encounter: Payer: Self-pay | Admitting: Cardiology

## 2018-12-20 ENCOUNTER — Ambulatory Visit (INDEPENDENT_AMBULATORY_CARE_PROVIDER_SITE_OTHER): Payer: Medicare Other | Admitting: Cardiology

## 2018-12-20 VITALS — BP 90/60 | HR 72 | Ht 72.0 in | Wt 330.0 lb

## 2018-12-20 DIAGNOSIS — I472 Ventricular tachycardia, unspecified: Secondary | ICD-10-CM

## 2018-12-20 DIAGNOSIS — I4891 Unspecified atrial fibrillation: Secondary | ICD-10-CM | POA: Diagnosis not present

## 2018-12-20 DIAGNOSIS — I5022 Chronic systolic (congestive) heart failure: Secondary | ICD-10-CM

## 2018-12-20 NOTE — Patient Instructions (Signed)
Your physician recommends that you schedule a follow-up appointment in: 4 MONTHS WITH DR BRANCH  Your physician recommends that you continue on your current medications as directed. Please refer to the Current Medication list given to you today.  Thank you for choosing [facility]!!    
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
## 2018-12-26 ENCOUNTER — Other Ambulatory Visit: Payer: Self-pay | Admitting: Cardiology

## 2019-01-04 ENCOUNTER — Other Ambulatory Visit: Payer: Self-pay | Admitting: Cardiology

## 2019-01-25 ENCOUNTER — Other Ambulatory Visit: Payer: Self-pay | Admitting: Cardiology

## 2019-02-05 ENCOUNTER — Other Ambulatory Visit: Payer: Self-pay

## 2019-02-05 ENCOUNTER — Ambulatory Visit (INDEPENDENT_AMBULATORY_CARE_PROVIDER_SITE_OTHER): Payer: Medicare Other | Admitting: *Deleted

## 2019-02-05 DIAGNOSIS — I4891 Unspecified atrial fibrillation: Secondary | ICD-10-CM | POA: Diagnosis not present

## 2019-02-05 DIAGNOSIS — I472 Ventricular tachycardia, unspecified: Secondary | ICD-10-CM

## 2019-02-05 LAB — CUP PACEART REMOTE DEVICE CHECK
Date Time Interrogation Session: 20200413180827
Implantable Lead Implant Date: 20160629
Implantable Lead Implant Date: 20160629
Implantable Lead Implant Date: 20180416
Implantable Lead Location: 753858
Implantable Lead Location: 753859
Implantable Lead Location: 753860
Implantable Lead Model: 7122
Implantable Pulse Generator Implant Date: 20180416
Pulse Gen Serial Number: 7413271

## 2019-02-09 ENCOUNTER — Encounter: Payer: Self-pay | Admitting: *Deleted

## 2019-02-16 NOTE — Progress Notes (Signed)
Remote ICD transmission.   

## 2019-03-23 DIAGNOSIS — I10 Essential (primary) hypertension: Secondary | ICD-10-CM | POA: Diagnosis not present

## 2019-03-23 DIAGNOSIS — J309 Allergic rhinitis, unspecified: Secondary | ICD-10-CM | POA: Diagnosis not present

## 2019-03-23 DIAGNOSIS — R05 Cough: Secondary | ICD-10-CM | POA: Diagnosis not present

## 2019-03-23 DIAGNOSIS — I482 Chronic atrial fibrillation, unspecified: Secondary | ICD-10-CM | POA: Diagnosis not present

## 2019-04-04 ENCOUNTER — Other Ambulatory Visit: Payer: Self-pay | Admitting: Cardiology

## 2019-04-12 ENCOUNTER — Telehealth: Payer: Self-pay | Admitting: *Deleted

## 2019-04-12 NOTE — Telephone Encounter (Signed)
..   Virtual Visit Pre-Appointment Phone Call  "(Name), I am calling you today to discuss your upcoming appointment. We are currently trying to limit exposure to the virus that causes COVID-19 by seeing patients at home rather than in the office."  1. "What is the BEST phone number to call the day of the visit?" - include this in appointment notes  2. "Do you have or have access to (through a family member/friend) a smartphone with video capability that we can use for your visit?" a. If yes - list this number in appt notes as "cell" (if different from BEST phone #) and list the appointment type as a VIDEO visit in appointment notes b. If no - list the appointment type as a PHONE visit in appointment notes  Confirm consent - "In the setting of the current Covid19 crisis, you are scheduled for a (phone or video) visit with your provider on (date) at (time).  Just as we do with many in-office visits, in order for you to participate in this visit, we must obtain consent.  If you'd like, I can send this to your mychart (if signed up) or email for you to review.  Otherwise, I can obtain your verbal consent now.  All virtual visits are billed to your insurance company just like a normal visit would be.  By agreeing to a virtual visit, we'd like you to understand that the technology does not allow for your provider to perform an examination, and thus may limit your provider's ability to fully assess your condition. If your provider identifies any concerns that need to be evaluated in person, we will make arrangements to do so.  Finally, though the technology is pretty good, we cannot assure that it will always work on either your or our end, and in the setting of a video visit, we may have to convert it to a phone-only visit.  In either situation, we cannot ensure that we have a secure connection.  Are you willing to proceed?" STAFF: Did the patient verbally acknowledge consent to telehealth visit? Document  YES/NO here: YES  3. Advise patient to be prepared - "Two hours prior to your appointment, go ahead and check your blood pressure, pulse, oxygen saturation, and your weight (if you have the equipment to check those) and write them all down. When your visit starts, your provider will ask you for this information. If you have an Apple Watch or Kardia device, please plan to have heart rate information ready on the day of your appointment. Please have a pen and paper handy nearby the day of the visit as well."  4. Give patient instructions for MyChart download to smartphone OR [email or web address] as below if video visit (depending on what platform provider is using)EXPLAINED PROCESS TO PATIENT AND HE STATED HIS UNDERSTANDING  5. Inform patient they will receive a phone call 15 minutes prior to their appointment time (may be from unknown caller ID) so they should be prepared to answer    TELEPHONE CALL NOTE  Brandon Torres has been deemed a candidate for a follow-up tele-health visit to limit community exposure during the Covid-19 pandemic. I spoke with the patient via phone to ensure availability of phone/video source, confirm preferred email & phone number, and discuss instructions and expectations.  I reminded Brandon Torres to be prepared with any vital sign and/or heart rhythm information that could potentially be obtained via home monitoring, at the time of his visit. I reminded Brandon Torres to expect a phone call prior to his visit.  Brandon Torres, CMA 04/12/2019 5:27 PM   INSTRUCTIONS FOR DOWNLOADING THE MYCHART APP TO SMARTPHONE  - The patient must first make sure to have activated MyChart and know their login information - If Apple, go to [employer] and type in MyChart in the search bar and download the app. If Android, ask patient to go to [employer] and type in [place] in the search bar and download the app. The app is free but as with any other app downloads, their phone may  require them to verify saved payment information or Apple/Android password.  - The patient will need to then log into the app with their MyChart username and password, and select [facility] as their healthcare provider to link the account. When it is time for your visit, go to the MyChart app, find appointments, and click Begin Video Visit. Be sure to Select Allow for your device to access the Microphone and Camera for your visit. You will then be connected, and your provider will be with you shortly.  **If they have any issues connecting, or need assistance please contact MyChart service desk [phone])**  **If using a computer, in order to ensure the best quality for their visit they will need to use either of the following Internet Browsers: [employer], or Google Chrome**  IF USING DOXIMITY or DOXY.ME - The patient will receive a link just prior to their visit by text.     FULL LENGTH CONSENT FOR TELE-HEALTH VISIT   I hereby voluntarily request, consent and authorize [facility] and its employed or contracted physicians, physician assistants, nurse practitioners or other licensed health care professionals (the Practitioner), to provide me with telemedicine health care services (the "Services") as deemed necessary by the treating Practitioner. I acknowledge and consent to receive the Services by the Practitioner via telemedicine. I understand that the telemedicine visit will involve communicating with the Practitioner through live audiovisual communication technology and the disclosure of certain medical information by electronic transmission. I acknowledge that I have been given the opportunity to request an in-person assessment or other available alternative prior to the telemedicine visit and am voluntarily participating in the telemedicine visit.  I understand that I have the right to withhold or withdraw my consent to the use of telemedicine in the course of my care at  any time, without affecting my right to future care or treatment, and that the Practitioner or I may terminate the telemedicine visit at any time. I understand that I have the right to inspect all information obtained and/or recorded in the course of the telemedicine visit and may receive copies of available information for a reasonable fee.  I understand that some of the potential risks of receiving the Services via telemedicine include:  Marland Kitchen Delay or interruption in medical evaluation due to technological equipment failure or disruption; . Information transmitted may not be sufficient (e.g. poor resolution of images) to allow for appropriate medical decision making by the Practitioner; and/or  . In rare instances, security protocols could fail, causing a breach of personal health information.  Furthermore, I acknowledge that it is my responsibility to provide information about my medical history, conditions and care that is complete and accurate to the best of my ability. I acknowledge that Practitioner's advice, recommendations, and/or decision may be based on factors not within their control, such as incomplete or inaccurate data provided by me or distortions of diagnostic images  or specimens that may result from electronic transmissions. I understand that the practice of medicine is not an exact science and that Practitioner makes no warranties or guarantees regarding treatment outcomes. I acknowledge that I will receive a copy of this consent concurrently upon execution via email to the email address I last provided but may also request a printed copy by calling the office of [facility].    I understand that my insurance will be billed for this visit.   I have read or had this consent read to me. . I understand the contents of this consent, which adequately explains the benefits and risks of the Services being provided via telemedicine.  . I have been provided ample opportunity to ask questions  regarding this consent and the Services and have had my questions answered to my satisfaction. . I give my informed consent for the services to be provided through the use of telemedicine in my medical care  By participating in this telemedicine visit I agree to the above.

## 2019-04-18 ENCOUNTER — Telehealth: Payer: Self-pay | Admitting: Internal Medicine

## 2019-04-18 NOTE — Telephone Encounter (Signed)
New Message    Called and left a voicemail for pt to call and confirm appt  Consent is in Epic

## 2019-04-19 ENCOUNTER — Telehealth (INDEPENDENT_AMBULATORY_CARE_PROVIDER_SITE_OTHER): Payer: Medicare Other | Admitting: Internal Medicine

## 2019-04-19 ENCOUNTER — Other Ambulatory Visit: Payer: Self-pay

## 2019-04-19 DIAGNOSIS — I5022 Chronic systolic (congestive) heart failure: Secondary | ICD-10-CM

## 2019-04-19 DIAGNOSIS — Z95810 Presence of automatic (implantable) cardiac defibrillator: Secondary | ICD-10-CM

## 2019-04-19 DIAGNOSIS — I4891 Unspecified atrial fibrillation: Secondary | ICD-10-CM | POA: Diagnosis not present
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
## 2019-04-23 ENCOUNTER — Telehealth (INDEPENDENT_AMBULATORY_CARE_PROVIDER_SITE_OTHER): Payer: Medicare Other | Admitting: Cardiology

## 2019-04-23 ENCOUNTER — Encounter: Payer: Self-pay | Admitting: *Deleted

## 2019-04-23 ENCOUNTER — Encounter: Payer: Self-pay | Admitting: Cardiology

## 2019-04-23 VITALS — BP 100/56 | HR 65 | Ht 72.0 in | Wt 323.0 lb

## 2019-04-23 DIAGNOSIS — I4819 Other persistent atrial fibrillation: Secondary | ICD-10-CM

## 2019-04-23 DIAGNOSIS — I5022 Chronic systolic (congestive) heart failure: Secondary | ICD-10-CM

## 2019-04-23 DIAGNOSIS — I4891 Unspecified atrial fibrillation: Secondary | ICD-10-CM | POA: Diagnosis not present

## 2019-04-23 DIAGNOSIS — I472 Ventricular tachycardia, unspecified: Secondary | ICD-10-CM

## 2019-04-23 NOTE — Patient Instructions (Signed)
Your physician recommends that you schedule a follow-up appointment in: 3 MONTHS WITH DR BRANCH  Your physician recommends that you continue on your current medications as directed. Please refer to the Current Medication list given to you today.  Thank you for choosing [facility]!!    
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
## 2019-04-24 DIAGNOSIS — M720 Palmar fascial fibromatosis [Dupuytren]: Secondary | ICD-10-CM | POA: Diagnosis not present

## 2019-05-07 ENCOUNTER — Ambulatory Visit (INDEPENDENT_AMBULATORY_CARE_PROVIDER_SITE_OTHER): Payer: Medicare Other | Admitting: *Deleted

## 2019-05-07 DIAGNOSIS — I5022 Chronic systolic (congestive) heart failure: Secondary | ICD-10-CM | POA: Diagnosis not present

## 2019-05-07 DIAGNOSIS — I428 Other cardiomyopathies: Secondary | ICD-10-CM

## 2019-05-07 DIAGNOSIS — I429 Cardiomyopathy, unspecified: Secondary | ICD-10-CM

## 2019-05-07 LAB — CUP PACEART REMOTE DEVICE CHECK
Battery Remaining Longevity: 61 mo
Battery Remaining Percentage: 68 %
Battery Voltage: 2.96 V
Date Time Interrogation Session: 20200713111238
HighPow Impedance: 66 Ohm
HighPow Impedance: 66 Ohm
Implantable Lead Implant Date: 20160629
Implantable Lead Implant Date: 20160629
Implantable Lead Implant Date: 20180416
Implantable Lead Location: 753858
Implantable Lead Location: 753859
Implantable Lead Location: 753860
Implantable Lead Model: 7122
Implantable Pulse Generator Implant Date: 20180416
Lead Channel Impedance Value: 390 Ohm
Lead Channel Impedance Value: 410 Ohm
Lead Channel Impedance Value: 490 Ohm
Lead Channel Pacing Threshold Amplitude: 0.5 V
Lead Channel Pacing Threshold Amplitude: 0.5 V
Lead Channel Pacing Threshold Pulse Width: 0.5 ms
Lead Channel Pacing Threshold Pulse Width: 0.5 ms
Lead Channel Sensing Intrinsic Amplitude: 0.2 mV
Lead Channel Sensing Intrinsic Amplitude: 6.4 mV
Lead Channel Setting Pacing Amplitude: 2 V
Lead Channel Setting Pacing Amplitude: 2 V
Lead Channel Setting Pacing Pulse Width: 0.5 ms
Lead Channel Setting Pacing Pulse Width: 0.5 ms
Lead Channel Setting Sensing Sensitivity: 0.5 mV
Pulse Gen Serial Number: 7413271

## 2019-05-21 NOTE — Progress Notes (Signed)
Remote ICD transmission.   

## 2019-06-26 DIAGNOSIS — Z23 Encounter for immunization: Secondary | ICD-10-CM | POA: Diagnosis not present

## 2019-06-26 DIAGNOSIS — E669 Obesity, unspecified: Secondary | ICD-10-CM | POA: Diagnosis not present

## 2019-06-26 DIAGNOSIS — I5022 Chronic systolic (congestive) heart failure: Secondary | ICD-10-CM | POA: Diagnosis not present

## 2019-06-26 DIAGNOSIS — G4733 Obstructive sleep apnea (adult) (pediatric): Secondary | ICD-10-CM | POA: Diagnosis not present

## 2019-06-26 DIAGNOSIS — I482 Chronic atrial fibrillation, unspecified: Secondary | ICD-10-CM | POA: Diagnosis not present

## 2019-07-02 ENCOUNTER — Other Ambulatory Visit: Payer: Self-pay | Admitting: Cardiology

## 2019-07-18 ENCOUNTER — Encounter: Payer: Self-pay | Admitting: *Deleted

## 2019-07-18 ENCOUNTER — Ambulatory Visit (INDEPENDENT_AMBULATORY_CARE_PROVIDER_SITE_OTHER): Payer: Medicare Other | Admitting: Cardiology

## 2019-07-18 ENCOUNTER — Other Ambulatory Visit: Payer: Self-pay

## 2019-07-18 ENCOUNTER — Encounter: Payer: Self-pay | Admitting: Cardiology

## 2019-07-18 VITALS — BP 94/62 | HR 68 | Temp 97.8°F | Ht 72.0 in | Wt 318.2 lb

## 2019-07-18 DIAGNOSIS — I4891 Unspecified atrial fibrillation: Secondary | ICD-10-CM

## 2019-07-18 DIAGNOSIS — I472 Ventricular tachycardia, unspecified: Secondary | ICD-10-CM

## 2019-07-18 DIAGNOSIS — I5022 Chronic systolic (congestive) heart failure: Secondary | ICD-10-CM | POA: Diagnosis not present

## 2019-07-18 NOTE — Patient Instructions (Signed)
Your physician recommends that you schedule a follow-up appointment in: 4 MONTHS WITH DR BRANCH  Your physician recommends that you continue on your current medications as directed. Please refer to the Current Medication list given to you today.  Thank you for choosing [facility]!!    
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
## 2019-07-24 ENCOUNTER — Other Ambulatory Visit: Payer: Self-pay

## 2019-07-24 ENCOUNTER — Ambulatory Visit (INDEPENDENT_AMBULATORY_CARE_PROVIDER_SITE_OTHER): Payer: Medicare Other | Admitting: Family Medicine

## 2019-07-24 ENCOUNTER — Encounter: Payer: Self-pay | Admitting: Family Medicine

## 2019-07-24 VITALS — BP 95/63 | HR 63 | Temp 98.3°F | Ht 72.0 in | Wt 316.6 lb

## 2019-07-24 DIAGNOSIS — M51369 Other intervertebral disc degeneration, lumbar region without mention of lumbar back pain or lower extremity pain: Secondary | ICD-10-CM

## 2019-07-24 DIAGNOSIS — I4891 Unspecified atrial fibrillation: Secondary | ICD-10-CM | POA: Diagnosis not present

## 2019-07-24 DIAGNOSIS — Z7901 Long term (current) use of anticoagulants: Secondary | ICD-10-CM | POA: Diagnosis not present

## 2019-07-24 DIAGNOSIS — M5136 Other intervertebral disc degeneration, lumbar region: Secondary | ICD-10-CM | POA: Diagnosis not present

## 2019-07-24 DIAGNOSIS — E785 Hyperlipidemia, unspecified: Secondary | ICD-10-CM | POA: Diagnosis not present

## 2019-07-24 DIAGNOSIS — G4730 Sleep apnea, unspecified: Secondary | ICD-10-CM | POA: Diagnosis not present

## 2019-07-24 DIAGNOSIS — M720 Palmar fascial fibromatosis [Dupuytren]: Secondary | ICD-10-CM | POA: Diagnosis not present
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
## 2019-07-24 NOTE — Patient Instructions (Addendum)
Follow up in 1 year for blood work-fasting labwork

## 2019-07-26 ENCOUNTER — Other Ambulatory Visit: Payer: Self-pay | Admitting: Cardiology

## 2019-08-03 DIAGNOSIS — G4730 Sleep apnea, unspecified: Secondary | ICD-10-CM

## 2019-08-03 NOTE — Assessment & Plan Note (Signed)
Can't tolerate cpap

## 2019-08-07 ENCOUNTER — Ambulatory Visit (INDEPENDENT_AMBULATORY_CARE_PROVIDER_SITE_OTHER): Payer: Medicare Other | Admitting: *Deleted

## 2019-08-07 DIAGNOSIS — I4819 Other persistent atrial fibrillation: Secondary | ICD-10-CM

## 2019-08-07 DIAGNOSIS — I5022 Chronic systolic (congestive) heart failure: Secondary | ICD-10-CM | POA: Diagnosis not present

## 2019-08-07 LAB — CUP PACEART REMOTE DEVICE CHECK
Battery Remaining Longevity: 59 mo
Battery Remaining Percentage: 65 %
Battery Voltage: 2.95 V
Date Time Interrogation Session: 20201012144907
HighPow Impedance: 62 Ohm
HighPow Impedance: 62 Ohm
Implantable Lead Implant Date: 20160629
Implantable Lead Implant Date: 20160629
Implantable Lead Implant Date: 20180416
Implantable Lead Location: 753858
Implantable Lead Location: 753859
Implantable Lead Location: 753860
Implantable Lead Model: 7122
Implantable Pulse Generator Implant Date: 20180416
Lead Channel Impedance Value: 410 Ohm
Lead Channel Impedance Value: 410 Ohm
Lead Channel Impedance Value: 510 Ohm
Lead Channel Pacing Threshold Amplitude: 0.5 V
Lead Channel Pacing Threshold Amplitude: 0.5 V
Lead Channel Pacing Threshold Pulse Width: 0.5 ms
Lead Channel Pacing Threshold Pulse Width: 0.5 ms
Lead Channel Sensing Intrinsic Amplitude: 0.2 mV
Lead Channel Sensing Intrinsic Amplitude: 7.5 mV
Lead Channel Setting Pacing Amplitude: 2 V
Lead Channel Setting Pacing Amplitude: 2 V
Lead Channel Setting Pacing Pulse Width: 0.5 ms
Lead Channel Setting Pacing Pulse Width: 0.5 ms
Lead Channel Setting Sensing Sensitivity: 0.5 mV
Pulse Gen Serial Number: 7413271

## 2019-08-17 NOTE — Progress Notes (Signed)
Remote ICD transmission.   

## 2019-09-25 ENCOUNTER — Other Ambulatory Visit: Payer: Self-pay | Admitting: Cardiology

## 2019-09-25 ENCOUNTER — Telehealth: Payer: Self-pay | Admitting: *Deleted

## 2019-09-25 NOTE — Telephone Encounter (Signed)
Pt approved for Entresto through 09/24/2020 from [facility] [id number]

## 2019-10-27 ENCOUNTER — Other Ambulatory Visit: Payer: Self-pay | Admitting: Cardiology

## 2019-10-31 ENCOUNTER — Other Ambulatory Visit: Payer: Self-pay | Admitting: Cardiology

## 2019-11-06 ENCOUNTER — Ambulatory Visit (INDEPENDENT_AMBULATORY_CARE_PROVIDER_SITE_OTHER): Payer: Medicare Other | Admitting: *Deleted

## 2019-11-06 DIAGNOSIS — I5022 Chronic systolic (congestive) heart failure: Secondary | ICD-10-CM

## 2019-11-06 LAB — CUP PACEART REMOTE DEVICE CHECK
Battery Remaining Longevity: 54 mo
Battery Remaining Percentage: 62 %
Battery Voltage: 2.95 V
Date Time Interrogation Session: 20210111022806
HighPow Impedance: 66 Ohm
HighPow Impedance: 66 Ohm
Implantable Lead Implant Date: 20160629
Implantable Lead Implant Date: 20160629
Implantable Lead Implant Date: 20180416
Implantable Lead Location: 753858
Implantable Lead Location: 753859
Implantable Lead Location: 753860
Implantable Lead Model: 7122
Implantable Pulse Generator Implant Date: 20180416
Lead Channel Impedance Value: 400 Ohm
Lead Channel Impedance Value: 410 Ohm
Lead Channel Impedance Value: 490 Ohm
Lead Channel Pacing Threshold Amplitude: 0.5 V
Lead Channel Pacing Threshold Amplitude: 0.5 V
Lead Channel Pacing Threshold Pulse Width: 0.5 ms
Lead Channel Pacing Threshold Pulse Width: 0.5 ms
Lead Channel Sensing Intrinsic Amplitude: 0.2 mV
Lead Channel Sensing Intrinsic Amplitude: 6.2 mV
Lead Channel Setting Pacing Amplitude: 2 V
Lead Channel Setting Pacing Amplitude: 2 V
Lead Channel Setting Pacing Pulse Width: 0.5 ms
Lead Channel Setting Pacing Pulse Width: 0.5 ms
Lead Channel Setting Sensing Sensitivity: 0.5 mV
Pulse Gen Serial Number: 7413271

## 2019-11-19 ENCOUNTER — Ambulatory Visit: Payer: Medicare Other | Admitting: Cardiology

## 2019-11-20 ENCOUNTER — Other Ambulatory Visit: Payer: Self-pay

## 2019-11-20 ENCOUNTER — Encounter: Payer: Self-pay | Admitting: Cardiology

## 2019-11-20 ENCOUNTER — Ambulatory Visit (INDEPENDENT_AMBULATORY_CARE_PROVIDER_SITE_OTHER): Payer: Medicare Other | Admitting: Cardiology

## 2019-11-20 VITALS — BP 98/67 | HR 74 | Ht 72.0 in | Wt 315.8 lb

## 2019-11-20 DIAGNOSIS — I472 Ventricular tachycardia, unspecified: Secondary | ICD-10-CM

## 2019-11-20 DIAGNOSIS — I4819 Other persistent atrial fibrillation: Secondary | ICD-10-CM

## 2019-11-20 DIAGNOSIS — I5022 Chronic systolic (congestive) heart failure: Secondary | ICD-10-CM | POA: Diagnosis not present

## 2019-11-20 NOTE — Patient Instructions (Signed)
Your physician recommends that you schedule a follow-up appointment in: 4 MONTHS WITH DR BRANCH  Your physician recommends that you continue on your current medications as directed. Please refer to the Current Medication list given to you today.  Thank you for choosing [facility]!!    
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
## 2019-11-27 ENCOUNTER — Other Ambulatory Visit: Payer: Self-pay | Admitting: Cardiology

## 2019-11-27 ENCOUNTER — Telehealth: Payer: Self-pay

## 2019-11-27 NOTE — Telephone Encounter (Signed)
Transmission reviewed. Normal device function. No alerts. Known permanent AF, programmed VVIR.  Advised pt of normal device function. Explained alert for his device is a vibratory alert, will not beep. Pt verbalizes understanding, will continue to look in his house for source of beeping. No additional questions or concerns at this time.

## 2019-11-27 NOTE — Telephone Encounter (Signed)
The pt states his ICD made a faint beeping sound. I asked the pt to send a manual transmission with his home monitor. The pt monitor is connected to his home line and he agreed to send the transmission. I told him when we receive the transmission the nurse will give him a call back.

## 2019-12-25 ENCOUNTER — Other Ambulatory Visit: Payer: Self-pay | Admitting: Cardiology

## 2020-01-02 ENCOUNTER — Telehealth: Payer: Self-pay

## 2020-01-02 NOTE — Telephone Encounter (Signed)
ICD alert received pt, received ATP therapy for VT episode 01/01/20 at 10:05am.  Presenting rhythm AF, pt with known history of permenant AF.  Current meds include Xarelto 20mg  daily, Entresto 97-103mg  BID, Metoprolol Succinate 100mg  BID.    Spoke with pt spouse, .  DPR on file.  Informed spouse of episode.  She indicates pt did not tell her of any symptoms at time of episode.  She will have him call to discuss.  Informed pt spouse of DMV laws regarding no driving for 6 months following treated episode.  She also confirmed that pt is compliant with medications.    Forwarded to MD for review.  Pt not currently scheduled for f/u due for in office visit 03/2020.

## 2020-01-02 NOTE — Telephone Encounter (Signed)
Pt returned phone call.  Stated he was not symptomatic at the time of episodes that he specifically recalls, although he did feel "swimmy headed" throughout the day yesterday.  Asymptomatic now.  Pt v/u regarding driving restrictions.

## 2020-01-03 NOTE — Telephone Encounter (Signed)
Agree. GT 

## 2020-01-07 ENCOUNTER — Telehealth: Payer: Self-pay

## 2020-01-07 NOTE — Telephone Encounter (Signed)
ICD alert received, pt had a treated VT episode.  1 burst of ATP initially successful.  This was second occurrence within a couple of days.  Pt was previously asymptomatic, MD was notified and no changes were made. Pt meds include- Metoprolol Succinate 100mg  BID, Entresto 97-103mg  BID.  Pt with history of permanent AF on OAC-Xarelto, presenting rhythm was AF.    Spoke with pt, he was symptomatic with this episode.  He was sitting on back porch not engaged in activity when he suddenly felt dizzy like he was going to pass out.  Pt denies syncopal episode or any other cardiac symptoms.  Pt confirms current meds and has not missed any doses.    Advised sending to MD for review.  Pt not due for in office visit until 03/2020

## 2020-01-14 NOTE — Telephone Encounter (Signed)
Continue meds. Followup as scheduled. GT

## 2020-02-05 ENCOUNTER — Ambulatory Visit (INDEPENDENT_AMBULATORY_CARE_PROVIDER_SITE_OTHER): Payer: Medicare Other | Admitting: *Deleted

## 2020-02-05 DIAGNOSIS — I5022 Chronic systolic (congestive) heart failure: Secondary | ICD-10-CM

## 2020-02-05 LAB — CUP PACEART REMOTE DEVICE CHECK
Battery Remaining Longevity: 53 mo
Battery Remaining Percentage: 60 %
Battery Voltage: 2.95 V
Date Time Interrogation Session: 20210413070916
HighPow Impedance: 66 Ohm
HighPow Impedance: 66 Ohm
Implantable Lead Implant Date: 20160629
Implantable Lead Implant Date: 20160629
Implantable Lead Implant Date: 20180416
Implantable Lead Location: 753858
Implantable Lead Location: 753859
Implantable Lead Location: 753860
Implantable Lead Model: 7122
Implantable Pulse Generator Implant Date: 20180416
Lead Channel Impedance Value: 410 Ohm
Lead Channel Impedance Value: 410 Ohm
Lead Channel Impedance Value: 530 Ohm
Lead Channel Pacing Threshold Amplitude: 0.5 V
Lead Channel Pacing Threshold Amplitude: 0.5 V
Lead Channel Pacing Threshold Pulse Width: 0.5 ms
Lead Channel Pacing Threshold Pulse Width: 0.5 ms
Lead Channel Sensing Intrinsic Amplitude: 0.2 mV
Lead Channel Sensing Intrinsic Amplitude: 7.5 mV
Lead Channel Setting Pacing Amplitude: 2 V
Lead Channel Setting Pacing Amplitude: 2 V
Lead Channel Setting Pacing Pulse Width: 0.5 ms
Lead Channel Setting Pacing Pulse Width: 0.5 ms
Lead Channel Setting Sensing Sensitivity: 0.5 mV
Pulse Gen Serial Number: 7413271

## 2020-02-06 NOTE — Progress Notes (Signed)
ICD Remote  

## 2020-02-08 ENCOUNTER — Telehealth: Payer: Self-pay | Admitting: Emergency Medicine

## 2020-02-25 ENCOUNTER — Other Ambulatory Visit: Payer: Self-pay | Admitting: Cardiology

## 2020-03-21 ENCOUNTER — Ambulatory Visit (INDEPENDENT_AMBULATORY_CARE_PROVIDER_SITE_OTHER): Payer: Medicare Other | Admitting: Cardiology

## 2020-03-21 ENCOUNTER — Other Ambulatory Visit: Payer: Self-pay

## 2020-03-21 ENCOUNTER — Encounter: Payer: Self-pay | Admitting: Cardiology

## 2020-03-21 VITALS — BP 90/60 | HR 76 | Ht 72.0 in | Wt 310.4 lb

## 2020-03-21 DIAGNOSIS — I4891 Unspecified atrial fibrillation: Secondary | ICD-10-CM

## 2020-03-21 DIAGNOSIS — I5022 Chronic systolic (congestive) heart failure: Secondary | ICD-10-CM | POA: Diagnosis not present

## 2020-03-21 DIAGNOSIS — I472 Ventricular tachycardia, unspecified: Secondary | ICD-10-CM

## 2020-03-21 NOTE — Patient Instructions (Signed)
Your physician recommends that you schedule a follow-up appointment in: 4 MONTHS WITH DR [facility]  Your physician recommends that you continue on your current medications as directed. Please refer to the Current Medication list given to you today.  Your physician recommends that you return for lab work - PLEASE FAST 6-8 HOURS PRIOR TO LAB WORK - CBC/MAG/BMP/LIPIDS/HGBA1C/TSH  Thank you for choosing [facility]!!
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
## 2020-03-25 DIAGNOSIS — I509 Heart failure, unspecified: Secondary | ICD-10-CM | POA: Diagnosis not present

## 2020-03-27 ENCOUNTER — Other Ambulatory Visit: Payer: Self-pay | Admitting: Cardiology

## 2020-03-31 ENCOUNTER — Telehealth: Payer: Self-pay | Admitting: *Deleted

## 2020-03-31 NOTE — Telephone Encounter (Signed)
-----   Message from Antoine Poche, MD sent at 03/28/2020 11:10 AM EDT ----- Normal labs  Dominga Ferry MD

## 2020-03-31 NOTE — Telephone Encounter (Signed)
Pt aware - routed to pcp  

## 2020-05-01 ENCOUNTER — Other Ambulatory Visit: Payer: Self-pay

## 2020-05-01 ENCOUNTER — Encounter: Payer: Self-pay | Admitting: Internal Medicine

## 2020-05-01 ENCOUNTER — Ambulatory Visit (INDEPENDENT_AMBULATORY_CARE_PROVIDER_SITE_OTHER): Payer: Medicare Other | Admitting: Internal Medicine

## 2020-05-01 VITALS — BP 104/66 | HR 74 | Ht 72.0 in | Wt 308.0 lb

## 2020-05-01 DIAGNOSIS — I472 Ventricular tachycardia, unspecified: Secondary | ICD-10-CM

## 2020-05-01 DIAGNOSIS — I4819 Other persistent atrial fibrillation: Secondary | ICD-10-CM | POA: Diagnosis not present

## 2020-05-01 DIAGNOSIS — Z95810 Presence of automatic (implantable) cardiac defibrillator: Secondary | ICD-10-CM | POA: Diagnosis not present

## 2020-05-01 DIAGNOSIS — I5022 Chronic systolic (congestive) heart failure: Secondary | ICD-10-CM

## 2020-05-01 LAB — CUP PACEART INCLINIC DEVICE CHECK
Battery Remaining Longevity: 50 mo
Brady Statistic RA Percent Paced: 0 %
Brady Statistic RV Percent Paced: 86 %
Date Time Interrogation Session: 20210708171816
HighPow Impedance: 73.125
Implantable Lead Implant Date: 20160629
Implantable Lead Implant Date: 20160629
Implantable Lead Implant Date: 20180416
Implantable Lead Location: 753858
Implantable Lead Location: 753859
Implantable Lead Location: 753860
Implantable Lead Model: 7122
Implantable Pulse Generator Implant Date: 20180416
Lead Channel Impedance Value: 412.5 Ohm
Lead Channel Impedance Value: 487.5 Ohm
Lead Channel Impedance Value: 575 Ohm
Lead Channel Pacing Threshold Amplitude: 0.375 V
Lead Channel Pacing Threshold Amplitude: 0.5 V
Lead Channel Pacing Threshold Pulse Width: 0.5 ms
Lead Channel Pacing Threshold Pulse Width: 0.5 ms
Lead Channel Sensing Intrinsic Amplitude: 0.2 mV
Lead Channel Setting Pacing Amplitude: 2 V
Lead Channel Setting Pacing Amplitude: 2 V
Lead Channel Setting Pacing Pulse Width: 0.5 ms
Lead Channel Setting Pacing Pulse Width: 0.5 ms
Lead Channel Setting Sensing Sensitivity: 0.5 mV
Pulse Gen Serial Number: 7413271

## 2020-05-01 NOTE — Patient Instructions (Signed)
Medication Instructions:  Your physician recommends that you continue on your current medications as directed. Please refer to the Current Medication list given to you today.  *If you need a refill on your cardiac medications before your next appointment, please call your pharmacy*  Lab Work: None ordered.  If you have labs (blood work) drawn today and your tests are completely normal, you will receive your results only by: Marland Kitchen MyChart Message (if you have MyChart) OR . A paper copy in the mail If you have any lab test that is abnormal or we need to change your treatment, we will call you to review the results.  Testing/Procedures: None ordered.  Follow-Up: At [facility], you and your health needs are our priority.  As part of our continuing mission to provide you with exceptional heart care, we have created designated Provider Care Teams.  These Care Teams include your primary Cardiologist (physician) and Advanced Practice Providers (APPs -  Physician Assistants and Nurse Practitioners) who all work together to provide you with the care you need, when you need it.  We recommend signing up for the patient portal called "MyChart".  Sign up information is provided on this After Visit Summary.  MyChart is used to connect with patients for Virtual Visits (Telemedicine).  Patients are able to view lab/test results, encounter notes, upcoming appointments, etc.  Non-urgent messages can be sent to your provider as well.   To learn more about what you can do with MyChart, go to [email or web address].    Your next appointment:   Your physician wants you to follow-up in: 1 year with Dr. Ladona Ridgel. You will receive a reminder letter in the mail two months in advance. If you don't receive a letter, please call our office to schedule the follow-up appointment.  Remote monitoring is used to monitor your ICD from home. This monitoring reduces the number of office visits required to check your device to one  time per year. It allows Brandon Torres to keep an eye on the functioning of your device to ensure it is working properly. You are scheduled for a device check from home on 05/06/2020. You may send your transmission at any time that day. If you have a wireless device, the transmission will be sent automatically. After your physician reviews your transmission, you will receive a postcard with your next transmission date.  Other Instructions:
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
## 2020-05-06 ENCOUNTER — Ambulatory Visit (INDEPENDENT_AMBULATORY_CARE_PROVIDER_SITE_OTHER): Payer: Medicare Other | Admitting: Internal Medicine

## 2020-05-06 ENCOUNTER — Encounter (INDEPENDENT_AMBULATORY_CARE_PROVIDER_SITE_OTHER): Payer: Self-pay | Admitting: Internal Medicine

## 2020-05-06 ENCOUNTER — Ambulatory Visit (INDEPENDENT_AMBULATORY_CARE_PROVIDER_SITE_OTHER): Payer: Medicare Other | Admitting: *Deleted

## 2020-05-06 ENCOUNTER — Other Ambulatory Visit: Payer: Self-pay

## 2020-05-06 VITALS — BP 90/70 | HR 94 | Temp 98.4°F | Ht 72.0 in | Wt 308.6 lb

## 2020-05-06 DIAGNOSIS — I428 Other cardiomyopathies: Secondary | ICD-10-CM

## 2020-05-06 DIAGNOSIS — M25512 Pain in left shoulder: Secondary | ICD-10-CM

## 2020-05-06 DIAGNOSIS — I429 Cardiomyopathy, unspecified: Secondary | ICD-10-CM

## 2020-05-06 DIAGNOSIS — G8929 Other chronic pain: Secondary | ICD-10-CM

## 2020-05-06 DIAGNOSIS — I5022 Chronic systolic (congestive) heart failure: Secondary | ICD-10-CM

## 2020-05-06 LAB — CUP PACEART REMOTE DEVICE CHECK
Battery Remaining Longevity: 52 mo
Battery Remaining Percentage: 56 %
Battery Voltage: 2.93 V
Date Time Interrogation Session: 20210713072713
HighPow Impedance: 70 Ohm
HighPow Impedance: 70 Ohm
Implantable Lead Implant Date: 20160629
Implantable Lead Implant Date: 20160629
Implantable Lead Implant Date: 20180416
Implantable Lead Location: 753858
Implantable Lead Location: 753859
Implantable Lead Location: 753860
Implantable Lead Model: 7122
Implantable Pulse Generator Implant Date: 20180416
Lead Channel Impedance Value: 410 Ohm
Lead Channel Impedance Value: 490 Ohm
Lead Channel Impedance Value: 590 Ohm
Lead Channel Pacing Threshold Amplitude: 0.375 V
Lead Channel Pacing Threshold Amplitude: 0.5 V
Lead Channel Pacing Threshold Pulse Width: 0.5 ms
Lead Channel Pacing Threshold Pulse Width: 0.5 ms
Lead Channel Sensing Intrinsic Amplitude: 0.2 mV
Lead Channel Sensing Intrinsic Amplitude: 7.6 mV
Lead Channel Setting Pacing Amplitude: 2 V
Lead Channel Setting Pacing Amplitude: 2 V
Lead Channel Setting Pacing Pulse Width: 0.5 ms
Lead Channel Setting Pacing Pulse Width: 0.5 ms
Lead Channel Setting Sensing Sensitivity: 0.5 mV
Pulse Gen Serial Number: 7413271
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
## 2020-05-07 NOTE — Progress Notes (Signed)
Remote ICD transmission.   

## 2020-06-09 ENCOUNTER — Ambulatory Visit: Payer: Medicare Other | Admitting: Orthopedic Surgery

## 2020-06-09 DIAGNOSIS — M25512 Pain in left shoulder: Secondary | ICD-10-CM | POA: Diagnosis not present

## 2020-06-16 ENCOUNTER — Other Ambulatory Visit: Payer: Self-pay | Admitting: Orthopedic Surgery

## 2020-06-16 DIAGNOSIS — M25512 Pain in left shoulder: Secondary | ICD-10-CM

## 2020-07-03 ENCOUNTER — Ambulatory Visit
Admission: RE | Admit: 2020-07-03 | Discharge: 2020-07-03 | Disposition: A | Discharge status: Home / Self Care | Payer: Medicare Other | Source: Ambulatory Visit | Attending: Orthopedic Surgery | Admitting: Orthopedic Surgery

## 2020-07-03 ENCOUNTER — Other Ambulatory Visit: Payer: Self-pay

## 2020-07-03 DIAGNOSIS — M25512 Pain in left shoulder: Secondary | ICD-10-CM

## 2020-07-07 ENCOUNTER — Ambulatory Visit (INDEPENDENT_AMBULATORY_CARE_PROVIDER_SITE_OTHER): Payer: Medicare Other | Admitting: Internal Medicine

## 2020-07-11 ENCOUNTER — Other Ambulatory Visit: Payer: Self-pay

## 2020-07-11 ENCOUNTER — Ambulatory Visit
Admission: RE | Admit: 2020-07-11 | Discharge: 2020-07-11 | Disposition: A | Discharge status: Home / Self Care | Payer: Medicare Other | Source: Ambulatory Visit | Attending: Orthopedic Surgery | Admitting: Orthopedic Surgery

## 2020-07-11 DIAGNOSIS — G8929 Other chronic pain: Secondary | ICD-10-CM | POA: Diagnosis not present

## 2020-07-11 DIAGNOSIS — M25512 Pain in left shoulder: Secondary | ICD-10-CM | POA: Diagnosis not present

## 2020-07-11 DIAGNOSIS — M75112 Incomplete rotator cuff tear or rupture of left shoulder, not specified as traumatic: Secondary | ICD-10-CM | POA: Diagnosis not present

## 2020-07-11 DIAGNOSIS — M25112 Fistula, left shoulder: Secondary | ICD-10-CM | POA: Diagnosis present

## 2020-07-11 MED ORDER — SODIUM CHLORIDE (PF) 0.9 % IJ SOLN
10.0000 mL | Freq: Once | INTRAMUSCULAR | Status: AC
  Administered 2020-07-11: 5 mL via INTRAVENOUS

## 2020-07-11 MED ORDER — LIDOCAINE HCL (PF) 1 % IJ SOLN
5.0000 mL | Freq: Once | INTRAMUSCULAR | Status: AC
  Administered 2020-07-11: 5 mL
  Filled 2020-07-11: qty 5

## 2020-07-11 MED ORDER — IOHEXOL 180 MG/ML  SOLN
20.0000 mL | Freq: Once | INTRAMUSCULAR | Status: AC
  Administered 2020-07-11: 15 mL

## 2020-07-14 DIAGNOSIS — M75102 Unspecified rotator cuff tear or rupture of left shoulder, not specified as traumatic: Secondary | ICD-10-CM | POA: Diagnosis not present

## 2020-07-23 ENCOUNTER — Ambulatory Visit: Payer: Medicare Other | Admitting: Family Medicine

## 2020-07-24 ENCOUNTER — Encounter: Payer: Self-pay | Admitting: Cardiology

## 2020-07-24 ENCOUNTER — Ambulatory Visit (INDEPENDENT_AMBULATORY_CARE_PROVIDER_SITE_OTHER): Payer: Medicare Other | Admitting: Cardiology

## 2020-07-24 VITALS — BP 92/72 | HR 80 | Ht 72.0 in | Wt 303.0 lb

## 2020-07-24 DIAGNOSIS — I472 Ventricular tachycardia, unspecified: Secondary | ICD-10-CM

## 2020-07-24 DIAGNOSIS — I4891 Unspecified atrial fibrillation: Secondary | ICD-10-CM | POA: Diagnosis not present

## 2020-07-24 DIAGNOSIS — Z23 Encounter for immunization: Secondary | ICD-10-CM

## 2020-07-24 DIAGNOSIS — I5022 Chronic systolic (congestive) heart failure: Secondary | ICD-10-CM

## 2020-07-24 NOTE — Patient Instructions (Signed)
Your physician recommends that you schedule a follow-up appointment in: 4 MONTHS WITH DR [facility]  Your physician recommends that you continue on your current medications as directed. Please refer to the Current Medication list given to you today.  You have been referred to HEART FAILURE CLINIC  Thank you for choosing [facility]!!
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
## 2020-07-26 ENCOUNTER — Other Ambulatory Visit: Payer: Self-pay | Admitting: Cardiology

## 2020-08-04 ENCOUNTER — Ambulatory Visit (INDEPENDENT_AMBULATORY_CARE_PROVIDER_SITE_OTHER): Payer: Medicare Other | Admitting: Internal Medicine

## 2020-08-04 DIAGNOSIS — M75102 Unspecified rotator cuff tear or rupture of left shoulder, not specified as traumatic: Secondary | ICD-10-CM | POA: Diagnosis not present

## 2020-08-04 DIAGNOSIS — M25512 Pain in left shoulder: Secondary | ICD-10-CM | POA: Diagnosis not present

## 2020-08-05 ENCOUNTER — Ambulatory Visit (INDEPENDENT_AMBULATORY_CARE_PROVIDER_SITE_OTHER): Payer: Medicare Other

## 2020-08-05 DIAGNOSIS — I5022 Chronic systolic (congestive) heart failure: Secondary | ICD-10-CM

## 2020-08-06 LAB — CUP PACEART REMOTE DEVICE CHECK
Battery Remaining Longevity: 49 mo
Battery Remaining Percentage: 53 %
Battery Voltage: 2.93 V
Date Time Interrogation Session: 20211012190507
HighPow Impedance: 78 Ohm
HighPow Impedance: 78 Ohm
Implantable Lead Implant Date: 20160629
Implantable Lead Implant Date: 20160629
Implantable Lead Implant Date: 20180416
Implantable Lead Location: 753858
Implantable Lead Location: 753859
Implantable Lead Location: 753860
Implantable Lead Model: 7122
Implantable Pulse Generator Implant Date: 20180416
Lead Channel Impedance Value: 400 Ohm
Lead Channel Impedance Value: 480 Ohm
Lead Channel Impedance Value: 580 Ohm
Lead Channel Pacing Threshold Amplitude: 0.375 V
Lead Channel Pacing Threshold Amplitude: 0.5 V
Lead Channel Pacing Threshold Pulse Width: 0.5 ms
Lead Channel Pacing Threshold Pulse Width: 0.5 ms
Lead Channel Sensing Intrinsic Amplitude: 0.2 mV
Lead Channel Sensing Intrinsic Amplitude: 7.5 mV
Lead Channel Setting Pacing Amplitude: 2 V
Lead Channel Setting Pacing Amplitude: 2 V
Lead Channel Setting Pacing Pulse Width: 0.5 ms
Lead Channel Setting Pacing Pulse Width: 0.5 ms
Lead Channel Setting Sensing Sensitivity: 0.5 mV
Pulse Gen Serial Number: 7413271

## 2020-08-08 NOTE — Progress Notes (Signed)
Remote ICD transmission.   

## 2020-08-12 DIAGNOSIS — M25512 Pain in left shoulder: Secondary | ICD-10-CM | POA: Diagnosis not present

## 2020-08-12 DIAGNOSIS — M75102 Unspecified rotator cuff tear or rupture of left shoulder, not specified as traumatic: Secondary | ICD-10-CM | POA: Diagnosis not present

## 2020-08-19 DIAGNOSIS — M75102 Unspecified rotator cuff tear or rupture of left shoulder, not specified as traumatic: Secondary | ICD-10-CM | POA: Diagnosis not present

## 2020-08-19 DIAGNOSIS — M25512 Pain in left shoulder: Secondary | ICD-10-CM | POA: Diagnosis not present

## 2020-08-24 ENCOUNTER — Other Ambulatory Visit: Payer: Self-pay

## 2020-08-24 ENCOUNTER — Ambulatory Visit
Admission: RE | Admit: 2020-08-24 | Discharge: 2020-08-24 | Disposition: A | Discharge status: Home / Self Care | Payer: 59 | Source: Ambulatory Visit | Attending: Emergency Medicine | Admitting: Emergency Medicine

## 2020-08-24 VITALS — BP 105/73 | HR 91 | Temp 98.3°F | Resp 20

## 2020-08-24 DIAGNOSIS — H02843 Edema of right eye, unspecified eyelid: Secondary | ICD-10-CM

## 2020-08-24 DIAGNOSIS — H66001 Acute suppurative otitis media without spontaneous rupture of ear drum, right ear: Secondary | ICD-10-CM

## 2020-08-24 MED ORDER — PREDNISONE 20 MG PO TABS: 20.0000 mg | ORAL_TABLET | Freq: Two times a day (BID) | ORAL | 0 refills | Status: AC

## 2020-08-24 MED ORDER — AMOXICILLIN-POT CLAVULANATE 875-125 MG PO TABS: 1.0000 | ORAL_TABLET | Freq: Two times a day (BID) | ORAL | 0 refills | Status: AC

## 2020-08-24 NOTE — Discharge Instructions (Signed)
Augmentin prescribed.  Take as directed and to completion Prednisone prescribed for swelling Rest and push fluids Use OTC ibuprofen and tylenol as needed for pain Follow up with PCP as needed Return or go to the ED if you have any new or worsening symptoms such as fever, chills, nausea, vomiting, increased redness, swelling, discharge, vision changes, painful eye movement, etc..Marland Kitchen
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
## 2020-08-24 NOTE — ED Triage Notes (Signed)
Pt presents with right lower eye lid swelling since yesterday

## 2020-08-25 ENCOUNTER — Other Ambulatory Visit: Payer: Self-pay | Admitting: Cardiology

## 2020-09-02 DIAGNOSIS — M25512 Pain in left shoulder: Secondary | ICD-10-CM | POA: Diagnosis not present

## 2020-09-02 DIAGNOSIS — M75102 Unspecified rotator cuff tear or rupture of left shoulder, not specified as traumatic: Secondary | ICD-10-CM | POA: Diagnosis not present

## 2020-09-16 DIAGNOSIS — M75102 Unspecified rotator cuff tear or rupture of left shoulder, not specified as traumatic: Secondary | ICD-10-CM | POA: Diagnosis not present

## 2020-09-16 DIAGNOSIS — M25512 Pain in left shoulder: Secondary | ICD-10-CM | POA: Diagnosis not present

## 2020-09-23 ENCOUNTER — Encounter (HOSPITAL_COMMUNITY): Payer: Self-pay | Admitting: Cardiology

## 2020-09-23 ENCOUNTER — Ambulatory Visit (HOSPITAL_COMMUNITY)
Admission: RE | Admit: 2020-09-23 | Discharge: 2020-09-23 | Disposition: A | Discharge status: Home / Self Care | Payer: Medicare Other | Source: Ambulatory Visit | Attending: Cardiology | Admitting: Cardiology

## 2020-09-23 ENCOUNTER — Other Ambulatory Visit: Payer: Self-pay

## 2020-09-23 VITALS — BP 120/68 | HR 69 | Wt 316.6 lb

## 2020-09-23 DIAGNOSIS — I482 Chronic atrial fibrillation, unspecified: Secondary | ICD-10-CM

## 2020-09-23 DIAGNOSIS — I5040 Unspecified combined systolic (congestive) and diastolic (congestive) heart failure: Secondary | ICD-10-CM

## 2020-09-23 DIAGNOSIS — Z79899 Other long term (current) drug therapy: Secondary | ICD-10-CM | POA: Insufficient documentation

## 2020-09-23 DIAGNOSIS — M25512 Pain in left shoulder: Secondary | ICD-10-CM | POA: Diagnosis not present

## 2020-09-23 DIAGNOSIS — M75102 Unspecified rotator cuff tear or rupture of left shoulder, not specified as traumatic: Secondary | ICD-10-CM | POA: Diagnosis not present

## 2020-09-23 DIAGNOSIS — I5022 Chronic systolic (congestive) heart failure: Secondary | ICD-10-CM | POA: Diagnosis not present

## 2020-09-23 DIAGNOSIS — I428 Other cardiomyopathies: Secondary | ICD-10-CM | POA: Diagnosis not present

## 2020-09-23 DIAGNOSIS — Z95810 Presence of automatic (implantable) cardiac defibrillator: Secondary | ICD-10-CM | POA: Diagnosis not present

## 2020-09-23 DIAGNOSIS — I5042 Chronic combined systolic (congestive) and diastolic (congestive) heart failure: Secondary | ICD-10-CM | POA: Diagnosis not present

## 2020-09-23 DIAGNOSIS — Z7901 Long term (current) use of anticoagulants: Secondary | ICD-10-CM | POA: Diagnosis not present

## 2020-09-23 DIAGNOSIS — Z8249 Family history of ischemic heart disease and other diseases of the circulatory system: Secondary | ICD-10-CM | POA: Insufficient documentation

## 2020-09-23 LAB — CBC
HCT: 46 % (ref 39.0–52.0)
Hemoglobin: 14.9 g/dL (ref 13.0–17.0)
MCH: 30.4 pg (ref 26.0–34.0)
MCHC: 32.4 g/dL (ref 30.0–36.0)
MCV: 93.9 fL (ref 80.0–100.0)
Platelets: 194 10*3/uL (ref 150–400)
RBC: 4.9 MIL/uL (ref 4.22–5.81)
RDW: 12 % (ref 11.5–15.5)
WBC: 6.7 10*3/uL (ref 4.0–10.5)
nRBC: 0 % (ref 0.0–0.2)

## 2020-09-23 LAB — BASIC METABOLIC PANEL
Anion gap: 10 (ref 5–15)
BUN: 13 mg/dL (ref 6–20)
CO2: 28 mmol/L (ref 22–32)
Calcium: 9.6 mg/dL (ref 8.9–10.3)
Chloride: 97 mmol/L — ABNORMAL LOW (ref 98–111)
Creatinine, Ser: 0.9 mg/dL (ref 0.61–1.24)
GFR, Estimated: >60 mL/min (ref >60)
Glucose, Bld: 96 mg/dL (ref 70–99)
Potassium: 3.9 mmol/L (ref 3.5–5.1)
Sodium: 135 mmol/L (ref 135–145)

## 2020-09-23 MED ORDER — EMPAGLIFLOZIN 10 MG PO TABS: 10.0000 mg | ORAL_TABLET | Freq: Every day | ORAL | 6 refills | Status: DC

## 2020-09-23 MED ORDER — SPIRONOLACTONE 25 MG PO TABS
25.0000 mg | ORAL_TABLET | Freq: Every day | ORAL | 3 refills | Status: DC
Start: 2020-09-23 — End: 2021-09-24

## 2020-09-23 NOTE — Patient Instructions (Addendum)
Increase Spironolactone to 25 mg Daily  Start Jardiance 10 mg Daily  Labs done today, your results will be available in MyChart, we will contact you for abnormal readings.  Your physician recommends that you return for lab work in: 10-14 days  Your physician has requested that you have an echocardiogram. Echocardiography is a painless test that uses sound waves to create images of your heart. It provides your doctor with information about the size and shape of your heart and how well your heart's chambers and valves are working. This procedure takes approximately one hour. There are no restrictions for this procedure.  Your physician has recommended that you have a cardiopulmonary stress test (CPX). CPX testing is a non-invasive measurement of heart and lung function. It replaces a traditional treadmill stress test. This type of test provides a tremendous amount of information that relates not only to your present condition but also for future outcomes. This test combines measurements of you ventilation, respiratory gas exchange in the lungs, electrocardiogram (EKG), blood pressure and physical response before, during, and following an exercise protocol.  Your physician recommends that you schedule a follow-up appointment in: 3 months

## 2020-09-24 ENCOUNTER — Other Ambulatory Visit: Payer: Self-pay | Admitting: Cardiology

## 2020-09-24 NOTE — Progress Notes (Signed)
PCP: [name], MD Cardiology: Dr. Wyline Mood HF Cardiology: Dr. Shirlee Latch  54 y.o. with history of nonischemic cardiomyopathy and chronic atrial fibrillation was referred by Dr. Wyline Mood for CHF evaluation.  Patient was diagnosed with CHF about 10 years ago.  He has had persistently low EF.  Most recent echo in 2/19 showed EF 20-25%.  Cath in 1/16 showed no significant CAD.  He has a Secondary school teacher CRT-D device.  He has been in chronic atrial fibrillation for years now.  He had ablation x 2 with breakthrough AF, also failed Tikosyn and amiodarone. 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  No ETOH or smoking.    He is stable symptomatically.  No problems walking on flat ground or climbing 2 flights of stairs.  No problems with yardwork.  He will at times get lightheaded with bending over.  No chest pain.  He does not feel palpitations.    St Jude device interrogation: 95% BiV pacing, stable thoracic impedance.   Labs (6/21): TSH normal, hgb 13.6, creatinine 0.97, LDL 97  ECG (personally reviewed): atrial fibrillation, BiV pacing  PMH: 1. Atrial fibrillation: Chronic. He has had ablation x 2 with breakthrough atrial fibrillation. He failed Tikosyn and amiodarone.  2. H/o VT 3. OSA: Tries to use CPAP.  4. Obesity 5. Chronic systolic CHF: Nonischemic cardiomyopathy. St Jude CRT-D device.  - Echo (2013): EF 40%.  - Echo (1/16): EF 15-20%.  - LHC (1/16): Normal coronaries.  - Echo (2/19): EF 20-25%  FH: Grandfather died of MI at 47.  SH: Married, retired Engineer, site, no ETOH or smoking, lives in [place].   ROS: All systems reviewed and negative except as per HPI.   Current Outpatient Medications  Medication Sig Dispense Refill  . acetaminophen (TYLENOL) 500 MG tablet Take 1,000 mg by mouth daily as needed for mild pain or headache.     Marland Kitchen ENTRESTO 97-103 MG TAKE ONE TABLET BY MOUTH TWICE DAILY. 60 tablet 3  . furosemide (LASIX) 40 MG tablet TAKE ONE TABLET  BY MOUTH DAILY AS NEEDED SWELLING. 30 tablet 6  . metoprolol succinate (TOPROL-XL) 100 MG 24 hr tablet TAKE (1) TABLET BY MOUTH TWICE DAILY. TAKE WITH OR IMMEDIATELY FOLLOWING A MEAL. 60 tablet 11  . spironolactone (ALDACTONE) 25 MG tablet Take 1 tablet (25 mg total) by mouth daily. 90 tablet 3  . empagliflozin (JARDIANCE) 10 MG TABS tablet Take 1 tablet (10 mg total) by mouth daily before breakfast. 30 tablet 6  . XARELTO 20 MG TABS tablet TAKE ONE TABLET BY MOUTH EVERY DAY WITH DINNER. 30 tablet 6   No current facility-administered medications for this encounter.   BP 120/68   Pulse 69   Wt (!) 143.6 kg (316 lb 9.6 oz)   SpO2 99%   BMI 42.94 kg/m  General: NAD, obese Neck: JVP 8 cm, no thyromegaly or thyroid nodule.  Lungs: Clear to auscultation bilaterally with normal respiratory effort. CV: Nondisplaced PMI.  Heart irregular S1/S2, no S3/S4, no murmur.  Trace ankle edema.  No carotid bruit.  Normal pedal pulses.  Abdomen: Soft, nontender, no hepatosplenomegaly, no distention.  Skin: Intact without lesions or rashes.  Neurologic: Alert and oriented x 3.  Psych: Normal affect. Extremities: No clubbing or cyanosis.  HEENT: Normal.   Assessment/Plan: 1. Chronic systolic CHF: Nonischemic cardiomyopathy x years.  St Jude BiV ICD.  Cath in 1/16 with no significant CAD.  Most recent echo in 2/19 with EF 20-25%.  Cause of cardiomyopathy uncertain, ?viral myocarditis.  He does not have a strong family history of cardiomyopathy.  His pacemaker is not compatible with cardiac MRI.  He is in chronic atrial fibrillation but has a reasonable BiV pacing percentage at 95%.  NYHA class II, not volume overloaded by exam or [place].  - Continue Toprol XL 100 mg bid.  - Continue Entresto 97/103 bid. - Increase spironolactone to 25 mg daily.  - Add Farxiga 10 mg daily.  BMET today and in 10 days.  - I will arrange for CPX for objective assessment of functional capacity.  - He is due for echo, I will  arrange.  2. Atrial fibrillation: Chronic, he has failed ablation x 2, Tikosyn, and amiodarone.   Plan for rate-control.  - Continue Toprol XL.  - Continue Xarelto 20 mg daily.   Marca Ancona 09/25/2020

## 2020-09-30 DIAGNOSIS — M25512 Pain in left shoulder: Secondary | ICD-10-CM | POA: Diagnosis not present

## 2020-09-30 DIAGNOSIS — M75102 Unspecified rotator cuff tear or rupture of left shoulder, not specified as traumatic: Secondary | ICD-10-CM | POA: Diagnosis not present

## 2020-10-06 ENCOUNTER — Other Ambulatory Visit: Payer: Self-pay | Admitting: Cardiology

## 2020-10-06 DIAGNOSIS — I5022 Chronic systolic (congestive) heart failure: Secondary | ICD-10-CM | POA: Diagnosis not present

## 2020-10-07 DIAGNOSIS — M25512 Pain in left shoulder: Secondary | ICD-10-CM | POA: Diagnosis not present

## 2020-10-07 DIAGNOSIS — M75102 Unspecified rotator cuff tear or rupture of left shoulder, not specified as traumatic: Secondary | ICD-10-CM | POA: Diagnosis not present

## 2020-10-07 LAB — BASIC METABOLIC PANEL (7)
BUN/Creatinine Ratio: 17 (ref 9–20)
BUN: 17 mg/dL (ref 6–24)
CO2: 24 mmol/L (ref 20–29)
Chloride: 100 mmol/L (ref 96–106)
Creatinine, Ser: 1.02 mg/dL (ref 0.76–1.27)
GFR calc Af Amer: 97 mL/min/{1.73_m2} (ref >59)
GFR calc non Af Amer: 84 mL/min/{1.73_m2} (ref >59)
Glucose: 96 mg/dL (ref 65–99)
Potassium: 5 mmol/L (ref 3.5–5.2)
Sodium: 136 mmol/L (ref 134–144)

## 2020-10-09 ENCOUNTER — Other Ambulatory Visit (HOSPITAL_COMMUNITY): Payer: Self-pay | Admitting: Cardiology

## 2020-10-14 DIAGNOSIS — M75102 Unspecified rotator cuff tear or rupture of left shoulder, not specified as traumatic: Secondary | ICD-10-CM | POA: Diagnosis not present

## 2020-10-14 DIAGNOSIS — M25512 Pain in left shoulder: Secondary | ICD-10-CM | POA: Diagnosis not present

## 2020-10-21 DIAGNOSIS — M75102 Unspecified rotator cuff tear or rupture of left shoulder, not specified as traumatic: Secondary | ICD-10-CM | POA: Diagnosis not present

## 2020-10-21 DIAGNOSIS — M25512 Pain in left shoulder: Secondary | ICD-10-CM | POA: Diagnosis not present

## 2020-10-23 ENCOUNTER — Ambulatory Visit (HOSPITAL_COMMUNITY)
Admission: RE | Admit: 2020-10-23 | Discharge: 2020-10-23 | Disposition: A | Discharge status: Home / Self Care | Payer: Medicare Other | Source: Ambulatory Visit | Attending: Cardiology | Admitting: Cardiology

## 2020-10-23 ENCOUNTER — Other Ambulatory Visit: Payer: Self-pay

## 2020-10-23 ENCOUNTER — Ambulatory Visit (HOSPITAL_COMMUNITY): Payer: Medicare Other

## 2020-10-23 DIAGNOSIS — Z95810 Presence of automatic (implantable) cardiac defibrillator: Secondary | ICD-10-CM | POA: Insufficient documentation

## 2020-10-23 DIAGNOSIS — I5040 Unspecified combined systolic (congestive) and diastolic (congestive) heart failure: Secondary | ICD-10-CM

## 2020-10-23 DIAGNOSIS — I5043 Acute on chronic combined systolic (congestive) and diastolic (congestive) heart failure: Secondary | ICD-10-CM | POA: Diagnosis not present

## 2020-10-23 DIAGNOSIS — I5042 Chronic combined systolic (congestive) and diastolic (congestive) heart failure: Secondary | ICD-10-CM | POA: Diagnosis not present

## 2020-10-23 DIAGNOSIS — I493 Ventricular premature depolarization: Secondary | ICD-10-CM | POA: Diagnosis not present

## 2020-10-23 DIAGNOSIS — I4891 Unspecified atrial fibrillation: Secondary | ICD-10-CM | POA: Diagnosis not present

## 2020-10-23 LAB — ECHOCARDIOGRAM COMPLETE
Area-P 1/2: 3.6 cm2
S' Lateral: 5.1 cm

## 2020-10-23 NOTE — Progress Notes (Signed)
  Echocardiogram 2D Echocardiogram has been performed.  Brandon Torres 10/23/2020, 10:07 AM

## 2020-10-29 ENCOUNTER — Telehealth (HOSPITAL_COMMUNITY): Payer: Self-pay

## 2020-10-29 NOTE — Telephone Encounter (Signed)
-----   Message from Laurey Morale, MD sent at 10/27/2020 12:53 PM EST ----- Deconditioning and body habitus appear to be the main cause of his functional limitation, not heart failure.

## 2020-10-29 NOTE — Telephone Encounter (Signed)
Samara Snide, RN  10/29/2020 1:16 PM EST Back to Top     Patient's wife advised and verbalized understanding

## 2020-10-29 NOTE — Telephone Encounter (Signed)
Samara Snide, RN  10/29/2020 1:17 PM EST Back to Top     Patient's wife advised and verbalized understanding

## 2020-10-29 NOTE — Telephone Encounter (Signed)
-----   Message from Laurey Morale, MD sent at 10/23/2020 10:47 PM EST ----- EF remains low, 20-25%.

## 2020-11-04 ENCOUNTER — Ambulatory Visit (INDEPENDENT_AMBULATORY_CARE_PROVIDER_SITE_OTHER): Payer: Medicare Other

## 2020-11-04 DIAGNOSIS — I429 Cardiomyopathy, unspecified: Secondary | ICD-10-CM

## 2020-11-04 DIAGNOSIS — I428 Other cardiomyopathies: Secondary | ICD-10-CM | POA: Diagnosis not present

## 2020-11-04 DIAGNOSIS — I5022 Chronic systolic (congestive) heart failure: Secondary | ICD-10-CM

## 2020-11-05 LAB — CUP PACEART REMOTE DEVICE CHECK
Battery Remaining Longevity: 46 mo
Battery Remaining Percentage: 50 %
Battery Voltage: 2.93 V
Date Time Interrogation Session: 20220111071049
HighPow Impedance: 73 Ohm
HighPow Impedance: 73 Ohm
Implantable Lead Implant Date: 20160629
Implantable Lead Implant Date: 20160629
Implantable Lead Implant Date: 20180416
Implantable Lead Location: 753858
Implantable Lead Location: 753859
Implantable Lead Location: 753860
Implantable Lead Model: 7122
Implantable Pulse Generator Implant Date: 20180416
Lead Channel Impedance Value: 400 Ohm
Lead Channel Impedance Value: 530 Ohm
Lead Channel Impedance Value: 580 Ohm
Lead Channel Pacing Threshold Amplitude: 0.5 V
Lead Channel Pacing Threshold Amplitude: 0.5 V
Lead Channel Pacing Threshold Pulse Width: 0.5 ms
Lead Channel Pacing Threshold Pulse Width: 0.5 ms
Lead Channel Sensing Intrinsic Amplitude: 0.2 mV
Lead Channel Sensing Intrinsic Amplitude: 9.4 mV
Lead Channel Setting Pacing Amplitude: 2 V
Lead Channel Setting Pacing Amplitude: 2 V
Lead Channel Setting Pacing Pulse Width: 0.5 ms
Lead Channel Setting Pacing Pulse Width: 0.5 ms
Lead Channel Setting Sensing Sensitivity: 0.5 mV
Pulse Gen Serial Number: 7413271

## 2020-11-12 IMAGING — CT CT SHOULDER*L* W/CM
1 series · 12 of 14 positions shown, 15 images · non-contrast
Comparison: None.

CLINICAL DATA: Chronic left shoulder pain.

EXAM:
CT ARTHROGRAPHY OF THE LEFT SHOULDER
TECHNIQUE: Multidetector CT imaging was performed following the standard
protocol after injection of dilute contrast into the joint.

[Series 5: ax st · axial · 0.44mm/px · z∈[-28,+100]mm · 12 of 76 slices shown, 15 images]
[im 6/76  soft-tissue]
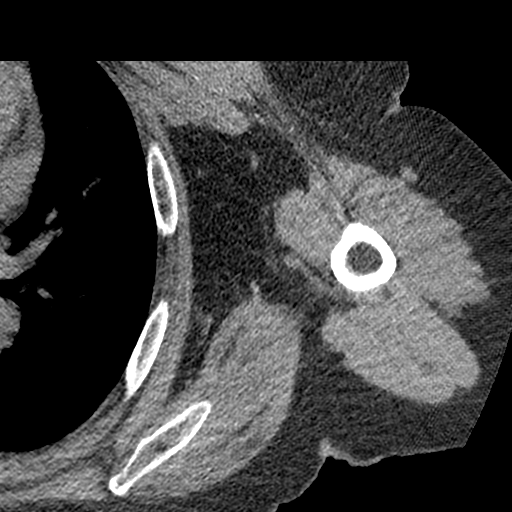
[im 6/76  bone]
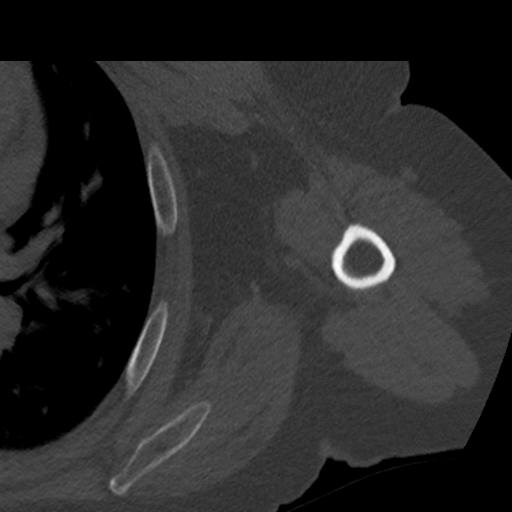
[im 12/76  bone]
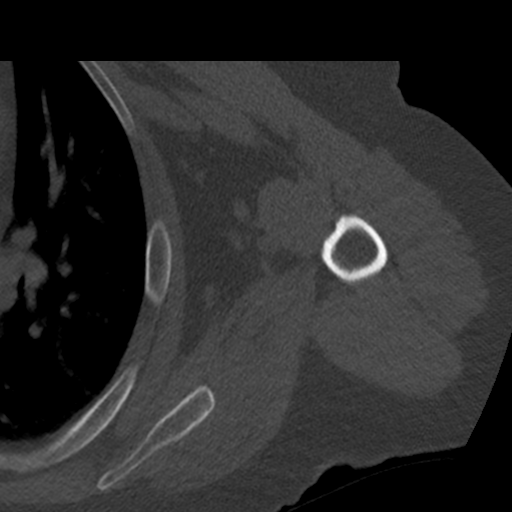
[im 18/76  bone]
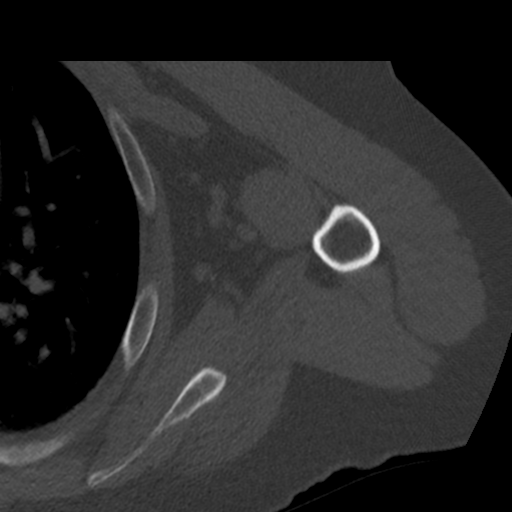
[im 24/76  bone]
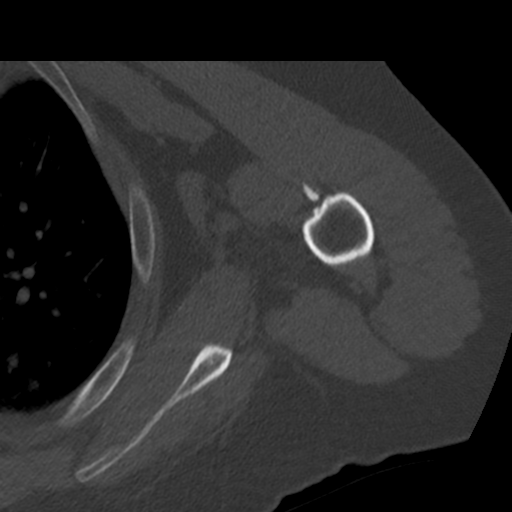
[im 29/76  soft-tissue]
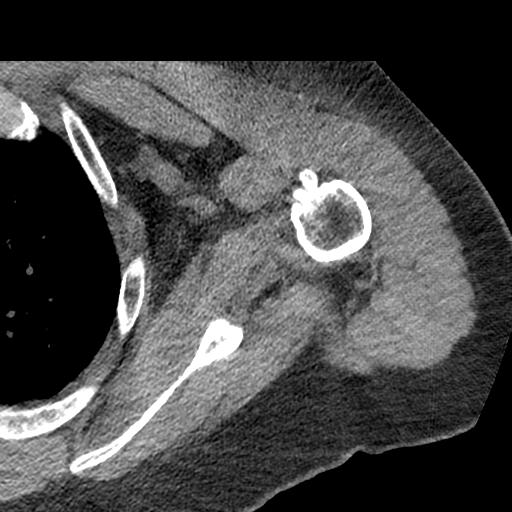
[im 29/76  bone]
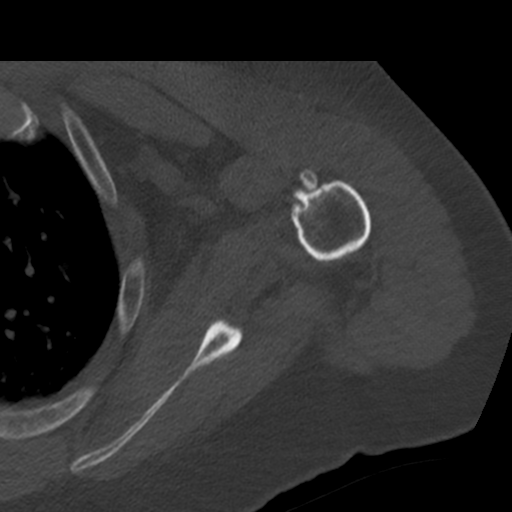
[im 35/76  bone]
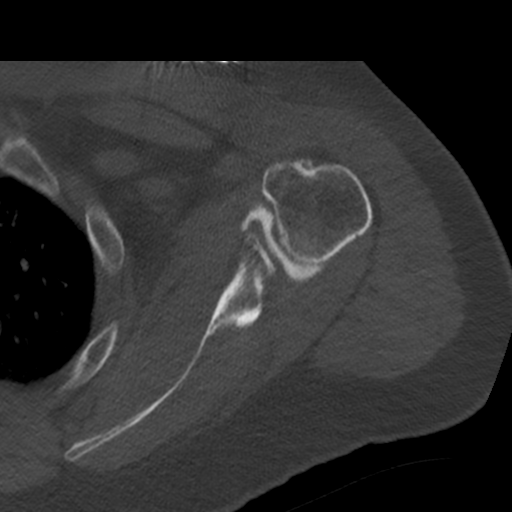
[im 41/76  bone]
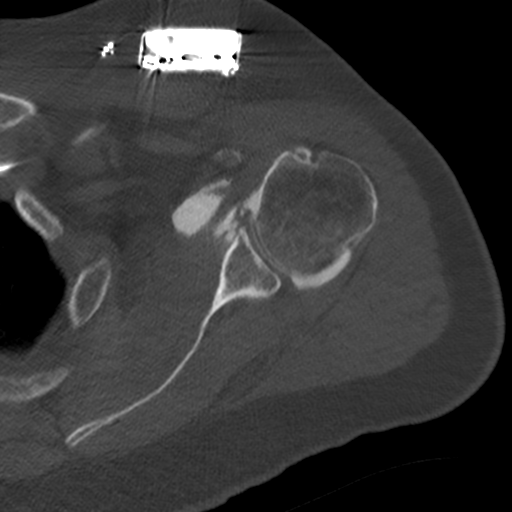
[im 47/76  bone]
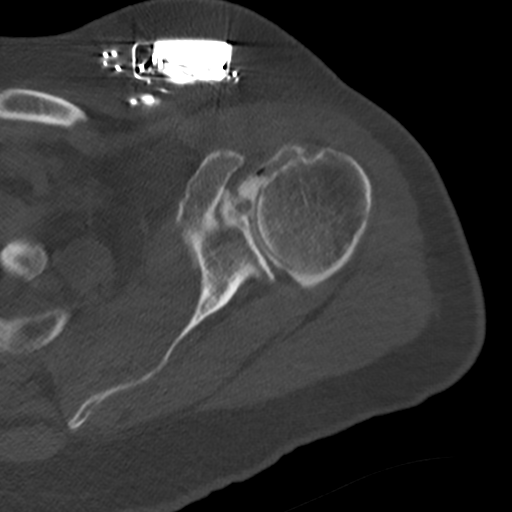
[im 52/76  soft-tissue]
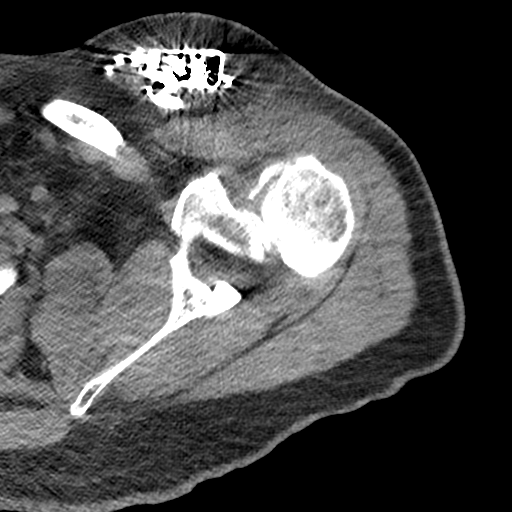
[im 52/76  bone]
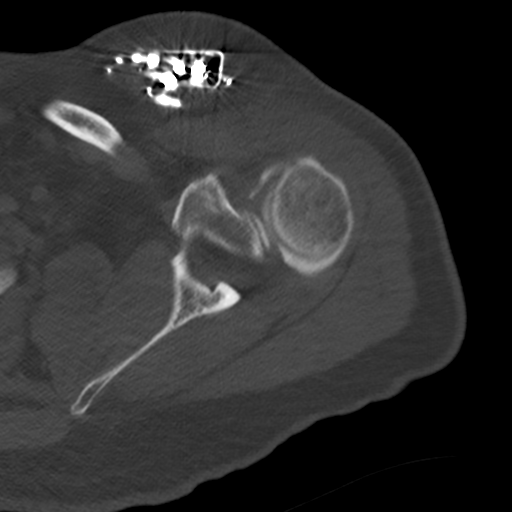
[im 58/76  bone]
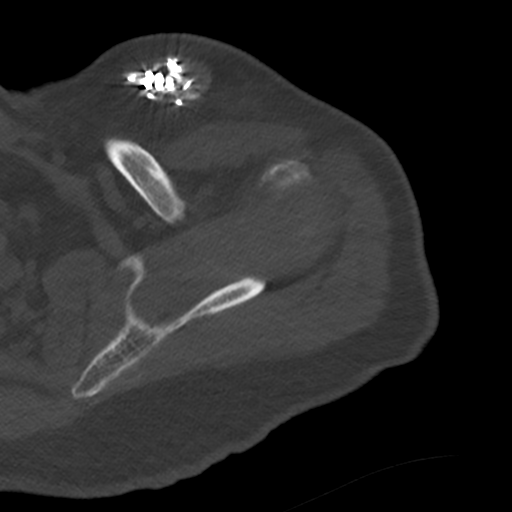
[im 64/76  bone]
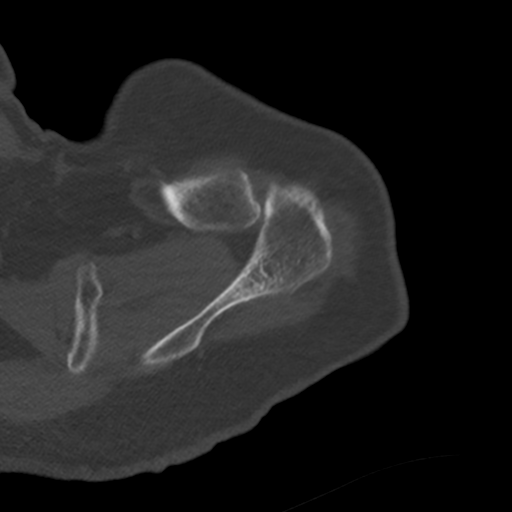
[im 70/76  bone]
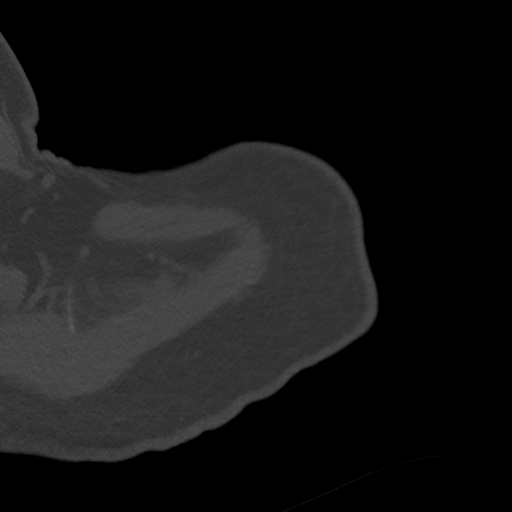

[12 of 14 positions shown; findings below may reference images not displayed]

FINDINGS: Pacemaker power pack in the left anterior chest wall at the level of
the glenohumeral joint with beam hardening artifact resulting from
the metallic hardware.

Rotator cuff: Small partial thickness articular surface tear of the
anterior supraspinatus tendon (image 54/series 6). Infraspinatus
tendon is intact. Teres minor tendon is intact. Subscapularis tendon
is intact.

Muscles: No muscle atrophy or edema. No intramuscular fluid
collection or hematoma.

Biceps Long Head: Intraarticular and extraarticular portions of the
biceps tendon are intact.

Acromioclavicular Joint: Mild arthropathy of the acromioclavicular
joint. Type II acromion. No subacromial/subdeltoid bursal fluid.

Glenohumeral Joint: Intraarticular contrast distending the joint
capsule. Normal glenohumeral ligaments. No chondral defect.

Labrum: Prominent superior labral sulcus.  No discrete labral tear.

Bones: No fracture or dislocation. No aggressive osseous lesion.

Other: No fluid collection or hematoma. Visualized portions of the
left lung demonstrate no focal abnormality.
IMPRESSION: 1. Small partial thickness articular surface tear of the anterior
supraspinatus tendon.

## 2020-11-21 NOTE — Progress Notes (Signed)
Remote ICD transmission.   

## 2020-11-26 ENCOUNTER — Other Ambulatory Visit: Payer: Self-pay | Admitting: Cardiology

## 2020-11-26 ENCOUNTER — Encounter: Payer: Self-pay | Admitting: Cardiology

## 2020-11-26 ENCOUNTER — Ambulatory Visit (INDEPENDENT_AMBULATORY_CARE_PROVIDER_SITE_OTHER): Payer: Medicare Other | Admitting: Cardiology

## 2020-11-26 VITALS — BP 90/70 | HR 70 | Ht 72.0 in | Wt 318.6 lb

## 2020-11-26 DIAGNOSIS — I4891 Unspecified atrial fibrillation: Secondary | ICD-10-CM | POA: Diagnosis not present

## 2020-11-26 DIAGNOSIS — I472 Ventricular tachycardia, unspecified: Secondary | ICD-10-CM

## 2020-11-26 DIAGNOSIS — I5022 Chronic systolic (congestive) heart failure: Secondary | ICD-10-CM | POA: Diagnosis not present
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
## 2020-11-26 NOTE — Patient Instructions (Addendum)
Your physician recommends that you schedule a follow-up appointment in: 4 MONTHS WITH DR [facility]  Your physician has recommended you make the following change in your medication:   STOP JARDIANCE   Thank you for choosing [facility]!!

## 2020-12-26 ENCOUNTER — Encounter (HOSPITAL_COMMUNITY): Payer: Self-pay | Admitting: Cardiology

## 2020-12-26 ENCOUNTER — Other Ambulatory Visit: Payer: Self-pay

## 2020-12-26 ENCOUNTER — Ambulatory Visit (HOSPITAL_COMMUNITY)
Admission: RE | Admit: 2020-12-26 | Discharge: 2020-12-26 | Disposition: A | Discharge status: Home / Self Care | Payer: Medicare Other | Source: Ambulatory Visit | Attending: Cardiology | Admitting: Cardiology

## 2020-12-26 VITALS — BP 100/60 | HR 72 | Wt 317.8 lb

## 2020-12-26 DIAGNOSIS — Z950 Presence of cardiac pacemaker: Secondary | ICD-10-CM | POA: Insufficient documentation

## 2020-12-26 DIAGNOSIS — I5022 Chronic systolic (congestive) heart failure: Secondary | ICD-10-CM | POA: Insufficient documentation

## 2020-12-26 DIAGNOSIS — Z7901 Long term (current) use of anticoagulants: Secondary | ICD-10-CM | POA: Insufficient documentation

## 2020-12-26 DIAGNOSIS — I428 Other cardiomyopathies: Secondary | ICD-10-CM | POA: Diagnosis not present

## 2020-12-26 DIAGNOSIS — Z8249 Family history of ischemic heart disease and other diseases of the circulatory system: Secondary | ICD-10-CM | POA: Diagnosis not present

## 2020-12-26 DIAGNOSIS — Z79899 Other long term (current) drug therapy: Secondary | ICD-10-CM | POA: Diagnosis not present

## 2020-12-26 DIAGNOSIS — I482 Chronic atrial fibrillation, unspecified: Secondary | ICD-10-CM | POA: Diagnosis not present

## 2020-12-26 LAB — BRAIN NATRIURETIC PEPTIDE: B Natriuretic Peptide: 811 pg/mL — ABNORMAL HIGH (ref 0.0–100.0)

## 2020-12-26 LAB — BASIC METABOLIC PANEL
Anion gap: 9 (ref 5–15)
BUN: 27 mg/dL — ABNORMAL HIGH (ref 6–20)
CO2: 26 mmol/L (ref 22–32)
Calcium: 9.8 mg/dL (ref 8.9–10.3)
Chloride: 103 mmol/L (ref 98–111)
Creatinine, Ser: 1.12 mg/dL (ref 0.61–1.24)
GFR, Estimated: >60 mL/min (ref >60)
Glucose, Bld: 105 mg/dL — ABNORMAL HIGH (ref 70–99)
Potassium: 4.7 mmol/L (ref 3.5–5.1)
Sodium: 138 mmol/L (ref 135–145)

## 2020-12-26 MED ORDER — FUROSEMIDE 40 MG PO TABS: ORAL_TABLET | ORAL | 3 refills | Status: DC

## 2020-12-26 NOTE — Patient Instructions (Signed)
Labs done today. We will contact you only if your labs are abnormal.  INCREASE Lasix to 40mg  (1 tablet) by mouth every morning and 20mg  (1/2 tablet) by mouth every evening.  No other medication changes were made. Please continue all current medications as prescribed.  Your physician recommends that you schedule a follow-up appointment in: 10 days for a lab only appointment at [facility] and in 3 months with Dr. have been referred to Cardiac Rehab. They will contact you to schedule an appointment.  If you have any questions or concerns before your next appointment please send a message through [facility] or call our office at [phone].    TO LEAVE A MESSAGE FOR THE NURSE SELECT OPTION 2, PLEASE LEAVE A MESSAGE INCLUDING: . YOUR NAME . DATE OF BIRTH . CALL BACK NUMBER . REASON FOR CALL**this is important as we prioritize the call backs  YOU WILL RECEIVE A CALL BACK THE SAME DAY AS LONG AS YOU CALL BEFORE 4:00 PM   Do the following things EVERYDAY: 1) Weigh yourself in the morning before breakfast. Write it down and keep it in a log. 2) Take your medicines as prescribed 3) Eat low salt foods-Limit salt (sodium) to 2000 mg per day.  4) Stay as active as you can everyday 5) Limit all fluids for the day to less than 2 liters   At the [facility], you and your health needs are our priority. As part of our continuing mission to provide you with exceptional heart care, we have created designated Provider Care Teams. These Care Teams include your primary Cardiologist (physician) and Advanced Practice Providers (APPs- Physician Assistants and Nurse Practitioners) who all work together to provide you with the care you need, when you need it.   You may see any of the following providers on your designated Care Team at your next follow up: [address] [phone] . Dr Marland Kitchen . Arvilla Meres, NP . Marca Ancona, PA . Tonye Becket, PharmD   Please be sure to  bring in all your medications bottles to every appointment.

## 2020-12-27 NOTE — Progress Notes (Addendum)
PCP: [name], MD Cardiology: Dr. Wyline Mood HF Cardiology: Dr. Shirlee Latch  55 y.o. with history of nonischemic cardiomyopathy and chronic atrial fibrillation was referred by Dr. Wyline Mood for CHF evaluation.  Patient was diagnosed with CHF about 10 years ago.  He has had persistently low EF.  Most recent echo in 2/19 showed EF 20-25%.  Cath in 1/16 showed no significant CAD.  He has a Secondary school teacher CRT-D device.  He has been in chronic atrial fibrillation for years now.  He had ablation x 2 with breakthrough AF, also failed Tikosyn and amiodarone. 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  No ETOH or smoking.    Echo (12/21) showed EF 20-25%, severe LV dilation, mild LVH, moderate RV enlargement with moderately decreased systolic function.   He is stable symptomatically.  Continue to work full time.  He has not tolerated CPAP.  Dyspnea if he walks up a hill. Does ok on flat ground and no trouble with 1 flight stairs.  No orthopnea/PND.  No chest pain. Weight stable.     St Jude device interrogation: 95% BiV pacing, stable thoracic impedance.   Labs (6/21): TSH normal, hgb 13.6, creatinine 0.97, LDL 97  ECG (personally reviewed): atrial fibrillation, BiV pacing  PMH: 1. Atrial fibrillation: Chronic. He has had ablation x 2 with breakthrough atrial fibrillation. He failed Tikosyn and amiodarone.  2. H/o VT 3. OSA: Tries to use CPAP.  4. Obesity 5. Chronic systolic CHF: Nonischemic cardiomyopathy. St Jude CRT-D device.  - Echo (2013): EF 40%.  - Echo (1/16): EF 15-20%.  - LHC (1/16): Normal coronaries.  - Echo (2/19): EF 20-25% - Echo (12/21): EF 20-25%, severe LV dilation, mild LVH, moderate RV enlargement with moderately decreased systolic function.  - CPX (12/21): RER 1.18, VE/VCO2 slope 29, peak VO2 13.1.  No significant HF limitation, primarily limited by deconditioning.   FH: Grandfather died of MI at 24.  SH: Married, retired Engineer, site, no ETOH or smoking,  lives in [place].   ROS: All systems reviewed and negative except as per HPI.   Current Outpatient Medications  Medication Sig Dispense Refill  . acetaminophen (TYLENOL) 500 MG tablet Take 1,000 mg by mouth daily as needed for mild pain or headache.     Marland Kitchen ENTRESTO 97-103 MG TAKE ONE TABLET BY MOUTH TWICE DAILY. 60 tablet 6  . metoprolol succinate (TOPROL-XL) 100 MG 24 hr tablet TAKE (1) TABLET BY MOUTH TWICE DAILY. TAKE WITH OR IMMEDIATELY FOLLOWING A MEAL. 60 tablet 6  . spironolactone (ALDACTONE) 25 MG tablet Take 1 tablet (25 mg total) by mouth daily. 90 tablet 3  . XARELTO 20 MG TABS tablet TAKE ONE TABLET BY MOUTH EVERY DAY WITH DINNER. 30 tablet 6  . furosemide (LASIX) 40 MG tablet Take 1 tablet (40 mg total) by mouth every morning AND 0.5 tablets (20 mg total) every evening. 135 tablet 3   No current facility-administered medications for this encounter.   BP 100/60   Pulse 72   Wt (!) 144.2 kg (317 lb 12.8 oz)   SpO2 99%   BMI 43.10 kg/m  General: NAD Neck: JVP 9-10, no thyromegaly or thyroid nodule.  Lungs: Clear to auscultation bilaterally with normal respiratory effort. CV: Nondisplaced PMI.  Heart irregular S1/S2, no S3/S4, no murmur.  Trace ankle edema.  No carotid bruit.  Normal pedal pulses.  Abdomen: Soft, nontender, no hepatosplenomegaly, no distention.  Skin: Intact without lesions or rashes.  Neurologic: Alert  and oriented x 3.  Psych: Normal affect. Extremities: No clubbing or cyanosis.  HEENT: Normal.   Assessment/Plan: 1. Chronic systolic CHF: Nonischemic cardiomyopathy x years.  St Jude BiV ICD.  Cath in 1/16 with no significant CAD.  Most recent echo in 12/21 with EF 20-25%.  CPX in 12/21 showed no definite HF limitation, primarily limited by deconditioning.  Cause of cardiomyopathy uncertain, ?viral myocarditis.  He does not have a strong family history of cardiomyopathy.  His pacemaker is not compatible with cardiac MRI.  He is in chronic atrial  fibrillation but has a reasonable BiV pacing percentage at 95%.  NYHA class III, though thoracic impedance is stable by [place], he looks volume overloaded by exam.   - Continue Toprol XL 100 mg bid.  - Continue Entresto 97/103 bid. - Continue spironolactone 25 mg daily.  - Unable to tolerate SGLT2 inhibitor due to yeast infections.  May try again after he has lost some weight.  - With volume overload on exam, increase Lasix to 40 qam/20 qpm.  BMET/BNP today and BMET 10 days.  - Screen for Batwire and vagal nerve stimulation (ANTHEM).  - Refer for cardiac rehab.  2. Atrial fibrillation: Chronic, he has failed ablation x 2, Tikosyn, and amiodarone.   Plan for rate-control.  - Continue Toprol XL.  - Continue Xarelto 20 mg daily.   Followup in 3 months.   Marca Ancona 12/27/2020

## 2021-01-05 ENCOUNTER — Encounter (HOSPITAL_COMMUNITY)
Admission: RE | Admit: 2021-01-05 | Discharge: 2021-01-05 | Disposition: A | Discharge status: Home / Self Care | Payer: Medicare Other | Source: Ambulatory Visit | Attending: Cardiology | Admitting: Cardiology

## 2021-01-05 ENCOUNTER — Other Ambulatory Visit: Payer: Self-pay

## 2021-01-05 ENCOUNTER — Encounter (HOSPITAL_COMMUNITY): Payer: Self-pay

## 2021-01-05 VITALS — BP 90/70 | HR 72 | Ht 72.0 in | Wt 318.3 lb

## 2021-01-05 DIAGNOSIS — I5022 Chronic systolic (congestive) heart failure: Secondary | ICD-10-CM | POA: Diagnosis not present

## 2021-01-05 NOTE — Progress Notes (Signed)
Cardiac/Pulmonary Rehab Medication Review by a Pharmacist  Does the patient  feel that his/her medications are working for him/her?  yes  Has the patient been experiencing any side effects to the medications prescribed?  no  Does the patient measure his/her own blood pressure or blood glucose at home?  yes   Does the patient have any problems obtaining medications due to transportation or finances?   No- Pharmacist offered coupon card with Entresto/Xarelto  Understanding of regimen: excellent Understanding of indications: excellent Potential of compliance: excellent  Questions asked to Determine Patient Understanding of Medication Regimen:  1. What is the name of the medication?  2. What is the medication used for?  3. When should it be taken?  4. How much should be taken?  5. How will you take it?  6. What side effects should you report?  Understanding Defined as: Excellent: All questions above are correct Good: Questions 1-4 are correct Fair: Questions 1-2 are correct  Poor: 1 or none of the above questions are correct   Pharmacist comments: Overall, patient has great understanding of medications and regimen. He has not issues or questions about his medications.    Brandon Torres 01/05/2021 1:07 PM

## 2021-01-05 NOTE — Progress Notes (Signed)
Cardiac Individual Treatment Plan  Patient Details  Name: Brandon Torres MRN: [id number] Date of Birth: November 27, 1965 Referring Provider:   Flowsheet Row CARDIAC REHAB PHASE II ORIENTATION from 01/05/2021 in [facility] CARDIAC REHABILITATION  Referring Provider Dr. Shirlee Latch      Initial Encounter Date:  Flowsheet Row CARDIAC REHAB PHASE II ORIENTATION from 01/05/2021 in [place] CARDIAC REHABILITATION  Date 01/05/21      Visit Diagnosis: Chronic systolic congestive heart failure (HCC)  Patient's Home Medications on Admission:  Current Outpatient Medications:  .  acetaminophen (TYLENOL) 500 MG tablet, Take 1,000 mg by mouth daily as needed for mild pain or headache. , Disp: , Rfl:  .  ENTRESTO 97-103 MG, TAKE ONE TABLET BY MOUTH TWICE DAILY., Disp: 60 tablet, Rfl: 6 .  furosemide (LASIX) 40 MG tablet, Take 1 tablet (40 mg total) by mouth every morning AND 0.5 tablets (20 mg total) every evening., Disp: 135 tablet, Rfl: 3 .  metoprolol succinate (TOPROL-XL) 100 MG 24 hr tablet, TAKE (1) TABLET BY MOUTH TWICE DAILY. TAKE WITH OR IMMEDIATELY FOLLOWING A MEAL., Disp: 60 tablet, Rfl: 6 .  spironolactone (ALDACTONE) 25 MG tablet, Take 1 tablet (25 mg total) by mouth daily., Disp: 90 tablet, Rfl: 3 .  XARELTO 20 MG TABS tablet, TAKE ONE TABLET BY MOUTH EVERY DAY WITH DINNER., Disp: 30 tablet, Rfl: 6  Past Medical History: Past Medical History:  Diagnosis Date  . AICD (automatic cardioverter/defibrillator) present   . CHF (congestive heart failure) (HCC)   . Chronic anticoagulation   . Dysrhythmia    AFib  . Morbid obesity (HCC)   . Non-ischemic cardiomyopathy (HCC)   . OSA on CPAP    "have mask; can't tolerate mask" (04/23/2015)  . PAF (paroxysmal atrial fibrillation) (HCC)    RFA at [facility]+ dofetilide  . PONV (postoperative nausea and vomiting)    nausea  . Ventricular tachycardia (HCC)   . Wears glasses     Tobacco Use: Social History   Tobacco Use  Smoking Status Never Smoker   Smokeless Tobacco Never Used    Labs: Recent Review Flowsheet Data    Labs for ITP Cardiac and Pulmonary Rehab Latest Ref Rng & Units 01/04/2012 11/12/2014 11/12/2014 10/06/2015 03/29/2018   Cholestrol 0 - 200 192 - - 193 174   LDLCALC - 133(H) - - 129(H) 114   HDL >40 mg/dL 46 - - 48 -   Trlycerides 40 - 160 66 - - 81 82   Hemoglobin A1c 4.8 - 5.6 % - - - 5.4 -   PHART 7.350 - 7.450 - - 7.361 - -   PCO2ART 35.0 - 45.0 mmHg - - 40.6 - -   HCO3 20.0 - 24.0 mEq/L - 23.6 23.0 - -   TCO2 0 - 100 mmol/L - 25 24 - -   ACIDBASEDEF 0.0 - 2.0 mmol/L - 2.0 2.0 - -   O2SAT % - 69.0 98.0 - -      Capillary Blood Glucose: No results found for: GLUCAP   Exercise Target Goals: Exercise Program Goal: Individual exercise prescription set using results from initial 6 min walk test and THRR while considering  patient's activity barriers and safety.   Exercise Prescription Goal: Starting with aerobic activity 30 plus minutes a day, 3 days per week for initial exercise prescription. Provide home exercise prescription and guidelines that participant acknowledges understanding prior to discharge.  Activity Barriers & Risk Stratification:   6 Minute Walk:  6 Minute Walk  Row Name 01/05/21 1355         6 Minute Walk   Phase Initial     Distance 1400 feet     Walk Time 6 minutes     # of Rest Breaks 0     MPH 2.7     METS 3.07     RPE 12     VO2 Peak 10.74     Symptoms No     Resting HR 72 bpm     Resting BP 90/70     Resting Oxygen Saturation  96 %     Exercise Oxygen Saturation  during 6 min walk 94 %     Max Ex. HR 116 bpm     Max Ex. BP 110/72     2 Minute Post BP 86/70            Oxygen Initial Assessment:   Oxygen Re-Evaluation:   Oxygen Discharge (Final Oxygen Re-Evaluation):   Initial Exercise Prescription:  Initial Exercise Prescription - 01/05/21 1300      Date of Initial Exercise RX and Referring Provider   Date 01/05/21    Referring Provider Dr. Shirlee Latch     Expected Discharge Date 04/01/21      Treadmill   MPH 1.5    Grade 0    Minutes 22      NuStep   Level 1    SPM 80    Minutes 17      Prescription Details   Frequency (times per week) 3    Duration Progress to 30 minutes of continuous aerobic without signs/symptoms of physical distress      Intensity   THRR 40-80% of Max Heartrate 67-134    Ratings of Perceived Exertion 11-13      Progression   Progression Continue to progress workloads to maintain intensity without signs/symptoms of physical distress.      Resistance Training   Training Prescription Yes    Weight 4    Reps 10-15           Perform Capillary Blood Glucose checks as needed.  Exercise Prescription Changes:   Exercise Comments:   Exercise Goals and Review:   Exercise Goals    Row Name 01/05/21 1358             Exercise Goals   Increase Physical Activity Yes       Intervention Develop an individualized exercise prescription for aerobic and resistive training based on initial evaluation findings, risk stratification, comorbidities and participant's personal goals.;Provide advice, education, support and counseling about physical activity/exercise needs.       Expected Outcomes Short Term: Attend rehab on a regular basis to increase amount of physical activity.;Long Term: Add in home exercise to make exercise part of routine and to increase amount of physical activity.;Long Term: Exercising regularly at least 3-5 days a week.       Increase Strength and Stamina Yes       Intervention Provide advice, education, support and counseling about physical activity/exercise needs.;Develop an individualized exercise prescription for aerobic and resistive training based on initial evaluation findings, risk stratification, comorbidities and participant's personal goals.       Expected Outcomes Short Term: Increase workloads from initial exercise prescription for resistance, speed, and METs.;Short Term: Perform  resistance training exercises routinely during rehab and add in resistance training at home;Long Term: Improve cardiorespiratory fitness, muscular endurance and strength as measured by increased METs and functional capacity ( )  Able to understand and use rate of perceived exertion (RPE) scale Yes       Intervention Provide education and explanation on how to use RPE scale       Expected Outcomes Short Term: Able to use RPE daily in rehab to express subjective intensity level;Long Term:  Able to use RPE to guide intensity level when exercising independently       Knowledge and understanding of Target Heart Rate Range (THRR) Yes       Intervention Provide education and explanation of THRR including how the numbers were predicted and where they are located for reference       Expected Outcomes Short Term: Able to state/look up THRR;Long Term: Able to use THRR to govern intensity when exercising independently;Short Term: Able to use daily as guideline for intensity in rehab       Able to check pulse independently Yes       Intervention Provide education and demonstration on how to check pulse in carotid and radial arteries.;Review the importance of being able to check your own pulse for safety during independent exercise       Expected Outcomes Short Term: Able to explain why pulse checking is important during independent exercise;Long Term: Able to check pulse independently and accurately       Understanding of Exercise Prescription Yes       Intervention Provide education, explanation, and written materials on patient's individual exercise prescription       Expected Outcomes Short Term: Able to explain program exercise prescription;Long Term: Able to explain home exercise prescription to exercise independently              Exercise Goals Re-Evaluation :    Discharge Exercise Prescription (Final Exercise Prescription Changes):   Nutrition:  Target Goals: Understanding of nutrition  guidelines, daily intake of sodium 1500mg , cholesterol 200mg , calories 30% from fat and 7% or less from saturated fats, daily to have 5 or more servings of fruits and vegetables.  Biometrics:  Pre Biometrics - 01/05/21 1359      Pre Biometrics   Height 6' (1.829 m)    Weight 144.4 kg    Waist Circumference 51 inches    Hip Circumference 52 inches    Waist to Hip Ratio 0.98 %    BMI (Calculated) 43.17    Triceps Skinfold 12 mm    % Body Fat 36.2 %    Grip Strength 47.7 kg    Flexibility 18 in    Single Leg Stand 46 seconds            Nutrition Therapy Plan and Nutrition Goals:  Nutrition Therapy & Goals - 01/05/21 1400      Personal Nutrition Goals   Comments Patient scored 50 on his diet assessment. He says he does the cooking most of the time and he trys to follow a low fat heart healthy diet. Assessment score discussed and handout regrading healthy choices provided and reviewed.      Intervention Plan   Intervention Nutrition handout(s) given to patient.           Nutrition Assessments:  Nutrition Assessments - 01/05/21 1402      MEDFICTS Scores   Pre Score 50          MEDIFICTS Score Key:  ?70 Need to make dietary changes   40-70 Heart Healthy Diet  ? 40 Therapeutic Level Cholesterol Diet   Picture Your Plate Scores:  <16 Unhealthy dietary pattern with much room  for improvement.  41-50 Dietary pattern unlikely to meet recommendations for good health and room for improvement.  51-60 More healthful dietary pattern, with some room for improvement.   >60 Healthy dietary pattern, although there may be some specific behaviors that could be improved.    Nutrition Goals Re-Evaluation:   Nutrition Goals Discharge (Final Nutrition Goals Re-Evaluation):   Psychosocial: Target Goals: Acknowledge presence or absence of significant depression and/or stress, maximize coping skills, provide positive support system. Participant is able to verbalize types  and ability to use techniques and skills needed for reducing stress and depression.  Initial Review & Psychosocial Screening:  Initial Psych Review & Screening - 01/05/21 1408      Initial Review   Current issues with None Identified      Family Dynamics   Good Support System? Yes    Comments Patient names his wife as his support system. He has a very positive outlook regarding his future and is glad to be doing the program. Will continue to monitor.      Barriers   Psychosocial barriers to participate in program There are no identifiable barriers or psychosocial needs.      Screening Interventions   Interventions Encouraged to exercise    Expected Outcomes Short Term goal: Utilizing psychosocial counselor, staff and physician to assist with identification of specific Stressors or current issues interfering with healing process. Setting desired goal for each stressor or current issue identified.;Long Term Goal: Stressors or current issues are controlled or eliminated.;Short Term goal: Identification and review with participant of any Quality of Life or Depression concerns found by scoring the questionnaire.           Quality of Life Scores:  Quality of Life - 01/05/21 1400      Quality of Life   Select Quality of Life      Quality of Life Scores   Health/Function Pre 20.13 %    Socioeconomic Pre 20.93 %    Psych/Spiritual Pre 21.79 %    Family Pre 22.5 %    GLOBAL Pre 20.94 %          Scores of 19 and below usually indicate a poorer quality of life in these areas.  A difference of  2-3 points is a clinically meaningful difference.  A difference of 2-3 points in the total score of the Quality of Life Index has been associated with significant improvement in overall quality of life, self-image, physical symptoms, and general health in studies assessing change in quality of life.  PHQ-9: Recent Review Flowsheet Data    Depression screen [facility] 2/9 01/05/2021 07/24/2019 09/10/2016  06/17/2016   Decreased Interest 0 0 0 0   Down, Depressed, Hopeless 0 0 0 0   PHQ - 2 Score 0 0 0 0   Altered sleeping 0 - 0 0   Tired, decreased energy 1 - 0 1   Change in appetite 0 - 0 0   Feeling bad or failure about yourself  0 - 0 0   Trouble concentrating 0 - 0 0   Moving slowly or fidgety/restless 0 - 0 0   Suicidal thoughts 0 - 0 0   PHQ-9 Score 1 - 0 1   Difficult doing work/chores Not difficult at all - - Not difficult at all     Interpretation of Total Score  Total Score Depression Severity:  1-4 = Minimal depression, 5-9 = Mild depression, 10-14 = Moderate depression, 15-19 = Moderately severe depression, 20-27 =  Severe depression   Psychosocial Evaluation and Intervention:  Psychosocial Evaluation - 01/05/21 1411      Psychosocial Evaluation & Interventions   Interventions Stress management education;Relaxation education;Encouraged to exercise with the program and follow exercise prescription    Comments Patient has no psychosocial issues identified at his orientation visit. His initial QOL score was 20.94 overall and his PHQ-9 score was 1. Will continue to monitor.    Expected Outcomes Patient will have no psychosocial issues identified at discharge.    Continue Psychosocial Services  No Follow up required           Psychosocial Re-Evaluation:   Psychosocial Discharge (Final Psychosocial Re-Evaluation):   Vocational Rehabilitation: Provide vocational rehab assistance to qualifying candidates.   Vocational Rehab Evaluation & Intervention:  Vocational Rehab - 01/05/21 1405      Vocational Rehab Re-Evaulation   Comments Patient is disabled and does not plan to return to work. He does not need vocational rehab.           Education: Education Goals: Education classes will be provided on a weekly basis, covering required topics. Participant will state understanding/return demonstration of topics presented.  Learning Barriers/Preferences:  Learning  Barriers/Preferences - 01/05/21 1402      Learning Barriers/Preferences   Learning Barriers None    Learning Preferences Audio;Written Material;Skilled Demonstration           Education Topics: Hypertension, Hypertension Reduction -Define heart disease and high blood pressure. Discus how high blood pressure affects the body and ways to reduce high blood pressure. Flowsheet Row CARDIAC REHAB PHASE II EXERCISE [facility] [facility]  Date 08/25/16  Educator DC  Instruction Review Code (retired) 2- meets goals/outcomes      Exercise and Your Heart -Discuss why it is important to exercise, the FITT principles of exercise, normal and abnormal responses to exercise, and how to exercise safely. Flowsheet Row CARDIAC REHAB PHASE II EXERCISE [facility] [place] [facility]  Date 09/01/16  Educator Hart Rochesteriane Coad  Instruction Review Code (retired) 2- meets goals/outcomes      Angina -Discuss definition of angina, causes of angina, treatment of angina, and how to decrease risk of having angina. Flowsheet Row CARDIAC REHAB PHASE II EXERCISE from 09[place]/2016 [place] [facility]  Date 09/08/16  Educator Hart Rochesteriane Coad  Instruction Review Code 2- meets goals/outcomes      Cardiac Medications -Review what the following cardiac medications are used for, how they affect the body, and side effects that may occur when taking the medications.  Medications include Aspirin, Beta blockers, calcium channel blockers, ACE Inhibitors, angiotensin receptor blockers, diuretics, digoxin, and antihyperlipidemics.   Congestive Heart Failure -Discuss the definition of CHF, how to live with CHF, the signs and symptoms of CHF, and how keep track of weight and sodium intake.   Heart Disease and Intimacy -Discus the effect sexual activity has on the heart, how changes occur during intimacy as we age, and safety during sexual activity.   Smoking  Cessation / COPD -Discuss different methods to quit smoking, the health benefits of quitting smoking, and the definition of COPD. Flowsheet Row CARDIAC REHAB PHASE II EXERCISE [place]2016 [place] [facility]  Date 07/07/16  Educator DJ  Instruction Review Code (retired) 2- meets goals/outcomes      Nutrition I: Fats -Discuss the types of cholesterol, what cholesterol does to the heart, and how cholesterol levels can be controlled. Flowsheet Row CARDIAC REHAB PHASE II EXERCISE from  09/08/2016 in [facility]  Date 07/14/16  Educator Hart Rochester  Instruction Review Code 2- meets goals/outcomes      Nutrition II: Labels -Discuss the different components of food labels and how to read food label Flowsheet Row CARDIAC REHAB PHASE II EXERCISE from 09/08/2016 in [place] CARDIAC REHABILITATION  Date 07/21/16  Educator Hart Rochester  Instruction Review Code 2- meets goals/outcomes      Heart Parts/Heart Disease and PAD -Discuss the anatomy of the heart, the pathway of blood circulation through the heart, and these are affected by heart disease. Flowsheet Row CARDIAC REHAB PHASE II EXERCISE from 09/08/2016 in [place] CARDIAC REHABILITATION  Date 07/28/16  Educator DC  Instruction Review Code 2- meets goals/outcomes      Stress I: Signs and Symptoms -Discuss the causes of stress, how stress may lead to anxiety and depression, and ways to limit stress. Flowsheet Row CARDIAC REHAB PHASE II EXERCISE from 09/08/2016 in [place] CARDIAC REHABILITATION  Date 08/04/16  Educator Hart Rochester  Instruction Review Code 2- meets goals/outcomes      Stress II: Relaxation -Discuss different types of relaxation techniques to limit stress. Flowsheet Row CARDIAC REHAB PHASE II EXERCISE from 09/08/2016 in [place] CARDIAC REHABILITATION  Date 08/11/16  Educator Hart Rochester  Instruction Review Code 2- meets goals/outcomes      Warning Signs of  Stroke / TIA -Discuss definition of a stroke, what the signs and symptoms are of a stroke, and how to identify when someone is having stroke. Flowsheet Row CARDIAC REHAB PHASE II EXERCISE from 09/08/2016 in [place] CARDIAC REHABILITATION  Date 08/18/16  Educator DJ  Instruction Review Code 2- meets goals/outcomes      Knowledge Questionnaire Score:  Knowledge Questionnaire Score - 01/05/21 1404      Knowledge Questionnaire Score   Pre Score 22/24           Core Components/Risk Factors/Patient Goals at Admission:  Personal Goals and Risk Factors at Admission - 01/05/21 1405      Core Components/Risk Factors/Patient Goals on Admission    Weight Management Obesity;Weight Loss;Yes    Admit Weight 311 lb (141.1 kg)    Goal Weight: Short Term 306 lb (138.8 kg)    Goal Weight: Long Term 301 lb (136.5 kg)    Heart Failure Yes    Intervention Provide a combined exercise and nutrition program that is supplemented with education, support and counseling about heart failure. Directed toward relieving symptoms such as shortness of breath, decreased exercise tolerance, and extremity edema.    Expected Outcomes Improve functional capacity of life;Short term: Attendance in program 2-3 days a week with increased exercise capacity. Reported lower sodium intake. Reported increased fruit and vegetable intake. Reports medication compliance.;Short term: Daily weights obtained and reported for increase. Utilizing diuretic protocols set by physician.;Long term: Adoption of self-care skills and reduction of barriers for early signs and symptoms recognition and intervention leading to self-care maintenance.    Personal Goal Other Yes    Personal Goal Lose 10 to 15 lbs in the program and 60 to 70 lbs long term.    Intervention Provide cardiac rehab program supplemented with exercise and lifestyle changes at home.    Expected Outcomes Patient will complete the program meeting both personal and program goals.            Core Components/Risk Factors/Patient Goals Review:    Core Components/Risk Factors/Patient Goals at Discharge (Final Review):    ITP Comments:  Comments: Patient arrived for 1st visit/orientation/education at 1230. Patient was referred to CR by Marca Ancona due to Chronic Systolic CHF. During orientation advised patient on arrival and appointment times what to wear, what to do before, during and after exercise. Reviewed attendance and class policy.  Pt is scheduled to return Cardiac Rehab on 01/12/21 at 8:15. Pt was advised to come to class 15 minutes before class starts.  Discussed RPE/Dpysnea scales. Patient participated in warm up stretches. Patient was able to complete 6 minute walk test. Telemetry:Ventricular paced rhythm. Patient was measured for the equipment. Discussed equipment safety with patient. Took patient pre-anthropometric measurements. Patient finished visit at 1400.

## 2021-01-07 ENCOUNTER — Other Ambulatory Visit: Payer: Self-pay

## 2021-01-07 DIAGNOSIS — Z006 Encounter for examination for normal comparison and control in clinical research program: Secondary | ICD-10-CM

## 2021-01-07 NOTE — Addendum Note (Signed)
Encounter addended by: Laurey Morale, MD on: 01/07/2021 11:10 PM  Actions taken: Clinical Note Signed

## 2021-01-07 NOTE — Research (Addendum)
ANTHEM Informed Consent   Subject Name: Brandon Torres  Subject met inclusion and exclusion criteria.  The informed consent form, study requirements and expectations were reviewed with the subject and questions and concerns were addressed prior to the signing of the consent form.  The subject verbalized understanding of the trial requirements.  The subject agreed to participate in the ANTHEM trial and signed the informed consent at [place] on 01/07/2021.  The informed consent was obtained prior to performance of any protocol-specific procedures for the subject.  A copy of the signed informed consent was given to the subject and a copy was placed in the subject's medical record.   Sherlyn Lees

## 2021-01-08 ENCOUNTER — Other Ambulatory Visit: Payer: Self-pay | Admitting: Cardiology

## 2021-01-08 DIAGNOSIS — Z7689 Persons encountering health services in other specified circumstances: Secondary | ICD-10-CM | POA: Diagnosis not present

## 2021-01-08 NOTE — Research (Addendum)
Cardiopulmonary Exam  Was the CP Exam Performed? [x]  Yes (Complete Below) []  No (Complete Protocol Deviation)   Was the assessment collected on the same date as the Visit Date?  Date CP Exam Performed (dd/MMM/yyyy):  [x]  Yes []  No      Heart Sounds  S3: [x]  Absent []  Present   S4: [x]  Absent []  Present  Murmur  Murmur: []  Yes [x]  No   If Yes:  []  Systolic []  Diastolic   If yes select grade: []  I/VI []  II/VI []  III/VI []  IV/VI []  V/VI []  VI/VI   Chest Sounds  Rales: []  Yes [x]  No   If yes check one: []  ? 1/3 of lung fields full []  1/2 of lung fields full []  > 1/2 of lung fields full  If yes check one: []  Left []  Right []  Bilateral    Edema   Edema (pedal): []  Absent [x]  Trace []  1+ (slight) []  2+ (moderate) []  3+ (severe)    Hepatomegaly  Hepatomegaly: [x]  None []  < 2 cm []  2-4 cm []  > 4 cm  Peripheral Pulses  Peripheral Pulses: [x]  Normal []  Abnormal   If Abnormal, explain:        Physical Exam    Physical Examination      Was the physical exam performed?    Was the assessment collected on the same date as the Visit Date?    If No - Date of Echo (dd/MMM/yyyy):    General Appearance     If abnormal, clinically significant?   Describe Clinically Relevant abnormalities:    Ophthalmologic     If abnormal, clinically significant?   Describe Clinically Relevant abnormalities:     HEENT     If abnormal, clinically significant?   Describe Clinically Relevant abnormalities:     Neurologic     If abnormal, clinically significant?  Describe Clinically Relevant abnormalities:     Gastrointestinal     If abnormal, clinically significant?   Describe Clinically Relevant abnormalities:    Dermatologic     If abnormal, clinically significant?  Describe Clinically Relevant abnormalities:     Musculoskeletal    If  abnormal, clinically significant?    Describe Clinically Relevant abnormalities:         Other [x]  Yes []  No (complete a protocol deviation)   [x]  Yes []  No       []  Normal []  Abnormal []  Not Done  []  Yes []  No       [x]  Normal []  Abnormal []  Not Done  []  Yes []  No        [x]  Normal []  Abnormal []  Not Done  []  Yes []  No         [x] Normal  [] Abnormal  [] Not Done   [] Yes  [] No         [x] Normal  [] Abnormal  [] Not Done      [] Yes  [] No         [x] Normal  [] Abnormal  [] Not Done    [] Yes  [] No          [x] Normal  [] Abnormal  [] Not Done    [] Yes  [] No          [x] Normal  [] Abnormal  [] Not Done   [] Yes  [] No           Other, specify:      Result:     If abnormal, clinically significant?    Describe Clinically Relevant abnormalities     [x]  Normal []   Abnormal []  Not Done  []  Yes []  No

## 2021-01-08 NOTE — Research (Addendum)
Visit Date                                                               [facility] [id number]   Visit Date    01/07/2021     Enrollment   Screening Number (calculated by [facility]): [id number]  Protocol Version:  _0  Version 9.2       Type of Study Site:  _1  Cardiac Transplant _2  Non Cardiac Transplant   CRT or CRT-D Recipient  _3  Yes _4  No   Demographics    DOB:dd/MMM/yyyy     07-30-1966       Gender:   male    Ethnicity:Not Hispanic or Latino Not Hispanic or Latino     Race: _5  Black or African American _6  American [place] or [place] Native _7  Asian _8  [place] or Other Pacific Islander _9  White _10  Other   Specify Other:          Informed Consent    Date and Time Informed Consent Signed (dd/MMM/yyyy HH:mm 24 hour clock):     01/07/2021 0820      Inclusion / Exclusion Criteria   Inclusion Criteria: All Items must be answered YES for the subject to be eligible  1. Age 53 or above  _11 Yes  _12 No   2. Willing and capable of providing informed consent  _13 Yes  _14 No   3. Capable of participating in all testing associated with this clinical investigation  _15 Yes  _16 No      4. Stable, guideline-directed medical therapy for at least 4 weeks before subject screening. Unrestricted changes in diuretics are allowed during the 4 weeks, as long as the subject remains on a diuretic. If the use of an ARNI is being contemplated for a study subject, ARNI should be administered, and GDMT optimized, before the subject is randomized. No more than a 100% increase or 50% decrease of the dosage of any medication other than a diuretic is permitted. For these medications, medication changes within a class are allowed, as long as the equivalent dosage is within these specified limits.  _17  Yes _18  No  5. Stable symptomatic heart failure NYHA class III;                               OR  NYHA class II with a heart failure  hospitalization in the previous 12 months. HF hospitalization may include an overnight hospital or hospital-based observation unit stay with a primary diagnosis of HF, or an emergency department visit with a primary diagnosis of HF, and will in either case include documentation of intravenous HF therapy administration or other intervention for HF.             Is there documentation of IV therapy for    HF or other intervention for HF (REQUIRED)  _19  Yes _20  No   _21  Yes _22  No      _23  Yes _24  No   6. Left ventricular ejection fraction (EF) ? 35% and left ventricular end-diastolic diameter (LVEDD) <  8.0 cm, as confirmed by the core echocardiography laboratory during screening  _0   Yes _1  No  7. N-terminal pro-BNP (NT-proBNP) level of at least 800 pg/mL, as determined by the core laboratory OR NT-proBNP level of at least 1200 pg/mL, as determined by the core laboratory, for patients with permanent atrial fibrillation or reporting signs or symptoms of atrial fibrillation at the time that the NT-proBNP sample is drawn. _2  Yes _3  No  _4  Yes _5  No      8. Received a standard cardiac assessment, including history, physical exam, and electrocardiogram, and determined by a heart failure cardiologist and study surgeon to be an appropriate candidate for the study's surgical procedure  _6  Yes _7  No  9. Physically capable and willing to perform repeated 6-minute walk tests associated with the study, and having a baseline distance of between 150 and 450 meters. Symptoms limiting the duration of the 6-minute walk test must be due primarily to heart failure.   _8  Yes _9  No    Exclusion Criteria: All Items must be answered NO or NA for the subject to be eligible   1. Refractory symptomatic hypotension (systolic blood pressure below 80 mmHg)  _10  Yes _11  No   2. Pacemaker therapy that utilizes unilateral ventricular pacing with a right ventricular lead for complete AV block  3. CRT or  CRT-D therapy for less than 6 months  _12  Yes _13  No  _14  Yes _15  No   4. Currently implanted vagus nerve stimulation (VNS) device, baroreceptor activation therapy (BAT) device, other nerve stimulator, artificial or donor heart, or ventricular assist device (VAD)  _16  Yes _17  No  5. Ablation of atrial fibrillation in the past 3 months  _18  Yes _19  No   6. Within the past 6 weeks:    a. Pacemaker or implantable cardioverter defibrillator (ICD) implantation _20  Yes _21  No   b. Hospitalization for cardiovascular reasons, such as heart failure, myocardial infarction, transient ischemic attack, unstable angina, syncope, cerebral vascular accident,; or for cardiovascular interventions, such as percutaneous coronary intervention (PCI; angioplasty or stent placement), valve repair or replacement, coronary artery bypass graft (CABG), or aborted sudden cardiac death _22  Yes _23  No            c. Epigastric or upper gastrointestinal bleeding  _24  Yes _25  No     7. Scheduled, or likely to be scheduled within the next 3 months for:  a. Cardiac contractility modulation (CCM), CRT, VNS, BAT or other device implantation for improving ventricular function; or non-cardiac organ transplant      _26  Yes _27  No   b. A therapeutic cardiovascular procedure (including but not limited to PCI, CABG, valve replacement or repair, aorta surgery, or ablation for arrhythmia management) _28  Yes _29  No    8. Scheduled, or likely to be scheduled, within next 12 months for implantation of artificial or donor heart, or ventricular assist device (VAD)       _30  Yes _31  No   9. Heart failure of non-ischemic origin for less than 6 months, or due to congenital heart disease, hypertrophic obstructive cardiomyopathy, or infiltrative cardiomyopathy (e.g. amyloidosis, sarcoidosis)  10. Moderate (3+) or severe (4+) aortic valve or   mitral valve stenosis; moderate (3+) or severe (4+) aortic valve insufficiency; or  severe (4+) mitral valve insufficiency _32  Yes _33  No   _34  Yes _35  No   11. Symptomatic uncontrolled bradycardia   _36  Yes _37  No   12. Current treatment with intravenous inotropic therapy _38  Yes _39  No  13. Recurrent vasovagal syncope or orthostatic intolerance causing uncontrolled falls or injuries  _0  Yes _1  No    14. Known carotid stenosis (>50% narrowing), or carotid bruit without confirmed absence of carotid stenosis  _2  Yes _3  No      15. Previous neck surgery and resultant scar formation that interferes with the ability to implant the study system  _4  Yes _5  No   16. Known recurrent nerve paralysis or a prior vagotomy _6  Yes _7  No   17. Presence of any other disease with an estimated life expectancy of less than 12 months _8  Yes _9  No   18. A primary lung condition, such as asthma, restrictive lung disease, or obstructive lung disease, that, in the clinician's judgment, would limit walk test to less than 6 minutes or would limit walk distance over a period of 6 minutes; history of severe chronic obstructive pulmonary disease (COPD) (e.g., FEV1 < 1.5 L); or daytime oxygen dependence  _10  Yes _11  No   19. Severe vertebral cervical disease and limited mobility in the neck; includes those that frequently wear a brace _12  Yes _13  No   20. Malignancy with current or anticipated surgical or other treatment, excluding basal cell carcinoma _14  Yes _15  No   21. Known or suspected non-compliance with medical therapy  _16  Yes _17  No  22. Women of childbearing potential who are or might be pregnant at the time of the study or breastfeeding _18  Yes _19  No _20  N/A   23. Involvement in any concurrent clinical study with an investigational therapy _21  Yes _22  No       Echo   Was the Echo acquired? _23  Yes (Complete Below) _24  No (Complete Protocol Deviation)   Was the assessment collected on the same date as the Visit Date?   _25  Yes _26  No     If No - Date  of Echo (dd/MMM/yyyy): 01/14/2021    Was the eligibility criterion met according to the core lab?   _27  Yes (Left Ventricular Ejection Fraction meets eligibility criteria of ? 35% and left ventricular end-diastolic diameter (LVEDD) < 8.0 cm)  _28  No (Left Ventricular Ejection Fraction does not meet eligibility criteria of ? 35% or  left ventricular end-diastolic diameter is not (LVEDD) < 8.0 cm,  .)    Send the Echo to the Core Lab as soon after acquisition as possible.       NTproBNP Eligibility  NTproBNP -       Was the assessment collected on the same date as the Visit Date?   _29  Yes _30  No      If No - Date blood sample drawn (dd/MMM/yyyy):         Result (pg/mL):    4028              Vital Signs  Vital Signs  Were Vital Signs Collected?  _31 Yes (Complete Below)  _32 No (Complete Protocol Deviation)   Was the assessment collected on the same date as the Visit Date?  _33 Yes  _34 No   If no -Date of vital signs (dd/MMM/yyyy):    Systolic Blood Pressure (mmHg):    91   Diastolic Blood Pressure (mmHg):    68   Temperature:    97.3    Temperature Units:  _35 Celsius  _36 Fahrenheit  Weight: 318     Units:  _37 Kilograms  _38 Pounds    Height: 72     Units:  _39 centimeters  _40 inches     Heart Rate (bpm): 71  Cardiovascular History   Date cardiovascular history taken (dd/MMM/yyyy):    01/07/2021      Hypertension    _0 No History  _1 History  _2 Unknown   Congestive Heart Failure    _3 No History  _4 History  _5 Unknown   Etiology:  _6 Ischemic  _7 Non-ischemic   Valvular Dysfunction    _8 No History  _9 History  _10 Unknown    Valvular Dysfunction History:  _11  Mitral Stenosis _12  Mitral Regurgitation _13  Valvuloplasty _14  Aortic Regurgitation _15  Aortic Stenosis _16  Other     Other Specify:      Aortic Stenosis Grade:  _17  Mild _18  Moderate _19  Severe     Arrhythmias   _20  No History _21  History _22  Unknown    Arrhythmias History:       AV Block Degree     _23   Atrial        Permanent Atrial Fibrillation  _24  Yes  _25  No  _26   Ventricular _27   AV Block _28   Other   _29  1 _30  2 _31  3    Other Specify:       Previous CABG   Previous CABG:  _32  No History _33  History _34  Unknown     Date of Most Recent (dd/MMM/yyyy):           Previous PCI   Previous PCI: _35  No History _36  History _37  Unknown  Date of Most Recent (dd/MMM/yyyy):      Previous MI   Previous MI: _38  No History _39  History _40  Unknown  Date of Most Recent (dd/MMM/yyyy):       Stroke   Stroke: _41  No History _42  History _43  Unknown  Date of Most Recent (dd/MMM/yyyy):       EP   EP Devices:  _44 No History  _45 History  _46 Unknown   EP Type (check all that apply):  _47  ICD _48  CRT  _49  Permanent Pacemaker _50  CRT-D _51  CCM   ICD Date (dd/MMM/yyyy):       CRT Date (dd/MMM/yyyy):  16/APR/2018     Permanent Pacemaker Date (dd/MMM/yyyy):       Diabetes Mellitus   Diabetes mellitus: _52  No History _53  History _54  Unknown  Other Significant Cardiovascular History   Other Significant Cardiovascular History: _55  No History _56  History _57  Unknown  Specify History:      Smoking History   Smoking History: _58  No History _59  History _60  Unknown   Ongoing:  _61  Yes _62  No  If No, Year Stopped (yyyy):      Medical History Summary  Add one Clinically Relevant Condition per line. Place Current Physical Exam Findings on the Physical Exam Form.   Date medical history taken (dd/MMM/yyyy):         HEENT   HEENT  _63  None   Clinically Relevant Condition: Wears glasses  Ongoing:  _64 Yes  _65 No  Dermatologic   Dermatologic:  _66  None   Clinically Relevant Condition:   Ongoing: _67  Yes _68  No  Lungs / Respiratory   Lungs/Respiratory:   _69  None  Clinically Relevant Condition: Obstructive sleep apnea  Ongoing: _70  Yes _71  No  Gastrointestinal   Gastrointestinal:  _72  None    Clinically Relevant Condition: Post operative nausea and vomiting  Ongoing: _73  Yes _74  No  Urinary / Renal   Urinary/Renal:  Clinically Relevant Condition: Ongoing:  _75  None    _76  Yes _77  No  Musculoskeletal   Musculoskeletal:  _78  None    Clinically Relevant Condition:    Ongoing:  _79  Yes _80  No    Neurologic  Neurologic:  _0  None   Clinically Relevant Condition:    Ongoing: _1  Yes _2  No  Psychiatric   Psychiatric:  Clinically Relevant Condition:  Ongoing:  _3  None    _4  Yes _5  No  Endocrine System / Metabolic   Endocrine System/Metabolic:  <[id number]>_8<\/KDXIPJASNKNLZJQB>_3  None   Clinically Relevant Condition: Morbid obesity   Ongoing: _7  Yes _8  No  Immunologic (including allergies)   Immunologic:  _9  None   Clinically Relevant Condition:   Ongoing: _10  Yes _11  No  Lymphatic / Hematological   Lymphatic/Hematological  _12  None   Clinically Relevant Condition: Chronic anticoagulation   Ongoing:  _13 Yes  _14 No  Other Body System   Other:   _15  None  Clinically Relevant Condition:   Ongoing: _16  Yes _17  No     Add additional forms for Other findings or multiple findings for a specific body system listed above.     ECG  Was ECG Performed? _18  Yes (Complete Below) _19  No (Complete Protocol Deviation)    Was the assessment collected on the same date as the Visit Date? _20  Yes _21  No     If No - Date of ECG (dd/MMM/yyyy):       Normal Sinus Rhythm?                     Atrial Fibrillation?                    Sinus Rhythm with Extrasystoles?   _22  Yes _23  No  _24  Yes _25  No  _26  Yes _27  No                     Other:  ventricular paced           GDMT For each drug class in the table below, check the boxes that are found in each of the columns to document GDMT optimization, or to provide the reason for any variation from the GDMT guidelines that apply to your practice.  ACE or ARB or ARNI Drug Class    Is the subject currently  taking medications from this drug class? Yes _28  Place a check next to the drug(s) the subject is taking and enter the dose in mg and the number of doses per day.     Drug Dose (mg) Frequency (per day)    _29  Captopril ______ ______    _30  Enalapril ______ ______    _31  Fosinopril ______ ______    _32  Lisinopril ______ ______    _33  Perindopril ______ ______    _34  Quinapril ______ ______    _35  Ramipril ______ ______    _36  Trandolapril ______ ______    _37  Candesartan ______ ______    _38  Losartan ______ ______    _39  Valsartan ______ ______    _40  Sacubritil Delene Loll) 97 twice    _41  Valsartan (Entresto) 103 twice    _42  Other _____ ______ ______   No _43  Reason for Variation? (check all that apply) _44  Intolerance _45  Contraindication    Reason for Intolerance? (check all that apply) _46  Cough _47  Hypotension _48  Hyperkalemia _49  Renal Impairment _50  Angioedema _51  Other _____    Reason for Contraindication? (check all that apply) _52  Cough _53  Hypotension _54  Hyperkalemia _55  Renal Impairment _56  Angioedema _57  Other _____          Beta-Blocker Drug Class    Is the subject currently taking medications from this drug class? Yes _58  Place a check next to the  drug(s) the subject is taking and enter the dose in mg and the number of doses per day.     Drug Dose (mg) Frequency (per day)    _0  Bisoprolol ______ ______    _1  Carvedilol ______ ______    _2  Carvedilol (CR) ______ ______    _3  Metoprolol (CR/XL) 100 twice    _4  Nebivolol ______ ______    _5  Other _____ ______ ______   No _6  Reason for Variation? (check all that apply) _7  Intolerance _8  Contraindication    Reason for Intolerance? (check all that apply) _9  Fatigue _10  Hypotension _11  Bradycardia _12  Impotence / Lost libido _13  Other _____    Reason for Contraindication? (check all that apply) _14  Fatigue _15  Hypotension _16  Bradycardia _17  Impotence / Lost libido _18  Other _____  Mineralocorticoid antagonist Drug Class    Is the  subject currently taking medications from this drug class? Yes _19  Place a check next to the drug(s) the subject is taking and enter the dose in mg and the number of doses per day.     Drug Dose (mg) Frequency (per day)     _20 Eplerenone ______ ______     _21 Spironolactone 25 daily     _22 Other _____ ______ ______   No _23  Reason for Variation? (check all that apply) _24  Intolerance _25  Contraindication    Reason for Intolerance? (check all that apply) _26  Hypotension _27  Hyperkalemia _28  Renal Impairment _29  Other _____    Reason for Contraindication? (check all that apply) _30  Hypotension _31  Hyperkalemia _32  Renal Impairment _33  Other _____       Pregnancy Test  Urine pregnancy test must be negative to enroll subject into the study (women of child bearing potential only).   Urine Pregnancy Test Performed?  _34  Yes (Complete Below) _35  No (Complete Protocol Deviation) <[id number]>_2<\/WLNLGXQJJHERDEYC>_14  Not Applicable   Was the assessment collected on the same date as the Visit Date?  _37  Yes _38  No   If no - Date of pregnancy test (dd/MMM/yyyy):     Result:  _39  Positive _40  Negative      Biomarkers   Were the blood samples drawn for the biomarkers? _41  Yes (Complete Below) _42  No (Complete Protocol Deviation)   Was the assessment collected on the same date as the Visit Date? _43  Yes _44  No   If no - Date blood samples drawn (dd/MMM/yyyy):      Were all of the biomarker samples shipped to the lab?   _45  Yes (Complete Below) _46  No (Complete Protocol Deviation)   Date blood samples shipped (dd/MMM/yyyy):       Were all the samples viable for analysis:   _47  Yes _48  No (Complete Protocol Deviation)        FIRST 6 Minute Walk 6 Minute Walk  Was the 6 minute walk performed? _49  Yes _50  No (Complete Protocol Deviation)   Date and Time of 6-minute walk (dd/MMM/yyyy [id number] clock):  01/08/21          [id number]      See worksheet for results    Second  6 Minute Walk 6 Minute Walk  Was the 6  minute walk performed? _51  Yes _52  No (Complete Protocol Deviation)   Date and Time of 6-minute walk (dd/MMM/yyyy [id number] clock):  01/08/21          [id number]   See worksheet for results    24 Hour Holter   Was the 24 Hour Holter applied? _53  Yes (Complete Below) _54  No (Complete Protocol Deviation)  Was the assessment collected on the same date as the Visit Date? _0  Yes _1  No   If no - Date 24 Hour Holter applied (dd/MMM/yyyy): 01/14/2021    Was the 24 Hour Holter card uploaded or sent to the core lab?  _2  Yes (Complete Below) _3  No (Complete Protocol Deviation)    Date 24 Hour Holter uploaded or sent to the core lab (dd/MMM/yyyy):  01/15/2021    Was there confirmation from the Holter core lab that the data was interpretable? Please do not erase card until after confirmation the data were received and interpretable   _4  Yes _5  No (Complete Protocol Deviation)        KCCQ  Use subject completed source. Template is also in regulatory binder.  Was the KCCQ administered and completed? _6  Yes (Complete Below) _7  No (Complete Protocol Deviation)   Was the assessment collected on the same date as the Visit Date? _8  Yes _9  No   If no - Date KCCQ administered (dd/MMM/yyyy):     Was the KCCQ  administered In Person (at the site) or At Home (remotely)? _10  In Person _11  At Home       See work sheet in patient binder. Used subject completed source.           EQ-5D Was the EQ-5D collected:   _12  Yes (Complete Below) _13  No (Complete Protocol Deviation)  Was the assessment administered on the same date as the Visit Date? _14  Yes _15  No  Date EQ-5D administered ___ ___/__ __ __/__ __ __ __        DD                  MMM                             YYYY   Was the EQ-5D administered In Person (at the site) or At Home (remotely)? _16  In Person _17  At Home      See work sheet in patient binder. Used subject completed source.     Provisional Implant Date (Date  OR is/has been scheduled)   Provisional Implant Date (dd/MMM/yyyy):    01/28/2021   Date Used for Follow Up Visit Schedule (populated by [employer])           Con Meds/AEs  Con Meds/AEs   Did the subject experience any Concomitant Medication Changes, Adverse Events, Serious Adverse Events, Adverse Device Events, Device Malfunctions, Hospital Admissions, or HF Hospitalization Equivalent visits since time of consent?  _18  Yes (Complete appropriate worksheet and         for  [name] sites, collect billing information)  _19  No       Health Status Question   Were the question asked   _20  Yes  _21  No  Was the assessment collected on the same date as the Visit date?  Date (dd/MMM/yyyy): _22  Yes  _23  No  Individual(s) questioned: check all that apply) _24  Patient _25  Caregiver/Carer _26 Family Members _27  Other individuals accompanying patient  Since you last visit here or the last time that we spoke with one another, have any of the following happened for the first time, or worsened, that we have not talked about previously? (check all that apply)  _28 Coughing or Wheezing _29 Chest pain _30  Difficulty concentrating or decreased alertness _31 fatigue and weakness _32  Sensation of rapid or irregular heartbeat _33  More difficulty getting you chores done or taking care  of your self _0 More shortness of breath  _1  Lightheadness or fainting  _2 Shortness of breath when you are physically active or       when you lie down _3  Swelling in your legs, ankles or feet, fluid retention,      swelling around your stomach _4   Water retention _5  None of the above   Did you see a health care provider for any of these or other urgent reason _6  Yes  _7  No  If yes, Where? _8  Clinic or office _9  Emergency room, _10  Urgent care  _11  Hospital (stayed one or more nights) _12    If yes, what kind of health care provider did you see? _13  Emergency or urgent care  _14  Family doctor, general  practitioner _15 Cardiologist (or heart failure doctor) _16  Nurse  _17  pharmacy  _18  other    Other, specify    Since you last visit here or the last time that we spoke with one another, have you had any Concomitant Medication Changes? _19  Yes ( Modify the appropriate Concomitant Medication form in the casebook) _20  No  Since you last visit here or the last time that we spoke with the subject, has the subject had any Adverse Events or Serious Adverse Events not captured above? _21  Yes (create AE/SAE/Device Malfunction form as        needed) _22  No <[id number]>_7<\/VNRWCHJSCBIPJRPZ>_96  Not applicable  Since you last visit here or the last time that we spoke with one another have there been any other changes in how you ( the patient) have been feeling, or a health problem that we have not talking about?   IF yes, list:

## 2021-01-09 LAB — BASIC METABOLIC PANEL (7)
BUN/Creatinine Ratio: 16 (ref 9–20)
BUN: 18 mg/dL (ref 6–24)
CO2: 22 mmol/L (ref 20–29)
Chloride: 106 mmol/L (ref 96–106)
Creatinine, Ser: 1.14 mg/dL (ref 0.76–1.27)
Glucose: 96 mg/dL (ref 65–99)
Potassium: 5.1 mmol/L (ref 3.5–5.2)
Sodium: 139 mmol/L (ref 134–144)
eGFR: 77 mL/min/{1.73_m2} (ref >59)

## 2021-01-12 ENCOUNTER — Other Ambulatory Visit: Payer: Self-pay

## 2021-01-12 ENCOUNTER — Encounter (HOSPITAL_COMMUNITY)
Admission: RE | Admit: 2021-01-12 | Discharge: 2021-01-12 | Disposition: A | Discharge status: Home / Self Care | Payer: Medicare Other | Source: Ambulatory Visit | Attending: Cardiology | Admitting: Cardiology

## 2021-01-12 VITALS — Wt 309.5 lb

## 2021-01-12 DIAGNOSIS — I5022 Chronic systolic (congestive) heart failure: Secondary | ICD-10-CM

## 2021-01-12 NOTE — Progress Notes (Signed)
Daily Session Note  Patient Details  Name: MANNING LUNA MRN: [id number] Date of Birth: 1966-03-06 Referring Provider:   Flowsheet Row CARDIAC REHAB PHASE II ORIENTATION from 01/05/2021 in [facility]  Referring Provider Dr. Aundra Dubin      Encounter Date: 01/12/2021  Check In:  Session Check In - 01/12/21 0815      Check-In   Supervising physician immediately available to respond to emergencies [facility] MD immediately available    Physician(s) Dr. Harl Bowie    Location [facility]    Staff Present Cathren Harsh, MS, Exercise Physiologist;Dalton Kris Mouton, MS, ACSM-CEP, Exercise Physiologist    Virtual Visit No    Medication changes reported     No    Fall or balance concerns reported    No    Tobacco Cessation No Change    Warm-up and Cool-down Performed as group-led instruction    Resistance Training Performed Yes    VAD Patient? No    PAD/SET Patient? No      Pain Assessment   Currently in Pain? No/denies    Pain Score 0-No pain    Multiple Pain Sites No           Capillary Blood Glucose: No results found for this or any previous visit (from the past 24 hour(s)).    Social History   Tobacco Use  Smoking Status Never Smoker  Smokeless Tobacco Never Used    Goals Met:  Independence with exercise equipment Exercise tolerated well No report of cardiac concerns or symptoms Strength training completed today  Goals Unmet:  Not Applicable  Comments: check out 0915   Dr. Kathie Dike is Medical Director for [facility].

## 2021-01-14 ENCOUNTER — Other Ambulatory Visit: Payer: Self-pay

## 2021-01-14 ENCOUNTER — Encounter (HOSPITAL_COMMUNITY)
Admission: RE | Admit: 2021-01-14 | Discharge: 2021-01-14 | Disposition: A | Discharge status: Home / Self Care | Payer: Medicare Other | Source: Ambulatory Visit | Attending: Cardiology | Admitting: Cardiology

## 2021-01-14 ENCOUNTER — Ambulatory Visit (HOSPITAL_BASED_OUTPATIENT_CLINIC_OR_DEPARTMENT_OTHER)
Admission: RE | Admit: 2021-01-14 | Discharge: 2021-01-14 | Disposition: A | Discharge status: Home / Self Care | Payer: Medicare Other | Source: Ambulatory Visit | Attending: Cardiology | Admitting: Cardiology

## 2021-01-14 ENCOUNTER — Encounter: Payer: Medicare Other | Admitting: *Deleted

## 2021-01-14 ENCOUNTER — Ambulatory Visit (HOSPITAL_COMMUNITY)
Admission: RE | Admit: 2021-01-14 | Discharge: 2021-01-14 | Disposition: A | Discharge status: Home / Self Care | Payer: Medicare Other | Source: Ambulatory Visit | Attending: Cardiology | Admitting: Cardiology

## 2021-01-14 DIAGNOSIS — Z01810 Encounter for preprocedural cardiovascular examination: Secondary | ICD-10-CM | POA: Insufficient documentation

## 2021-01-14 DIAGNOSIS — Z006 Encounter for examination for normal comparison and control in clinical research program: Secondary | ICD-10-CM

## 2021-01-14 DIAGNOSIS — I5022 Chronic systolic (congestive) heart failure: Secondary | ICD-10-CM | POA: Diagnosis not present

## 2021-01-14 DIAGNOSIS — R012 Other cardiac sounds: Secondary | ICD-10-CM | POA: Insufficient documentation

## 2021-01-14 DIAGNOSIS — E785 Hyperlipidemia, unspecified: Secondary | ICD-10-CM | POA: Insufficient documentation

## 2021-01-14 NOTE — Progress Notes (Signed)
  Echocardiogram 2D Echocardiogram has been performed.  Augustine Radar 01/14/2021, 12:21 PM

## 2021-01-14 NOTE — Progress Notes (Signed)
Daily Session Note  Patient Details  Name: Brandon Torres MRN: [id number] Date of Birth: 06-21-1966 Referring Provider:   Flowsheet Row CARDIAC REHAB PHASE II ORIENTATION from 01/05/2021 in [place]  Referring Provider Dr. Aundra Dubin      Encounter Date: 01/14/2021  Check In:  Session Check In - 01/14/21 0815      Check-In   Supervising physician immediately available to respond to emergencies [facility] MD immediately available    Physician(s) Dr. Harl Bowie    Location [facility]    Staff Present Cathren Harsh, MS, Exercise Physiologist;Dalton Kris Mouton, MS, ACSM-CEP, Exercise Physiologist    Virtual Visit No    Medication changes reported     No    Fall or balance concerns reported    No    Tobacco Cessation No Change    Warm-up and Cool-down Performed as group-led instruction    Resistance Training Performed Yes    VAD Patient? No    PAD/SET Patient? No      Pain Assessment   Currently in Pain? No/denies    Pain Score 0-No pain    Multiple Pain Sites No           Capillary Blood Glucose: No results found for this or any previous visit (from the past 24 hour(s)).    Social History   Tobacco Use  Smoking Status Never Smoker  Smokeless Tobacco Never Used    Goals Met:  Independence with exercise equipment Exercise tolerated well No report of cardiac concerns or symptoms Strength training completed today  Goals Unmet:  Not Applicable  Comments: check out 0915   Dr. Kathie Dike is Medical Director for [facility].

## 2021-01-14 NOTE — Research (Signed)
Subject [id number] Enrollment  March 23rd, 2022     24 Hour Holter   Was the 24 Hour Holter applied? _0   Yes (Complete Below) _1   No (Complete Protocol Deviation)   Was the assessment collected on the same date as the Visit Date? _2  Yes _3   No   If no - Date 24 Hour Holter applied (dd/MMM/yyyy): 23/MAR/2022     Was the 24 Hour Holter card uploaded or sent to the core lab?  _4  Yes (Complete Below) _5   No (Complete Protocol Deviation)    Date 24 Hour Holter uploaded or sent to the core lab (dd/MMM/yyyy):      Was there confirmation from the Holter core lab that the data was interpretable? Please do not erase card until after confirmation the data were received and interpretable   _6  Yes _7  No (Complete Protocol Deviation)     Subject came into research clinic today after their echocardiogram was complete to have their Holter Monitor applied for the [facility]. Subject's Holter Monitor was applied and started recording at 12:33:05 on 01/14/2021. Subject also met with Dr. Trula Slade to go over surgery details if subject is able to randomize and possibly gets randomized for the device.

## 2021-01-14 NOTE — Progress Notes (Signed)
Right Carotid US completed     Please see CV Proc for preliminary results.   Clint Guy, RVT

## 2021-01-16 ENCOUNTER — Other Ambulatory Visit: Payer: Self-pay

## 2021-01-16 ENCOUNTER — Encounter (HOSPITAL_COMMUNITY)
Admission: RE | Admit: 2021-01-16 | Discharge: 2021-01-16 | Disposition: A | Discharge status: Home / Self Care | Payer: Medicare Other | Source: Ambulatory Visit | Attending: Cardiology | Admitting: Cardiology

## 2021-01-16 DIAGNOSIS — I5022 Chronic systolic (congestive) heart failure: Secondary | ICD-10-CM

## 2021-01-16 NOTE — Progress Notes (Signed)
Daily Session Note  Patient Details  Name: CLAUDIUS MICH MRN: [id number] Date of Birth: 14-Dec-1965 Referring Provider:   Flowsheet Row CARDIAC REHAB PHASE II ORIENTATION from 01/05/2021 in [place]  Referring Provider Dr. Aundra Dubin      Encounter Date: 01/16/2021  Check In:  Session Check In - 01/16/21 0815      Check-In   Supervising physician immediately available to respond to emergencies [facility] MD immediately available    Physician(s) Dr. Harrington Challenger    Location [facility]    Staff Present Cathren Harsh, MS, Exercise Physiologist;Dalton Kris Mouton, MS, ACSM-CEP, Exercise Physiologist    Virtual Visit No    Medication changes reported     No    Fall or balance concerns reported    No    Tobacco Cessation No Change    Warm-up and Cool-down Performed as group-led instruction    Resistance Training Performed Yes    VAD Patient? No    PAD/SET Patient? No      Pain Assessment   Currently in Pain? No/denies    Pain Score 0-No pain    Multiple Pain Sites No           Capillary Blood Glucose: No results found for this or any previous visit (from the past 24 hour(s)).    Social History   Tobacco Use  Smoking Status Never Smoker  Smokeless Tobacco Never Used    Goals Met:  Independence with exercise equipment Exercise tolerated well Queuing for purse lip breathing No report of cardiac concerns or symptoms  Goals Unmet:  Not Applicable  Comments: check out 0915   Dr. Kathie Dike is Medical Director for [facility] Pul[facility] Re[facility].

## 2021-01-19 ENCOUNTER — Other Ambulatory Visit: Payer: Self-pay

## 2021-01-19 ENCOUNTER — Encounter (HOSPITAL_COMMUNITY): Payer: Medicare Other

## 2021-01-19 ENCOUNTER — Encounter (HOSPITAL_COMMUNITY)
Admission: RE | Admit: 2021-01-19 | Discharge: 2021-01-19 | Disposition: A | Discharge status: Home / Self Care | Payer: Medicare Other | Source: Ambulatory Visit | Attending: Cardiology | Admitting: Cardiology

## 2021-01-19 ENCOUNTER — Other Ambulatory Visit (HOSPITAL_COMMUNITY): Payer: Medicare Other

## 2021-01-19 DIAGNOSIS — I5022 Chronic systolic (congestive) heart failure: Secondary | ICD-10-CM | POA: Diagnosis not present

## 2021-01-19 NOTE — Progress Notes (Signed)
Daily Session Note  Patient Details  Name: Brandon Torres MRN: [id number] Date of Birth: 03-13-1966 Referring Provider:   Flowsheet Row CARDIAC REHAB PHASE II ORIENTATION from 01/05/2021 in [place]  Referring Provider Dr. Aundra Dubin      Encounter Date: 01/19/2021  Check In:  Session Check In - 01/19/21 0815      Check-In   Supervising physician immediately available to respond to emergencies [facility] immediately available    Physician(s) Dr. Domenic Polite    Location [facility]    Staff Present Cathren Harsh, MS, Exercise Physiologist;Dalton Kris Mouton, MS, ACSM-CEP, Exercise Physiologist    Virtual Visit No    Medication changes reported     No    Fall or balance concerns reported    No    Tobacco Cessation No Change    Warm-up and Cool-down Performed as group-led instruction    Resistance Training Performed Yes    VAD Patient? No    PAD/SET Patient? No      Pain Assessment   Currently in Pain? No/denies    Pain Score 0-No pain    Multiple Pain Sites No           Capillary Blood Glucose: No results found for this or any previous visit (from the past 24 hour(s)).    Social History   Tobacco Use  Smoking Status Never Smoker  Smokeless Tobacco Never Used    Goals Met:  Independence with exercise equipment Exercise tolerated well No report of cardiac concerns or symptoms Strength training completed today  Goals Unmet:  Not Applicable  Comments: check out 0915   Dr. Kathie Dike is Medical Director for [facility] Pulmonary Rehab.

## 2021-01-19 NOTE — Progress Notes (Signed)
I have reviewed a Home Exercise Prescription with Brandon Torres . Brandon Torres is  currently exercising at home.  The patient was advised to walk 3 days a week for 30-40 minutes per day.  Brandon Torres and I discussed how to progress their exercise prescription.  The patient stated that their goals were to get stronger.  The patient stated that they understand the exercise prescription.  We reviewed exercise guidelines, target heart rate during exercise, RPE Scale, weather conditions, NTG use, endpoints for exercise, warmup and cool down.  Patient is encouraged to come to me with any questions. I will continue to follow up with the patient to assist them with progression and safety.

## 2021-01-20 LAB — ECHOCARDIOGRAM COMPLETE
Calc EF: 40.5 %
MV M vel: 3.67 m/s
MV Peak grad: 53.9 mmHg
S' Lateral: 4.9 cm
Single Plane A2C EF: 33.8 %
Single Plane A4C EF: 49.6 %

## 2021-01-21 ENCOUNTER — Other Ambulatory Visit: Payer: Self-pay

## 2021-01-21 ENCOUNTER — Encounter (HOSPITAL_COMMUNITY)
Admission: RE | Admit: 2021-01-21 | Discharge: 2021-01-21 | Disposition: A | Discharge status: Home / Self Care | Payer: Medicare Other | Source: Ambulatory Visit | Attending: Cardiology | Admitting: Cardiology

## 2021-01-21 DIAGNOSIS — I5022 Chronic systolic (congestive) heart failure: Secondary | ICD-10-CM | POA: Diagnosis not present

## 2021-01-21 NOTE — Progress Notes (Signed)
Daily Session Note  Patient Details  Name: NHAN QUALLEY MRN: [id number] Date of Birth: [birth year] Referring Provider:   Flowsheet Row CARDIAC REHAB PHASE II ORIENTATION from 01/05/2021 in [place]  Referring Provider Dr. Aundra Dubin      Encounter Date: 01/21/2021  Check In:  Session Check In - 01/21/21 0815      Check-In   Supervising physician immediately available to respond to emergencies [facility] immediately available    Physician(s) Dr. Domenic Polite    Location [facility]    Staff Present Cathren Harsh, MS, Exercise Physiologist;Dalton Kris Mouton, MS, ACSM-CEP, Exercise Physiologist    Virtual Visit No    Medication changes reported     No    Fall or balance concerns reported    No    Tobacco Cessation No Change    Warm-up and Cool-down Performed as group-led instruction    Resistance Training Performed Yes    VAD Patient? No    PAD/SET Patient? No      Pain Assessment   Currently in Pain? No/denies    Pain Score 0-No pain    Multiple Pain Sites No           Capillary Blood Glucose: No results found for this or any previous visit (from the past 24 hour(s)).    Social History   Tobacco Use  Smoking Status Never Smoker  Smokeless Tobacco Never Used    Goals Met:  Independence with exercise equipment Exercise tolerated well No report of cardiac concerns or symptoms Strength training completed today  Goals Unmet:  Not Applicable  Comments: check out 0915   Dr. Kathie Dike is Medical Director for [facility] Pulmonary Re[facility].

## 2021-01-22 DIAGNOSIS — Z006 Encounter for examination for normal comparison and control in clinical research program: Secondary | ICD-10-CM

## 2021-01-22 NOTE — Addendum Note (Signed)
Addended by: Orvilla Cornwall on: 01/22/2021 01:59 PM   Modules accepted: Orders

## 2021-01-22 NOTE — Research (Signed)
                                                                                                                Randomization # [id number]   Visit: Enrollment Form: Site Contact Before Randomization    Visit Date (dd/MMM/yyyy):   01/22/21    Contact Subject on day of or 1 day PRIOR to randomization and confirm the following: 1) Has the study team contacted the patient within the last 24 hours? Yes [x]  No []   2) Has the health status of the patient changed since screening was completed? Yes []   (complete AE form; randomize after AE has resolved)     No [x]   3) Did the study team remind the patient what randomization means? Yes [x]   No []   4) Is the patient willing to accept the outcome of randomization? Yes [x]   No []   5) Have the Primary Investigator and the Surgeon examined the patient, and do and both agree that randomization can proceed? Yes [x]   No []                    Do not randomize without the above confirmations!    Randomization   Randomization must occur less than or equal to 30 days from consent   Randomization    Date and Time of Randomization (dd/MMM/yyyy HH:mm 24hr clock):    31/MAR/2022 12:26:57         Treatment Assignment: (attach/file treatment assignment email from [employer] to source)  Control

## 2021-01-23 ENCOUNTER — Encounter (HOSPITAL_COMMUNITY)
Admission: RE | Admit: 2021-01-23 | Discharge: 2021-01-23 | Disposition: A | Discharge status: Home / Self Care | Payer: Medicare Other | Source: Ambulatory Visit | Attending: Cardiology | Admitting: Cardiology

## 2021-01-23 ENCOUNTER — Other Ambulatory Visit: Payer: Self-pay

## 2021-01-23 ENCOUNTER — Other Ambulatory Visit (HOSPITAL_COMMUNITY): Payer: Self-pay | Admitting: Cardiology

## 2021-01-23 DIAGNOSIS — I5022 Chronic systolic (congestive) heart failure: Secondary | ICD-10-CM | POA: Insufficient documentation

## 2021-01-23 NOTE — Progress Notes (Signed)
Daily Session Note  Patient Details  Name: Brandon Torres MRN: [id number] Date of Birth: 06/25/1966 Referring Provider:   Flowsheet Row CARDIAC REHAB PHASE II ORIENTATION from 01/05/2021 in [place] C[facility]AC [facility]  Referring Provider Dr. McLean      Encounter Date: 01/23/2021  Check In:  Session Check In - 01/23/21 0815      Check-In   Supervising physician immediately available to respond to emergencies [facility] MD immediately available    Physician(s) Dr. Branch    Location [facility]    Staff Present Madison Karch, MS, Exercise Physiologist;Dalton Fletcher, MS, ACSM-CEP, Exercise Physiologist    Virtual Visit No    Medication changes reported     No    Fall or balance concerns reported    No    Tobacco Cessation No Change    Warm-up and Cool-down Performed as group-led instruction    Resistance Training Performed Yes    VAD Patient? No    PAD/SET Patient? No      Pain Assessment   Currently in Pain? No/denies    Pain Score 0-No pain    Multiple Pain Sites No           Capillary Blood Glucose: No results found for this or any previous visit (from the past 24 hour(s)).    Social History   Tobacco Use  Smoking Status Never Smoker  Smokeless Tobacco Never Used    Goals Met:  Independence with exercise equipment Exercise tolerated well No report of cardiac concerns or symptoms Strength training completed today  Goals Unmet:  Not Applicable  Comments: check out 0915   Dr. Jehanzeb Memon is Medical Director for [facility]. 

## 2021-01-26 ENCOUNTER — Encounter (HOSPITAL_COMMUNITY)
Admission: RE | Admit: 2021-01-26 | Discharge: 2021-01-26 | Disposition: A | Discharge status: Home / Self Care | Payer: Medicare Other | Source: Ambulatory Visit | Attending: Cardiology | Admitting: Cardiology

## 2021-01-26 ENCOUNTER — Other Ambulatory Visit: Payer: Self-pay

## 2021-01-26 VITALS — Wt 312.8 lb

## 2021-01-26 DIAGNOSIS — I5022 Chronic systolic (congestive) heart failure: Secondary | ICD-10-CM | POA: Diagnosis not present

## 2021-01-26 NOTE — Progress Notes (Signed)
Daily Session Note  Patient Details  Name: Brandon Torres MRN: [id number] Date of Birth: May 05, 1966 Referring Provider:   Flowsheet Row CARDIAC REHAB PHASE II ORIENTATION from 01/05/2021 in [place]  Referring Provider Dr. Aundra Dubin      Encounter Date: 01/26/2021  Check In:  Session Check In - 01/26/21 0815      Check-In   Supervising physician immediately available to respond to emergencies [facility] MD immediately available    Physician(s) Dr. Harl Bowie    Location [facility]    Staff Present Cathren Harsh, MS, Exercise Physiologist;Dalton Kris Mouton, MS, ACSM-CEP, Exercise Physiologist    Virtual Visit No    Medication changes reported     No    Fall or balance concerns reported    No    Tobacco Cessation No Change    Warm-up and Cool-down Performed as group-led instruction    Resistance Training Performed Yes    VAD Patient? No    PAD/SET Patient? No      Pain Assessment   Currently in Pain? No/denies    Pain Score 0-No pain    Multiple Pain Sites No           Capillary Blood Glucose: No results found for this or any previous visit (from the past 24 hour(s)).    Social History   Tobacco Use  Smoking Status Never Smoker  Smokeless Tobacco Never Used    Goals Met:  Independence with exercise equipment Exercise tolerated well No report of cardiac concerns or symptoms Strength training completed today  Goals Unmet:  Not Applicable  Comments: checkout 0915   Dr. Kathie Dike is Medical Director for [facility] Pul[facility] Re[facility].

## 2021-01-28 ENCOUNTER — Other Ambulatory Visit: Payer: Self-pay

## 2021-01-28 ENCOUNTER — Encounter (HOSPITAL_COMMUNITY)
Admission: RE | Admit: 2021-01-28 | Discharge: 2021-01-28 | Disposition: A | Discharge status: Home / Self Care | Payer: Medicare Other | Source: Ambulatory Visit | Attending: Cardiology | Admitting: Cardiology

## 2021-01-28 DIAGNOSIS — I5022 Chronic systolic (congestive) heart failure: Secondary | ICD-10-CM

## 2021-01-28 NOTE — Progress Notes (Signed)
Cardiac Individual Treatment Plan  Patient Details  Name: Brandon Torres: [id number] Date of Birth: 16-Mar-1966 Referring Provider:   Flowsheet Row CARDIAC REHAB PHASE II ORIENTATION from 01/05/2021 in [facility] CARDIAC REHABILITATION  Referring Provider Dr. Shirlee Latch      Initial Encounter Date:  Flowsheet Row CARDIAC REHAB PHASE II ORIENTATION from 01/05/2021 in [place] CARDIAC REHABILITATION  Date 01/05/21      Visit Diagnosis: Chronic systolic congestive heart failure (HCC)  Patient's Home Medications on Admission:  Current Outpatient Medications:  .  acetaminophen (TYLENOL) 500 MG tablet, Take 1,000 mg by mouth daily as needed for mild pain or headache. , Disp: , Rfl:  .  ENTRESTO 97-103 MG, TAKE ONE TABLET BY MOUTH TWICE DAILY., Disp: 60 tablet, Rfl: 6 .  furosemide (LASIX) 40 MG tablet, Take 1 tablet (40 mg total) by mouth every morning AND 0.5 tablets (20 mg total) every evening., Disp: 135 tablet, Rfl: 3 .  metoprolol succinate (TOPROL-XL) 100 MG 24 hr tablet, TAKE (1) TABLET BY MOUTH TWICE DAILY. TAKE WITH OR IMMEDIATELY FOLLOWING A MEAL., Disp: 60 tablet, Rfl: 6 .  spironolactone (ALDACTONE) 25 MG tablet, Take 1 tablet (25 mg total) by mouth daily., Disp: 90 tablet, Rfl: 3 .  XARELTO 20 MG TABS tablet, TAKE ONE TABLET BY MOUTH EVERY DAY WITH DINNER., Disp: 30 tablet, Rfl: 6  Past Medical History: Past Medical History:  Diagnosis Date  . AICD (automatic cardioverter/defibrillator) present   . CHF (congestive heart failure) (HCC)   . Chronic anticoagulation   . Dysrhythmia    AFib  . Morbid obesity (HCC)   . Non-ischemic cardiomyopathy (HCC)   . OSA on CPAP    "have mask; can't tolerate mask" (04/23/2015)  . PAF (paroxysmal atrial fibrillation) (HCC)    RFA at [facility]+ dofetilide  . PONV (postoperative nausea and vomiting)    nausea  . Ventricular tachycardia (HCC)   . Wears glasses     Tobacco Use: Social History   Tobacco Use  Smoking Status Never Smoker   Smokeless Tobacco Never Used    Labs: Recent Review Flowsheet Data    Labs for ITP Cardiac and Pulmonary Rehab Latest Ref Rng & Units 01/04/2012 11/12/2014 11/12/2014 10/06/2015 03/29/2018   Cholestrol 0 - 200 192 - - 193 174   LDLCALC - 133(H) - - 129(H) 114   HDL >40 mg/dL 46 - - 48 -   Trlycerides 40 - 160 66 - - 81 82   Hemoglobin A1c 4.8 - 5.6 % - - - 5.4 -   PHART 7.350 - 7.450 - - 7.361 - -   PCO2ART 35.0 - 45.0 mmHg - - 40.6 - -   HCO3 20.0 - 24.0 mEq/L - 23.6 23.0 - -   TCO2 0 - 100 mmol/L - 25 24 - -   ACIDBASEDEF 0.0 - 2.0 mmol/L - 2.0 2.0 - -   O2SAT % - 69.0 98.0 - -      Capillary Blood Glucose: No results found for: GLUCAP   Exercise Target Goals: Exercise Program Goal: Individual exercise prescription set using results from initial 6 min walk test and THRR while considering  patient's activity barriers and safety.   Exercise Prescription Goal: Starting with aerobic activity 30 plus minutes a day, 3 days per week for initial exercise prescription. Provide home exercise prescription and guidelines that participant acknowledges understanding prior to discharge.  Activity Barriers & Risk Stratification:   6 Minute Walk:  6 Minute Walk  Row Name 01/05/21 1355         6 Minute Walk   Phase Initial     Distance 1400 feet     Walk Time 6 minutes     # of Rest Breaks 0     MPH 2.7     METS 3.07     RPE 12     VO2 Peak 10.74     Symptoms No     Resting HR 72 bpm     Resting BP 90/70     Resting Oxygen Saturation  96 %     Exercise Oxygen Saturation  during 6 min walk 94 %     Max Ex. HR 116 bpm     Max Ex. BP 110/72     2 Minute Post BP 86/70            Oxygen Initial Assessment:   Oxygen Re-Evaluation:   Oxygen Discharge (Final Oxygen Re-Evaluation):   Initial Exercise Prescription:  Initial Exercise Prescription - 01/05/21 1300      Date of Initial Exercise RX and Referring Provider   Date 01/05/21    Referring Provider Dr. Shirlee Latch     Expected Discharge Date 04/01/21      Treadmill   MPH 1.5    Grade 0    Minutes 22      NuStep   Level 1    SPM 80    Minutes 17      Prescription Details   Frequency (times per week) 3    Duration Progress to 30 minutes of continuous aerobic without signs/symptoms of physical distress      Intensity   THRR 40-80% of Max Heartrate 67-134    Ratings of Perceived Exertion 11-13      Progression   Progression Continue to progress workloads to maintain intensity without signs/symptoms of physical distress.      Resistance Training   Training Prescription Yes    Weight 4    Reps 10-15           Perform Capillary Blood Glucose checks as needed.  Exercise Prescription Changes:   Exercise Prescription Changes    Row Name 01/12/21 0900 01/19/21 0900 01/26/21 1000         Response to Exercise   Blood Pressure (Admit) 100/72 -- 92/64     Blood Pressure (Exercise) 104/74 -- 106/66     Blood Pressure (Exit) 92/70 -- 84/60     Heart Rate (Admit) 89 bpm -- 90 bpm     Heart Rate (Exercise) 117 bpm -- 119 bpm     Heart Rate (Exit) 90 bpm -- 99 bpm     Rating of Perceived Exertion (Exercise) 13 -- 12     Duration Continue with 30 min of aerobic exercise without signs/symptoms of physical distress. -- Continue with 30 min of aerobic exercise without signs/symptoms of physical distress.     Intensity THRR unchanged -- THRR unchanged           Progression   Progression Continue to progress workloads to maintain intensity without signs/symptoms of physical distress. -- Continue to progress workloads to maintain intensity without signs/symptoms of physical distress.           Resistance Training   Training Prescription Yes -- Yes     Weight 5 -- 5     Reps 10-15 -- 10-15     Time 10 Minutes -- 10 Minutes  Treadmill   MPH 1.8 -- 2.2     Grade 0 -- 0     Minutes 22 -- 22     METs 2.38 -- 2.68           NuStep   Level 1 -- 2     SPM 78 -- 102     Minutes 17 --  17     METs 1.9 -- 2.1           Home Exercise Plan   Plans to continue exercise at -- Home (comment) --     Frequency -- Add 3 additional days to program exercise sessions. --     Initial Home Exercises Provided -- 01/19/21 --            Exercise Comments:   Exercise Comments    Row Name 01/12/21 1610 01/19/21 0909         Exercise Comments Patient completed first exercise session today. He tolerated exercise well with no complaints. He looks forward to coming back to rehab. Reviewed home exercise program with patient today. He is currently walking at home 3 days per week for 30 minutes per day. He will continue to walk at home and we will continue to progress home exercise program.             Exercise Goals and Review:   Exercise Goals    Row Name 01/05/21 1358 01/26/21 1035           Exercise Goals   Increase Physical Activity Yes Yes      Intervention Develop an individualized exercise prescription for aerobic and resistive training based on initial evaluation findings, risk stratification, comorbidities and participant's personal goals.;Provide advice, education, support and counseling about physical activity/exercise needs. Develop an individualized exercise prescription for aerobic and resistive training based on initial evaluation findings, risk stratification, comorbidities and participant's personal goals.;Provide advice, education, support and counseling about physical activity/exercise needs.      Expected Outcomes Short Term: Attend rehab on a regular basis to increase amount of physical activity.;Long Term: Add in home exercise to make exercise part of routine and to increase amount of physical activity.;Long Term: Exercising regularly at least 3-5 days a week. Short Term: Attend rehab on a regular basis to increase amount of physical activity.;Long Term: Add in home exercise to make exercise part of routine and to increase amount of physical activity.;Long Term:  Exercising regularly at least 3-5 days a week.      Increase Strength and Stamina Yes Yes      Intervention Provide advice, education, support and counseling about physical activity/exercise needs.;Develop an individualized exercise prescription for aerobic and resistive training based on initial evaluation findings, risk stratification, comorbidities and participant's personal goals. Provide advice, education, support and counseling about physical activity/exercise needs.;Develop an individualized exercise prescription for aerobic and resistive training based on initial evaluation findings, risk stratification, comorbidities and participant's personal goals.      Expected Outcomes Short Term: Increase workloads from initial exercise prescription for resistance, speed, and METs.;Short Term: Perform resistance training exercises routinely during rehab and add in resistance training at home;Long Term: Improve cardiorespiratory fitness, muscular endurance and strength as measured by increased METs and functional capacity ( ) Short Term: Increase workloads from initial exercise prescription for resistance, speed, and METs.;Short Term: Perform resistance training exercises routinely during rehab and add in resistance training at home;Long Term: Improve cardiorespiratory fitness, muscular endurance and strength as measured by increased METs and functional capacity ( )  Able to understand and use rate of perceived exertion (RPE) scale Yes Yes      Intervention Provide education and explanation on how to use RPE scale Provide education and explanation on how to use RPE scale      Expected Outcomes Short Term: Able to use RPE daily in rehab to express subjective intensity level;Long Term:  Able to use RPE to guide intensity level when exercising independently Short Term: Able to use RPE daily in rehab to express subjective intensity level;Long Term:  Able to use RPE to guide intensity level when exercising  independently      Knowledge and understanding of Target Heart Rate Range (THRR) Yes Yes      Intervention Provide education and explanation of THRR including how the numbers were predicted and where they are located for reference Provide education and explanation of THRR including how the numbers were predicted and where they are located for reference      Expected Outcomes Short Term: Able to state/look up THRR;Long Term: Able to use THRR to govern intensity when exercising independently;Short Term: Able to use daily as guideline for intensity in rehab Short Term: Able to state/look up THRR;Long Term: Able to use THRR to govern intensity when exercising independently;Short Term: Able to use daily as guideline for intensity in rehab      Able to check pulse independently Yes Yes      Intervention Provide education and demonstration on how to check pulse in carotid and radial arteries.;Review the importance of being able to check your own pulse for safety during independent exercise Provide education and demonstration on how to check pulse in carotid and radial arteries.;Review the importance of being able to check your own pulse for safety during independent exercise      Expected Outcomes Short Term: Able to explain why pulse checking is important during independent exercise;Long Term: Able to check pulse independently and accurately Short Term: Able to explain why pulse checking is important during independent exercise;Long Term: Able to check pulse independently and accurately      Understanding of Exercise Prescription Yes Yes      Intervention Provide education, explanation, and written materials on patient's individual exercise prescription Provide education, explanation, and written materials on patient's individual exercise prescription      Expected Outcomes Short Term: Able to explain program exercise prescription;Long Term: Able to explain home exercise prescription to exercise independently  Short Term: Able to explain program exercise prescription;Long Term: Able to explain home exercise prescription to exercise independently             Exercise Goals Re-Evaluation :  Exercise Goals Re-Evaluation    Row Name 01/26/21 1035             Exercise Goal Re-Evaluation   Exercise Goals Review Increase Physical Activity;Increase Strength and Stamina;Able to understand and use rate of perceived exertion (RPE) scale;Knowledge and understanding of Target Heart Rate Range (THRR);Able to check pulse independently;Understanding of Exercise Prescription       Comments Patient has completed 7 exercise sessions. He has tolerated exercise well so far and is increasing intensities already. He is currently exercising at home 3 days per week for 30 minutes per day outside of rehab. He walks at home outside. He is getting stronger and building up his stamina. He has progressed well so far and will continue to progress. He is currently exercising at 2.68 METs on the treadmill. Will continue to monitor and progress as able.  Expected Outcomes Through exercise at rehab and with a home exercise program, patient will achieve their goals.               Discharge Exercise Prescription (Final Exercise Prescription Changes):  Exercise Prescription Changes - 01/26/21 1000      Response to Exercise   Blood Pressure (Admit) 92/64    Blood Pressure (Exercise) 106/66    Blood Pressure (Exit) 84/60    Heart Rate (Admit) 90 bpm    Heart Rate (Exercise) 119 bpm    Heart Rate (Exit) 99 bpm    Rating of Perceived Exertion (Exercise) 12    Duration Continue with 30 min of aerobic exercise without signs/symptoms of physical distress.    Intensity THRR unchanged      Progression   Progression Continue to progress workloads to maintain intensity without signs/symptoms of physical distress.      Resistance Training   Training Prescription Yes    Weight 5    Reps 10-15    Time 10 Minutes       Treadmill   MPH 2.2    Grade 0    Minutes 22    METs 2.68      NuStep   Level 2    SPM 102    Minutes 17    METs 2.1           Nutrition:  Target Goals: Understanding of nutrition guidelines, daily intake of sodium 1500mg , cholesterol 200mg , calories 30% from fat and 7% or less from saturated fats, daily to have 5 or more servings of fruits and vegetables.  Biometrics:  Pre Biometrics - 01/26/21 1038      Pre Biometrics   Weight 141.9 kg    BMI (Calculated) 42.42            Nutrition Therapy Plan and Nutrition Goals:  Nutrition Therapy & Goals - 01/19/21 1515      Personal Nutrition Goals   Comments We provide 2 heart healthy nutritional education sessions with handouts.      Intervention Plan   Intervention Nutrition handout(s) given to patient.           Nutrition Assessments:  Nutrition Assessments - 01/05/21 1402      MEDFICTS Scores   Pre Score 50          MEDIFICTS Score Key:  ?70 Need to make dietary changes   40-70 Heart Healthy Diet  ? 40 Therapeutic Level Cholesterol Diet   Picture Your Plate Scores:  <32 Unhealthy dietary pattern with much room for improvement.  41-50 Dietary pattern unlikely to meet recommendations for good health and room for improvement.  51-60 More healthful dietary pattern, with some room for improvement.   >60 Healthy dietary pattern, although there may be some specific behaviors that could be improved.    Nutrition Goals Re-Evaluation:   Nutrition Goals Discharge (Final Nutrition Goals Re-Evaluation):   Psychosocial: Target Goals: Acknowledge presence or absence of significant depression and/or stress, maximize coping skills, provide positive support system. Participant is able to verbalize types and ability to use techniques and skills needed for reducing stress and depression.  Initial Review & Psychosocial Screening:  Initial Psych Review & Screening - 01/05/21 1408      Initial Review    Current issues with None Identified      Family Dynamics   Good Support System? Yes    Comments Patient names his wife as his support system. He has a very positive outlook regarding  his future and is glad to be doing the program. Will continue to monitor.      Barriers   Psychosocial barriers to participate in program There are no identifiable barriers or psychosocial needs.      Screening Interventions   Interventions Encouraged to exercise    Expected Outcomes Short Term goal: Utilizing psychosocial counselor, staff and physician to assist with identification of specific Stressors or current issues interfering with healing process. Setting desired goal for each stressor or current issue identified.;Long Term Goal: Stressors or current issues are controlled or eliminated.;Short Term goal: Identification and review with participant of any Quality of Life or Depression concerns found by scoring the questionnaire.           Quality of Life Scores:  Quality of Life - 01/05/21 1400      Quality of Life   Select Quality of Life      Quality of Life Scores   Health/Function Pre 20.13 %    Socioeconomic Pre 20.93 %    Psych/Spiritual Pre 21.79 %    Family Pre 22.5 %    GLOBAL Pre 20.94 %          Scores of 19 and below usually indicate a poorer quality of life in these areas.  A difference of  2-3 points is a clinically meaningful difference.  A difference of 2-3 points in the total score of the Quality of Life Index has been associated with significant improvement in overall quality of life, self-image, physical symptoms, and general health in studies assessing change in quality of life.  PHQ-9: Recent Review Flowsheet Data    Depression screen [facility] 2/9 01/05/2021 07/24/2019 09/10/2016 06/17/2016   Decreased Interest 0 0 0 0   Down, Depressed, Hopeless 0 0 0 0   PHQ - 2 Score 0 0 0 0   Altered sleeping 0 - 0 0   Tired, decreased energy 1 - 0 1   Change in appetite 0 - 0 0   Feeling  bad or failure about yourself  0 - 0 0   Trouble concentrating 0 - 0 0   Moving slowly or fidgety/restless 0 - 0 0   Suicidal thoughts 0 - 0 0   PHQ-9 Score 1 - 0 1   Difficult doing work/chores Not difficult at all - - Not difficult at all     Interpretation of Total Score  Total Score Depression Severity:  1-4 = Minimal depression, 5-9 = Mild depression, 10-14 = Moderate depression, 15-19 = Moderately severe depression, 20-27 = Severe depression   Psychosocial Evaluation and Intervention:  Psychosocial Evaluation - 01/05/21 1411      Psychosocial Evaluation & Interventions   Interventions Stress management education;Relaxation education;Encouraged to exercise with the program and follow exercise prescription    Comments Patient has no psychosocial issues identified at his orientation visit. His initial QOL score was 20.94 overall and his PHQ-9 score was 1. Will continue to monitor.    Expected Outcomes Patient will have no psychosocial issues identified at discharge.    Continue Psychosocial Services  No Follow up required           Psychosocial Re-Evaluation:  Psychosocial Re-Evaluation    Row Name 01/19/21 1515             Psychosocial Re-Evaluation   Current issues with None Identified       Comments Patient is new to the program completing 5 sessions. He continues to have no psychosocial barriers identified.  He is hoping to get into the research study with Dr. Shirlee LatchMcLean to insert device to improve his left ventricular function. He has a positive outlook and is very interested in improving his health. Will continue to monitor.       Expected Outcomes Patient will have no psychosocial issues identified throughout the program or at disharge.       Interventions Encouraged to attend Cardiac Rehabilitation for the exercise       Continue Psychosocial Services  No Follow up required              Psychosocial Discharge (Final Psychosocial Re-Evaluation):  Psychosocial  Re-Evaluation - 01/19/21 1515      Psychosocial Re-Evaluation   Current issues with None Identified    Comments Patient is new to the program completing 5 sessions. He continues to have no psychosocial barriers identified. He is hoping to get into the research study with Dr. Shirlee LatchMcLean to insert device to improve his left ventricular function. He has a positive outlook and is very interested in improving his health. Will continue to monitor.    Expected Outcomes Patient will have no psychosocial issues identified throughout the program or at disharge.    Interventions Encouraged to attend Cardiac Rehabilitation for the exercise    Continue Psychosocial Services  No Follow up required           Vocational Rehabilitation: Provide vocational rehab assistance to qualifying candidates.   Vocational Rehab Evaluation & Intervention:  Vocational Rehab - 01/05/21 1405      Vocational Rehab Re-Evaulation   Comments Patient is disabled and does not plan to return to work. He does not need vocational rehab.           Education: Education Goals: Education classes will be provided on a weekly basis, covering required topics. Participant will state understanding/return demonstration of topics presented.  Learning Barriers/Preferences:  Learning Barriers/Preferences - 01/05/21 1402      Learning Barriers/Preferences   Learning Barriers None    Learning Preferences Audio;Written Material;Skilled Demonstration           Education Topics: Hypertension, Hypertension Reduction -Define heart disease and high blood pressure. Discus how high blood pressure affects the body and ways to reduce high blood pressure. Flowsheet Row CARDIAC REHAB PHASE II EXERC[place]  Date 08/25/16  Educator DC  Instruction Review Code (retired) 2- meets goals/outcomes      Exercise and Your Heart -Discuss why it is important to exercise, the FITT principles of  exercise, normal and abnormal responses to exercise, and how to exercise safely. Flowsheet Row CARDIAC REHAB PHASE II EXERCISE from [place]2016 [facility]  Date 09/01/16  Educator Hart [place]iane Coad  Instruction Review Code (retired) 2- meets goals/outcomes      Angina -Discuss definition of angina, causes of angina, treatment of angina, and how to decrease risk of having angina. Flowsheet Row CARDIAC REHAB PHASE II EXERCISE from [place]2016 [place]  Date 09/08/16  Educator Hart Rochesteriane Coad  Instruction Review Code 2- meets goals/outcomes      Cardiac Medications -Review what the following cardiac medications are used for, how they affect the body, and side effects that may occur when taking the medications.  Medications include Aspirin, Beta blockers, calcium channel blockers, ACE Inhibitors, angiotensin receptor blockers, diuretics, digoxin, and antihyperlipidemics.   Congestive Heart Failure -Discuss the definition of CHF, how to live with CHF, the signs and symptoms of CHF, and how  keep track of weight and sodium intake.   Heart Disease and Intimacy -Discus the effect sexual activity has on the heart, how changes occur during intimacy as we age, and safety during sexual activity.   Smoking Cessation / COPD -Discuss different methods to quit smoking, the health benefits of quitting smoking, and the definition of COPD. Flowsheet Row CARDIAC REHAB PHASE II EXERCISE from 09/08/2016 in [place] CARDIAC REHABILITATION  Date 07/07/16  Educator DJ  Instruction Review Code (retired) 2- meets goals/outcomes      Nutrition I: Fats -Discuss the types of cholesterol, what cholesterol does to the heart, and how cholesterol levels can be controlled. Flowsheet Row CARDIAC REHAB PHASE II EXERCISE from 09/08/2016 in [place] CARDIAC REHABILITATION  Date 07/14/16  Educator Hart Rochester  Instruction Review Code 2- meets goals/outcomes       Nutrition II: Labels -Discuss the different components of food labels and how to read food label Flowsheet Row CARDIAC REHAB PHASE II EXERCISE from 09/08/2016 in [place] CARDIAC REH[facility]ILITATION  Date 07/21/16  Educator Hart Rochester  Instruction Review Code 2- meets goals/outcomes      Heart Parts/Heart Disease and PAD -Discuss the anatomy of the heart, the pathway of blood circulation through the heart, and these are affected by heart disease. Flowsheet Row CARDIAC REHAB PHASE II EXERCISE from 09/08/2016 in [place] CARDIAC REHABILITATION  Date 07/28/16  Educator DC  Instruction Review Code 2- meets goals/outcomes      Stress I: Signs and Symptoms -Discuss the causes of stress, how stress may lead to anxiety and depression, and ways to limit stress. Flowsheet Row CARDIAC REHAB PHASE II EXERCISE from 09/08/2016 in [place] CARDIAC REH[facility]ITATION  Date 08/04/16  Educator Hart Rochester  Instruction Review Code 2- meets goals/outcomes      Stress II: Relaxation -Discuss different types of relaxation techniques to limit stress. Flowsheet Row CARDIAC REHAB PHASE II EXERCISE from 01/21/2021 in [place] CARDIAC REHABILITATION  Date 01/14/21  Educator mk  Instruction Review Code 2- Demonstrated Understanding      Warning Signs of Stroke / TIA -Discuss definition of a stroke, what the signs and symptoms are of a stroke, and how to identify when someone is having stroke. Flowsheet Row CARDIAC REHAB PHASE II EXERCISE from 01/21/2021 in [place] CARDIAC REHABILITATION  Date 01/21/21  Educator mk  Instruction Review Code 2- Demonstrated Understanding      Knowledge Questionnaire Score:  Knowledge Questionnaire Score - 01/05/21 1404      Knowledge Questionnaire Score   Pre Score 22/24           Core Components/Risk Factors/Patient Goals at Admission:  Personal Goals and Risk Factors at Admission - 01/05/21 1405      Core Components/Risk Factors/Patient Goals on  Admission    Weight Management Obesity;Weight Loss;Yes    Admit Weight 311 lb (141.1 kg)    Goal Weight: Short Term 306 lb (138.8 kg)    Goal Weight: Long Term 301 lb (136.5 kg)    Heart Failure Yes    Intervention Provide a combined exercise and nutrition program that is supplemented with education, support and counseling about heart failure. Directed toward relieving symptoms such as shortness of breath, decreased exercise tolerance, and extremity edema.    Expected Outcomes Improve functional capacity of life;Short term: Attendance in program 2-3 days a week with increased exercise capacity. Reported lower sodium intake. Reported increased fruit and vegetable intake. Reports medication compliance.;Short term: Daily weights obtained and reported for increase.  Utilizing diuretic protocols set by physician.;Long term: Adoption of self-care skills and reduction of barriers for early signs and symptoms recognition and intervention leading to self-care maintenance.    Personal Goal Other Yes    Personal Goal Lose 10 to 15 lbs in the program and 60 to 70 lbs long term.    Intervention Provide cardiac rehab program supplemented with exercise and lifestyle changes at home.    Expected Outcomes Patient will complete the program meeting both personal and program goals.           Core Components/Risk Factors/Patient Goals Review:   Goals and Risk Factor Review    Row Name 01/19/21 1529             Core Components/Risk Factors/Patient Goals Review   Personal Goals Review Weight Management/Obesity       Review Patient is new to the program completing 5 sessions gaining 1 lb since his intial visit. He was referred to CR with chronic Systolic CHF. His personal goals for the program are to lose 10 to 15 lbs in the program and more weight longterm. He is trying to get into a research study with Dr. Shirlee Latch to insert a deivce to stimulate his vagel nerve to improve left ventricular function. He had an  echocardiogarm and carotid artery study completed 3/23 for the study. Echo results have not been released yet and doppler study was normal. He will find out this week if he will be chosen for the study. We will continue to monitor his progress as he works toward meeting these goals.       Expected Outcomes Patient will complete the program meeting both personal and program goals.              Core Components/Risk Factors/Patient Goals at Discharge (Final Review):   Goals and Risk Factor Review - 01/19/21 1529      Core Components/Risk Factors/Patient Goals Review   Personal Goals Review Weight Management/Obesity    Review Patient is new to the program completing 5 sessions gaining 1 lb since his intial visit. He was referred to CR with chronic Systolic CHF. His personal goals for the program are to lose 10 to 15 lbs in the program and more weight longterm. He is trying to get into a research study with Dr. Shirlee Latch to insert a deivce to stimulate his vagel nerve to improve left ventricular function. He had an echocardiogarm and carotid artery study completed 3/23 for the study. Echo results have not been released yet and doppler study was normal. He will find out this week if he will be chosen for the study. We will continue to monitor his progress as he works toward meeting these goals.    Expected Outcomes Patient will complete the program meeting both personal and program goals.           ITP Comments:   Comments: ITP REVIEW Pt is making expected progress toward Cardiac Rehab goals after completing 8 sessions. Recommend continued exercise, life style modification, education, and increased stamina and strength.

## 2021-01-28 NOTE — Progress Notes (Signed)
Daily Session Note  Patient Details  Name: Brandon Torres MRN: [id number] Date of Birth: 01-13-1966 Referring Provider:   Flowsheet Row CARDIAC REHAB PHASE II ORIENTATION from 01/05/2021 in [place]  Referring Provider Dr. Aundra Dubin      Encounter Date: 01/28/2021  Check In:  Session Check In - 01/28/21 0815      Check-In   Supervising physician immediately available to respond to emergencies [facility] MD immediately available    Physician(s) Dr. Harl Bowie    Location [facility]    Staff Present Cathren Harsh, MS, Exercise Physiologist;Dalton Kris Mouton, MS, ACSM-CEP, Exercise Physiologist    Virtual Visit No    Medication changes reported     No    Fall or balance concerns reported    No    Tobacco Cessation No Change    Warm-up and Cool-down Performed as group-led instruction    Resistance Training Performed Yes    VAD Patient? No    PAD/SET Patient? No      Pain Assessment   Currently in Pain? No/denies    Pain Score 0-No pain    Multiple Pain Sites No           Capillary Blood Glucose: No results found for this or any previous visit (from the past 24 hour(s)).    Social History   Tobacco Use  Smoking Status Never Smoker  Smokeless Tobacco Never Used    Goals Met:  Independence with exercise equipment Exercise tolerated well No report of cardiac concerns or symptoms Strength training completed today  Goals Unmet:  Not Applicable  Comments: check out 0915   Dr. Kathie Dike is Medical Director for [facility] Pulmonary Rehab.

## 2021-01-30 ENCOUNTER — Encounter (HOSPITAL_COMMUNITY): Payer: Medicare Other

## 2021-02-02 ENCOUNTER — Encounter (HOSPITAL_COMMUNITY)
Admission: RE | Admit: 2021-02-02 | Discharge: 2021-02-02 | Disposition: A | Discharge status: Home / Self Care | Payer: Medicare Other | Source: Ambulatory Visit | Attending: Cardiology | Admitting: Cardiology

## 2021-02-02 ENCOUNTER — Other Ambulatory Visit: Payer: Self-pay

## 2021-02-02 DIAGNOSIS — I5022 Chronic systolic (congestive) heart failure: Secondary | ICD-10-CM | POA: Diagnosis not present

## 2021-02-02 NOTE — Progress Notes (Signed)
Daily Session Note  Patient Details  Name: HAARIS METALLO MRN: [id number] Date of Birth: 12-17-1965 Referring Provider:   Flowsheet Row CARDIAC REHAB PHASE II ORIENTATION from 01/05/2021 in [place]  Referring Provider Dr. Aundra Dubin      Encounter Date: 02/02/2021  Check In:  Session Check In - 02/02/21 0815      Check-In   Supervising physician immediately available to respond to emergencies [facility] MD immediately available    Physician(s) Dr. Melene Muller    Location [facility]    Staff Present Cathren Harsh, MS, Exercise Physiologist;Dalton Kris Mouton, MS, ACSM-CEP, Exercise Physiologist    Virtual Visit No    Medication changes reported     No    Fall or balance concerns reported    No    Tobacco Cessation No Change    Warm-up and Cool-down Performed as group-led instruction    Resistance Training Performed Yes    VAD Patient? No    PAD/SET Patient? No      Pain Assessment   Currently in Pain? No/denies    Pain Score 0-No pain    Multiple Pain Sites No           Capillary Blood Glucose: No results found for this or any previous visit (from the past 24 hour(s)).    Social History   Tobacco Use  Smoking Status Never Smoker  Smokeless Tobacco Never Used    Goals Met:  Independence with exercise equipment Exercise tolerated well No report of cardiac concerns or symptoms Strength training completed today  Goals Unmet:  Not Applicable  Comments: check out 0915    Dr. Kathie Dike is Medical Director for [facility] Pulmonary Rehab.

## 2021-02-03 ENCOUNTER — Ambulatory Visit (INDEPENDENT_AMBULATORY_CARE_PROVIDER_SITE_OTHER): Payer: Medicare Other

## 2021-02-03 DIAGNOSIS — I5022 Chronic systolic (congestive) heart failure: Secondary | ICD-10-CM

## 2021-02-03 DIAGNOSIS — I428 Other cardiomyopathies: Secondary | ICD-10-CM | POA: Diagnosis not present

## 2021-02-03 DIAGNOSIS — I429 Cardiomyopathy, unspecified: Secondary | ICD-10-CM

## 2021-02-03 LAB — CUP PACEART REMOTE DEVICE CHECK
Battery Remaining Longevity: 44 mo
Battery Remaining Percentage: 48 %
Battery Voltage: 2.93 V
Date Time Interrogation Session: 20220412070247
HighPow Impedance: 66 Ohm
HighPow Impedance: 66 Ohm
Implantable Lead Implant Date: 20160629
Implantable Lead Implant Date: 20160629
Implantable Lead Implant Date: 20180416
Implantable Lead Location: 753858
Implantable Lead Location: 753859
Implantable Lead Location: 753860
Implantable Lead Model: 7122
Implantable Pulse Generator Implant Date: 20180416
Lead Channel Impedance Value: 410 Ohm
Lead Channel Impedance Value: 460 Ohm
Lead Channel Impedance Value: 540 Ohm
Lead Channel Pacing Threshold Amplitude: 0.375 V
Lead Channel Pacing Threshold Amplitude: 0.5 V
Lead Channel Pacing Threshold Pulse Width: 0.5 ms
Lead Channel Pacing Threshold Pulse Width: 0.5 ms
Lead Channel Sensing Intrinsic Amplitude: 0.2 mV
Lead Channel Sensing Intrinsic Amplitude: 5.7 mV
Lead Channel Setting Pacing Amplitude: 2 V
Lead Channel Setting Pacing Amplitude: 2 V
Lead Channel Setting Pacing Pulse Width: 0.5 ms
Lead Channel Setting Pacing Pulse Width: 0.5 ms
Lead Channel Setting Sensing Sensitivity: 0.5 mV
Pulse Gen Serial Number: 7413271

## 2021-02-04 ENCOUNTER — Other Ambulatory Visit: Payer: Self-pay

## 2021-02-04 ENCOUNTER — Encounter (HOSPITAL_COMMUNITY)
Admission: RE | Admit: 2021-02-04 | Discharge: 2021-02-04 | Disposition: A | Discharge status: Home / Self Care | Payer: Medicare Other | Source: Ambulatory Visit | Attending: Cardiology | Admitting: Cardiology

## 2021-02-04 DIAGNOSIS — I5022 Chronic systolic (congestive) heart failure: Secondary | ICD-10-CM | POA: Diagnosis not present

## 2021-02-04 DIAGNOSIS — Z006 Encounter for examination for normal comparison and control in clinical research program: Secondary | ICD-10-CM

## 2021-02-04 NOTE — Research (Signed)
Randomization # - [id number]          Week 2   Titration Equivalent Visit Visit Date 13/APR/2022  Visit Date   Visit Date (dd/MMM/yyyy):    02/04/21     Titration Equivalent Visit (for control arm subjects)   Did the Titration Equivalent Visit Occur?  [x]  Yes (Complete Below) []  No (Complete Protocol Deviation)   Was the visit in person or a phone visit? [x]  In Person []  Phone   Is this the last Titration Equivalent Visit? []  Yes [x]  No      Has the subject been hospitalized or died since last visit?   []  Yes [x]  No   Did subject report any adverse events? []  Yes [x]  No    If yes, complete AE form.                                                                         02/04/21 Printed Name       Date                                                Health Status Questions  Randomization #[id number]  Health Status Questions  Were the questions asked? [x]  Yes []  No  Was the assessment collected on the same date as the Visit Date? [x]  Yes []  No  Date (dd/MMM/yyyy):    Individual(s) questioned: (Check all that apply) [x]  Patient []  Caregiver/Carer []  Family member(s) []  Other individual(s) accompanying patient     Since your last visit here or the last time that we spoke with one another, have any of the following happened for the first time, or worsened, that we have not talked about previously? (Check all that apply) []  Coughing or wheezing []  Chest pain []  Difficulty concentrating or decreased alertness [x]  Fatigue and weakness []  Sensation of rapid or irregular heartbeat []  More difficulty getting your chores done or        taking care of yourself []  More shortness of breath []  Lightheadedness or fainting []  Shortness of breath when you are physically        active or when you lie down []  Swelling in your legs, ankles or feet; fluid        retention;swelling  around your stomach []  Water retention []  None of the above  Did you see a health care provider for any of these or other urgent reason []  Yes [x]  No  If Yes, where? []  Clinic or office []  Emergency room []  Urgent care []  Hospital (stayed one or more nights)     If yes, what kind of health care provider did you see? []  Emergency or urgent care doctor []  Family doctor, general practitioner []  Cardiologist (or heart failure doctor) []  Nurse []  Pharmacist []  Other   Other, specify         Since your last visit here or the last time that we spoke with one another, have you had any Concomitant Medication Changes? [] Yes (Modify the appropriate Concomitant Medication form in the casebook) [x]  No   Since the  last visit or the last time that you spoke with the subject, has the subject had any Adverse Event  []  Yes (Create AE/SAE form as needed) [x]  No    Since the last visit or the last time that you spoke with the subject, has the subject had any Adverse Device Events or Device Malfunctions? []  Yes (Create AE/SAE/Device Malfunction           form as needed) []  No [x]  Not applicable   Since your last visit here or the last time that we spoke with one another, have there been any other changes in how you (the patient) have been feeling, or how you (the patient) have been doing, or a health problem that we have not talked about? []  Yes [x]  No   If yes, list:

## 2021-02-04 NOTE — Progress Notes (Signed)
Daily Session Note  Patient Details  Name: Brandon Torres MRN: [id number] Date of Birth: 03-12-1966 Referring Provider:   Flowsheet Row CARDIAC REHAB PHASE II ORIENTATION from 01/05/2021 in [place]  Referring Provider Dr. Aundra Dubin      Encounter Date: 02/04/2021  Check In:  Session Check In - 02/04/21 0815      Check-In   Supervising physician immediately available to respond to emergencies [facility] MD immediately available    Physician(s) Dr. Melene Muller    Location [facility]    Staff Present Cathren Harsh, MS, Exercise Physiologist;Dalton Kris Mouton, MS, ACSM-CEP, Exercise Physiologist    Virtual Visit No    Medication changes reported     No    Fall or balance concerns reported    No    Tobacco Cessation No Change    Warm-up and Cool-down Performed as group-led instruction    Resistance Training Performed Yes    VAD Patient? No    PAD/SET Patient? No      Pain Assessment   Currently in Pain? No/denies    Pain Score 0-No pain    Multiple Pain Sites No           Capillary Blood Glucose: No results found for this or any previous visit (from the past 24 hour(s)).    Social History   Tobacco Use  Smoking Status Never Smoker  Smokeless Tobacco Never Used    Goals Met:  Independence with exercise equipment Exercise tolerated well No report of cardiac concerns or symptoms Strength training completed today  Goals Unmet:  Not Applicable  Comments: check out 0915   Dr. Kathie Dike is Medical Director for [facility] Pulmonary Rehab.

## 2021-02-06 ENCOUNTER — Other Ambulatory Visit: Payer: Self-pay

## 2021-02-06 ENCOUNTER — Encounter (HOSPITAL_COMMUNITY)
Admission: RE | Admit: 2021-02-06 | Discharge: 2021-02-06 | Disposition: A | Discharge status: Home / Self Care | Payer: Medicare Other | Source: Ambulatory Visit | Attending: Cardiology | Admitting: Cardiology

## 2021-02-06 DIAGNOSIS — I5022 Chronic systolic (congestive) heart failure: Secondary | ICD-10-CM

## 2021-02-06 NOTE — Progress Notes (Signed)
Daily Session Note  Patient Details  Name: Brandon Torres MRN: [id number] Date of Birth: 03-03-1966 Referring Provider:   Flowsheet Row CARDIAC REHAB PHASE II ORIENTATION from 01/05/2021 in [place]  Referring Provider Dr. Aundra Dubin      Encounter Date: 02/06/2021  Check In:  Session Check In - 02/06/21 0815      Check-In   Supervising physician immediately available to respond to emergencies [facility] immediately available    Physician(s) Dr. Harrington Challenger    Location [facility]    Staff Present Cathren Harsh, MS, Exercise Physiologist;Dalton Kris Mouton, MS, ACSM-CEP, Exercise Physiologist    Virtual Visit No    Medication changes reported     No    Fall or balance concerns reported    No    Tobacco Cessation No Change    Warm-up and Cool-down Performed as group-led instruction    Resistance Training Performed Yes    VAD Patient? No    PAD/SET Patient? No      Pain Assessment   Currently in Pain? No/denies    Pain Score 0-No pain    Multiple Pain Sites No           Capillary Blood Glucose: No results found for this or any previous visit (from the past 24 hour(s)).    Social History   Tobacco Use  Smoking Status Never Smoker  Smokeless Tobacco Never Used    Goals Met:  Independence with exercise equipment Exercise tolerated well No report of cardiac concerns or symptoms Strength training completed today  Goals Unmet:  Not Applicable  Comments: check out 0915   Dr. Kathie Dike is Medical Director for [facility] Pulmonary Rehab.

## 2021-02-09 ENCOUNTER — Other Ambulatory Visit: Payer: Self-pay

## 2021-02-09 ENCOUNTER — Encounter (HOSPITAL_COMMUNITY)
Admission: RE | Admit: 2021-02-09 | Discharge: 2021-02-09 | Disposition: A | Discharge status: Home / Self Care | Payer: Medicare Other | Source: Ambulatory Visit | Attending: Cardiology | Admitting: Cardiology

## 2021-02-09 VITALS — Wt 317.0 lb

## 2021-02-09 DIAGNOSIS — I5022 Chronic systolic (congestive) heart failure: Secondary | ICD-10-CM

## 2021-02-09 NOTE — Progress Notes (Signed)
Daily Session Note  Patient Details  Name: Brandon Torres MRN: [id number] Date of Birth: 02-29-1966 Referring Provider:   Flowsheet Row CARDIAC REHAB PHASE II ORIENTATION from 01/05/2021 in [place]  Referring Provider Dr. Aundra Dubin      Encounter Date: 02/09/2021  Check In:  Session Check In - 02/09/21 0815      Check-In   Supervising physician immediately available to respond to emergencies [facility] MD immediately available    Physician(s) Dr. Harl Bowie    Location [facility]    Staff Present Cathren Harsh, MS, Exercise Physiologist;Diksha Tagliaferro Kris Mouton, MS, ACSM-CEP, Exercise Physiologist    Virtual Visit No    Medication changes reported     No    Fall or balance concerns reported    No    Tobacco Cessation No Change    Warm-up and Cool-down Performed as group-led instruction    Resistance Training Performed Yes    VAD Patient? No    PAD/SET Patient? No      Pain Assessment   Currently in Pain? No/denies    Pain Score 0-No pain    Multiple Pain Sites No           Capillary Blood Glucose: No results found for this or any previous visit (from the past 24 hour(s)).    Social History   Tobacco Use  Smoking Status Never Smoker  Smokeless Tobacco Never Used    Goals Met:  Independence with exercise equipment Exercise tolerated well No report of cardiac concerns or symptoms Strength training completed today  Goals Unmet:  Not Applicable  Comments: checkout time is 0915   Dr. Kathie Dike is Medical Director for [facility] Pulmonary Rehab.

## 2021-02-11 ENCOUNTER — Encounter (HOSPITAL_COMMUNITY)
Admission: RE | Admit: 2021-02-11 | Discharge: 2021-02-11 | Disposition: A | Discharge status: Home / Self Care | Payer: Medicare Other | Source: Ambulatory Visit | Attending: Cardiology | Admitting: Cardiology

## 2021-02-11 ENCOUNTER — Other Ambulatory Visit: Payer: Self-pay

## 2021-02-11 DIAGNOSIS — I5022 Chronic systolic (congestive) heart failure: Secondary | ICD-10-CM | POA: Diagnosis not present

## 2021-02-11 NOTE — Progress Notes (Signed)
Daily Session Note  Patient Details  Name: Brandon Torres MRN: [id number] Date of Birth: 04/09/1966 Referring Provider:   Flowsheet Row CARDIAC REHAB PHASE II ORIENTATION from 01/05/2021 in [place] CARDIAC REHABILITATION  Referring Provider Dr. McLean      Encounter Date: 02/11/2021  Check In:  Session Check In - 02/11/21 0815      Check-In   Supervising physician immediately available to respond to emergencies [facility] immediately available    Physician(s) Dr. Branch    Location [facility]    Staff Present Madison Karch, MS, Exercise Physiologist;Dalton Fletcher, MS, ACSM-CEP, Exercise Physiologist    Virtual Visit No    Medication changes reported     No    Fall or balance concerns reported    No    Tobacco Cessation No Change    Warm-up and Cool-down Performed as group-led instruction    Resistance Training Performed Yes    VAD Patient? No    PAD/SET Patient? No      Pain Assessment   Currently in Pain? No/denies    Pain Score 0-No pain    Multiple Pain Sites No           Capillary Blood Glucose: No results found for this or any previous visit (from the past 24 hour(s)).    Social History   Tobacco Use  Smoking Status Never Smoker  Smokeless Tobacco Never Used    Goals Met:  Independence with exercise equipment Exercise tolerated well No report of cardiac concerns or symptoms Strength training completed today  Goals Unmet:  Not Applicable  Comments: check out 0915   Dr. Jehanzeb Memon is Medical Director for [facility] Pulmonary Rehab. 

## 2021-02-13 ENCOUNTER — Other Ambulatory Visit: Payer: Self-pay

## 2021-02-13 ENCOUNTER — Encounter (HOSPITAL_COMMUNITY)
Admission: RE | Admit: 2021-02-13 | Discharge: 2021-02-13 | Disposition: A | Discharge status: Home / Self Care | Payer: Medicare Other | Source: Ambulatory Visit | Attending: Cardiology | Admitting: Cardiology

## 2021-02-13 DIAGNOSIS — I5022 Chronic systolic (congestive) heart failure: Secondary | ICD-10-CM | POA: Diagnosis not present

## 2021-02-13 NOTE — Progress Notes (Signed)
Daily Session Note  Patient Details  Name: Brandon Torres MRN: [id number] Date of Birth: 15-May-1966 Referring Provider:   Flowsheet Row CARDIAC REHAB PHASE II ORIENTATION from 01/05/2021 in [place]  Referring Provider Dr. Aundra Dubin      Encounter Date: 02/13/2021  Check In:  Session Check In - 02/13/21 0815      Check-In   Supervising physician immediately available to respond to emergencies [facility] MD immediately available    Physician(s) Dr. Domenic Polite    Location [facility]    Staff Present Cathren Harsh, MS, Exercise Physiologist;Debra Wynetta Emery, RN, BSN    Virtual Visit No    Medication changes reported     No    Fall or balance concerns reported    No    Tobacco Cessation No Change    Warm-up and Cool-down Performed as group-led instruction    Resistance Training Performed Yes    VAD Patient? No    PAD/SET Patient? No      Pain Assessment   Currently in Pain? No/denies    Pain Score 0-No pain    Multiple Pain Sites No           Capillary Blood Glucose: No results found for this or any previous visit (from the past 24 hour(s)).    Social History   Tobacco Use  Smoking Status Never Smoker  Smokeless Tobacco Never Used    Goals Met:  Independence with exercise equipment Exercise tolerated well No report of cardiac concerns or symptoms Strength training completed today  Goals Unmet:  Not Applicable  Comments: check out 0915   Dr. Kathie Dike is Medical Director for [facility] Pulmonary Rehab.

## 2021-02-16 ENCOUNTER — Encounter (HOSPITAL_COMMUNITY)
Admission: RE | Admit: 2021-02-16 | Discharge: 2021-02-16 | Disposition: A | Discharge status: Home / Self Care | Payer: Medicare Other | Source: Ambulatory Visit | Attending: Cardiology | Admitting: Cardiology

## 2021-02-16 ENCOUNTER — Other Ambulatory Visit: Payer: Self-pay

## 2021-02-16 DIAGNOSIS — I5022 Chronic systolic (congestive) heart failure: Secondary | ICD-10-CM | POA: Diagnosis not present

## 2021-02-16 NOTE — Progress Notes (Signed)
Daily Session Note  Patient Details  Name: Brandon Torres MRN: [id number] Date of Birth: November 20, 1965 Referring Provider:   Flowsheet Row CARDIAC REHAB PHASE II ORIENTATION from 01/05/2021 in [place]  Referring Provider Dr. Aundra Dubin      Encounter Date: 02/16/2021  Check In:  Session Check In - 02/16/21 0815      Check-In   Supervising physician immediately available to respond to emergencies [facility] MD immediately available    Physician(s) Dr. Domenic Polite    Location [facility]    Staff Present Cathren Harsh, MS, Exercise Physiologist;Debra Wynetta Emery, RN, BSN    Virtual Visit No    Medication changes reported     No    Fall or balance concerns reported    No    Tobacco Cessation No Change    Warm-up and Cool-down Performed as group-led instruction    Resistance Training Performed Yes    VAD Patient? No    PAD/SET Patient? No      Pain Assessment   Currently in Pain? No/denies    Pain Score 0-No pain    Multiple Pain Sites No           Capillary Blood Glucose: No results found for this or any previous visit (from the past 24 hour(s)).    Social History   Tobacco Use  Smoking Status Never Smoker  Smokeless Tobacco Never Used    Goals Met:  Independence with exercise equipment Exercise tolerated well No report of cardiac concerns or symptoms Strength training completed today  Goals Unmet:  Not Applicable  Comments: check out 0915   Dr. Kathie Dike is Medical Director for [facility] Pulmonary Rehab.

## 2021-02-18 ENCOUNTER — Other Ambulatory Visit: Payer: Self-pay

## 2021-02-18 ENCOUNTER — Encounter (HOSPITAL_COMMUNITY)
Admission: RE | Admit: 2021-02-18 | Discharge: 2021-02-18 | Disposition: A | Discharge status: Home / Self Care | Payer: Medicare Other | Source: Ambulatory Visit | Attending: Cardiology | Admitting: Cardiology

## 2021-02-18 DIAGNOSIS — I5022 Chronic systolic (congestive) heart failure: Secondary | ICD-10-CM | POA: Diagnosis not present

## 2021-02-18 NOTE — Progress Notes (Signed)
Daily Session Note  Patient Details  Name: Brandon Torres MRN: [id number] Date of Birth: [birth year] Referring Provider:   Flowsheet Row CARDIAC REHAB PHASE II ORIENTATION from 01/05/2021 in [place]  Referring Provider Dr. Aundra Dubin      Encounter Date: 02/18/2021  Check In:  Session Check In - 02/18/21 0810      Check-In   Supervising physician immediately available to respond to emergencies [facility] MD immediately available    Physician(s) Dr. Domenic Polite    Location [facility]    Staff Present Cathren Harsh, MS, Exercise Physiologist;Chelsy Parrales Wynetta Emery, RN, Bjorn Loser, MS, ACSM-CEP, Exercise Physiologist    Virtual Visit No    Medication changes reported     No    Fall or balance concerns reported    No    Tobacco Cessation No Change    Warm-up and Cool-down Performed as group-led instruction    Resistance Training Performed Yes    VAD Patient? No    PAD/SET Patient? No      Pain Assessment   Currently in Pain? No/denies    Pain Score 0-No pain    Multiple Pain Sites No           Capillary Blood Glucose: No results found for this or any previous visit (from the past 24 hour(s)).    Social History   Tobacco Use  Smoking Status Never Smoker  Smokeless Tobacco Never Used    Goals Met:  Independence with exercise equipment Exercise tolerated well No report of cardiac concerns or symptoms Strength training completed today  Goals Unmet:  Not Applicable  Comments: Check out 915.   Dr. Kathie Dike is Medical Director for [facility] Pulmonary Rehab.

## 2021-02-18 NOTE — Progress Notes (Signed)
Remote ICD transmission.   

## 2021-02-20 ENCOUNTER — Other Ambulatory Visit: Payer: Self-pay

## 2021-02-20 ENCOUNTER — Encounter (HOSPITAL_COMMUNITY)
Admission: RE | Admit: 2021-02-20 | Discharge: 2021-02-20 | Disposition: A | Discharge status: Home / Self Care | Payer: Medicare Other | Source: Ambulatory Visit | Attending: Cardiology | Admitting: Cardiology

## 2021-02-20 DIAGNOSIS — I5022 Chronic systolic (congestive) heart failure: Secondary | ICD-10-CM | POA: Diagnosis not present

## 2021-02-20 NOTE — Progress Notes (Signed)
Daily Session Note  Patient Details  Name: Brandon Torres MRN: [id number] Date of Birth: 18-Oct-1966 Referring Provider:   Flowsheet Row CARDIAC REHAB PHASE II ORIENTATION from 01/05/2021 in [place]  Referring Provider Dr. Aundra Dubin      Encounter Date: 02/20/2021  Check In:  Session Check In - 02/20/21 0815      Check-In   Supervising physician immediately available to respond to emergencies [facility] MD immediately available    Physician(s) Dr. Domenic Polite    Location [facility]    Staff Present Cathren Harsh, MS, Exercise Physiologist;Dalton Kris Mouton, MS, ACSM-CEP, Exercise Physiologist    Virtual Visit No    Medication changes reported     No    Fall or balance concerns reported    No    Tobacco Cessation No Change    Warm-up and Cool-down Performed as group-led instruction    Resistance Training Performed Yes    VAD Patient? No    PAD/SET Patient? No      Pain Assessment   Currently in Pain? No/denies    Pain Score 0-No pain    Multiple Pain Sites No           Capillary Blood Glucose: No results found for this or any previous visit (from the past 24 hour(s)).    Social History   Tobacco Use  Smoking Status Never Smoker  Smokeless Tobacco Never Used    Goals Met:  Independence with exercise equipment Exercise tolerated well No report of cardiac concerns or symptoms Strength training completed today  Goals Unmet:  Not Applicable  Comments: check out 0915   Dr. Kathie Dike is Medical Director for [facility] Pulmonary Rehab.

## 2021-02-23 ENCOUNTER — Ambulatory Visit (HOSPITAL_COMMUNITY)
Admission: RE | Admit: 2021-02-23 | Discharge: 2021-02-23 | Disposition: A | Discharge status: Home / Self Care | Payer: 59 | Source: Ambulatory Visit

## 2021-02-23 ENCOUNTER — Encounter (HOSPITAL_COMMUNITY)
Admission: RE | Admit: 2021-02-23 | Discharge: 2021-02-23 | Disposition: A | Discharge status: Home / Self Care | Payer: Medicare Other | Source: Ambulatory Visit | Attending: Cardiology | Admitting: Cardiology

## 2021-02-23 ENCOUNTER — Other Ambulatory Visit: Payer: Self-pay

## 2021-02-23 VITALS — Wt 311.7 lb

## 2021-02-23 DIAGNOSIS — Z006 Encounter for examination for normal comparison and control in clinical research program: Secondary | ICD-10-CM

## 2021-02-23 DIAGNOSIS — I5022 Chronic systolic (congestive) heart failure: Secondary | ICD-10-CM | POA: Diagnosis not present

## 2021-02-23 DIAGNOSIS — I5023 Acute on chronic systolic (congestive) heart failure: Secondary | ICD-10-CM

## 2021-02-23 LAB — ECHOCARDIOGRAM COMPLETE
Area-P 1/2: 4.39 cm2
S' Lateral: 6.5 cm
Single Plane A4C EF: 25.6 %

## 2021-02-23 NOTE — Research (Signed)
Week 4                                            Cardiopulmonary Exam  Was the CP Exam Performed? [x] Yes (Complete Below) [] No (Complete Protocol Deviation)   Was the assessment collected on the same date as the Visit Date?  Date CP Exam Performed (dd/MMM/yyyy):  [x] Yes [] No  23-Feb-2021    Heart Sounds  S3: [x] Absent [] Present   S4: [x] Absent [] Present  Murmur  Murmur: [] Yes [] No   If Yes:  [] Systolic [] Diastolic   If yes select grade: [] I/VI [] II/VI [] III/VI [] IV/VI [] V/VI [] VI/VI   Chest Sounds  Rales: [] Yes [x] No   If yes check one: [] ? 1/3 of lung fields full [] 1/2 of lung fields full [] > 1/2 of lung fields full  If yes check one: [] Left [] Right [] Bilateral    Edema   Edema (pedal): [x] Absent [] Trace [] 1+ (slight) [] 2+ (moderate) [] 3+ (severe)    Hepatomegaly  Hepatomegaly: [x] None [] < 2 cm [] 2-4 cm [] > 4 cm  Peripheral Pulses  Peripheral Pulses: [x] Normal [] Abnormal   If Abnormal, explain:        Physical Exam    Physical Examination      Was the physical exam performed?    Was the assessment collected on the same date as the Visit Date?    If No - Date of Echo (dd/MMM/yyyy):    General Appearance     If abnormal, clinically significant?   Describe Clinically Relevant abnormalities:    Ophthalmologic     If abnormal, clinically significant?   Describe Clinically Relevant abnormalities:     HEENT     If abnormal, clinically significant?   Describe Clinically Relevant abnormalities:     Neurologic     If abnormal, clinically significant?  Describe Clinically Relevant abnormalities:     Gastrointestinal     If abnormal, clinically significant?   Describe Clinically Relevant abnormalities:    Dermatologic     If abnormal, clinically significant?  Describe Clinically Relevant  abnormalities:     Musculoskeletal    If abnormal, clinically significant?    Describe Clinically Relevant abnormalities:         Other [] Yes [] No (complete a protocol deviation)   [x] Yes [] No   23-Feb-2021    [x] Normal [] Abnormal [] Not Done  [x] Yes [] No       [x] Normal [] Abnormal [] Not Done  [] Yes [x] No        [x] Normal [] Abnormal [] Not Done  [] Yes [x] No         [x]Normal  []Abnormal  []Not Done   []Yes  []No         [x]Normal  []Abnormal  []Not Done      []Yes  []No         [x]Normal  []Abnormal  []Not Done    []Yes  []No          [x]Normal  []Abnormal  []Not Done    []Yes  []No          [x]Normal  []Abnormal  []Not Done   []  Yes  []No           Other, specify:      Result:     If abnormal, clinically significant?    Describe Clinically Relevant abnormalities     [x] Normal [] Abnormal [] Not Done  [] Yes [] No                                                Visits [id number]  Randomization #                                                     SCR#    Visit Date    02/23/21            Echo   Was the Echo acquired? [x] Yes (Complete Below) [] No (Complete Protocol Deviation)   Was the assessment collected on the same date as the Visit Date?   [x] Yes [] No     If No - Date of Echo (dd/MMM/yyyy):     Was the eligibility criterion met according to the core lab?   [] Yes (Left Ventricular Ejection Fraction meets eligibility criteria of ? 35% and left ventricular end-diastolic diameter (LVEDD) < 8.0 cm)  [] No (Left Ventricular Ejection Fraction does not meet eligibility criteria of ? 35% or  left ventricular end-diastolic diameter is not (LVEDD) < 8.0 cm,  .)    Send the Echo to the Core Lab as soon after acquisition as possible.            Vital Signs  Vital Signs  Were Vital Signs  Collected?  [x]Yes (Complete Below)  []No (Complete Protocol Deviation)   Was the assessment collected on the same date as the Visit Date?  [x]Yes  []No   If no -Date of vital signs (dd/MMM/yyyy):    Systolic Blood Pressure (mmHg):    226   Diastolic Blood Pressure (mmHg):    63  Weight: 307     Units:  []Kilograms  Pounds    Height: 72     Units:  []centimeters  [x]inches     Heart Rate (bpm): 71    Biomarkers   Were the blood samples drawn for the biomarkers? [x] Yes (Complete Below) [] No (Complete Protocol Deviation)   Was the assessment collected on the same date as the Visit Date? [x] Yes [] No   If no - Date blood samples drawn (dd/MMM/yyyy):      Were all of the biomarker samples shipped to the lab?   [x] Yes (Complete Below) [] No (Complete Protocol Deviation)   Date blood samples shipped (dd/MMM/yyyy): 23-Feb-2021     Were all the samples viable for analysis:   [x] Yes [] No (Complete Protocol Deviation)        6 Minute Walk 6 Minute Walk  Was the 6 minute walk performed? [x] Yes [] No (Complete Protocol Deviation)   Date and Time of 6-minute walk (dd/MMM/yyyy [id number] clock):  1400      See worksheet for results     24 Hour Holter   Was the 24 Hour Holter applied? [x] Yes (Complete Below) [] No (Complete Protocol Deviation)   Was  the assessment collected on the same date as the Visit Date? [x] Yes [] No   If no - Date 24 Hour Holter applied (dd/MMM/yyyy):     Was the 24 Hour Holter card uploaded or sent to the core lab?  [] Yes (Complete Below) [] No (Complete Protocol Deviation)    Date 24 Hour Holter uploaded or sent to the core lab (dd/MMM/yyyy):      Was there confirmation from the Holter core lab that the data was interpretable? Please do not erase card until after confirmation the data were received and interpretable   [] Yes [] No (Complete Protocol Deviation)        KCCQ  Use subject completed  source. Template is also in regulatory binder.  Was the KCCQ administered and completed? [x] Yes (Complete Below) [] No (Complete Protocol Deviation)   Was the assessment collected on the same date as the Visit Date? [x] Yes [] No   If no - Date KCCQ administered (dd/MMM/yyyy):     Was the KCCQ  administered In Person (at the site) or At Home (remotely)? [x] In Person [] At Home     See work sheet in patient binder. Used subject completed source.           EQ-5D Was the EQ-5D collected:   [x] Yes (Complete Below) [] No (Complete Protocol Deviation)  Was the assessment administered on the same date as the Visit Date? [x] Yes [] No  Date EQ-5D administered _02___/_MAY_/__2022        DD         MMM      YYYY   Was the EQ-5D administered In Person (at the site) or At Home (remotely)? [x] In Person [] At Home      See work sheet in patient binder. Used subject completed source.      Con Meds/AEs  Con Meds/AEs   Did the subject experience any Concomitant Medication Changes, Adverse Events, Serious Adverse Events, Adverse Device Events, Device Malfunctions, Hospital Admissions, or HF Hospitalization Equivalent visits since time of consent?  [] Yes (Complete appropriate worksheet and         for  [name] sites, collect billing information)  [x] No       Health Status Question   Were the question asked   [x] Yes  [] No  Was the assessment collected on the same date as the Visit date?  Date (dd/MMM/yyyy): [x] Yes  [] No  Individual(s) questioned: check all that apply) [x] Patient [] Caregiver/Carer []Family Members [] Other individuals accompanying patient  Since you last visit here or the last time that we spoke with one another, have any of the following happened for the first time, or worsened, that we have not talked about previously? (check all that apply)  []Coughing or Wheezing []Chest pain [] Difficulty concentrating or decreased alertness []fatigue and  weakness [] Sensation of rapid or irregular heartbeat [] More difficulty getting you chores done or taking care        of your self []More shortness of breath  [] Lightheadness or fainting  []Shortness of breath when you are physically active or       when you lie down [] Swelling in your legs, ankles or feet, fluid retention,      swelling around your stomach []  Water retention [x] None of the above   Did you see a health care provider for any of these or other urgent reason []  Yes  [x] No  If yes, Where? [] Clinic or office [] Emergency room, [] Urgent care  [] Hospital (stayed one or more nights) []   If yes, what kind of health care provider did you see? [] Emergency or urgent care  [] Family doctor, general practitioner []Cardiologist (or heart failure doctor) [] Nurse  [] pharmacy  [] other    Other, specify    Since you last visit here or the last time that we spoke with one another, have you had any Concomitant Medication Changes? [] Yes ( Modify the appropriate Concomitant Medication form in the casebook) [x] No  Since you last visit here or the last time that we spoke with the subject, has the subject had any Adverse Events or Serious Adverse Events not captured above? [] Yes (create AE/SAE/Device Malfunction form as        needed) [x] No [] Nat applicable  Since you last visit here or the last time that we spoke with one another have there been any other changes in how you ( the patient) have been feeling, or a health problem that we have not talking about?   IF yes, list:

## 2021-02-23 NOTE — Progress Notes (Signed)
  Echocardiogram 2D Echocardiogram has been performed.  Brandon Torres 02/23/2021, 1:50 PM

## 2021-02-23 NOTE — Progress Notes (Signed)
Daily Session Note  Patient Details  Name: Brandon Torres MRN: [id number] Date of Birth: 10/03/1966 Referring Provider:   Flowsheet Row CARDIAC REHAB PHASE II ORIENTATION from 01/05/2021 in [facility]  Referring Provider Dr. Aundra Dubin      Encounter Date: 02/23/2021  Check In:  Session Check In - 02/23/21 0815      Check-In   Supervising physician immediately available to respond to emergencies [facility] MD immediately available    Physician(s) Dr. Harl Bowie    Location [facility]    Staff Present Cathren Harsh, MS, Exercise Physiologist;Dalton Kris Mouton, MS, ACSM-CEP, Exercise Physiologist    Virtual Visit No    Medication changes reported     No    Fall or balance concerns reported    No    Tobacco Cessation No Change    Warm-up and Cool-down Performed as group-led instruction    Resistance Training Performed Yes    VAD Patient? No    PAD/SET Patient? No      Pain Assessment   Currently in Pain? No/denies    Pain Score 0-No pain    Multiple Pain Sites No           Capillary Blood Glucose: No results found for this or any previous visit (from the past 24 hour(s)).    Social History   Tobacco Use  Smoking Status Never Smoker  Smokeless Tobacco Never Used    Goals Met:  Independence with exercise equipment Exercise tolerated well No report of cardiac concerns or symptoms Strength training completed today  Goals Unmet:  Not Applicable  Comments: check out 0915   Dr. Kathie Dike is Medical Director for [facility].

## 2021-02-24 ENCOUNTER — Other Ambulatory Visit: Payer: Self-pay

## 2021-02-24 DIAGNOSIS — Z006 Encounter for examination for normal comparison and control in clinical research program: Secondary | ICD-10-CM

## 2021-02-25 ENCOUNTER — Other Ambulatory Visit: Payer: Self-pay

## 2021-02-25 ENCOUNTER — Encounter (HOSPITAL_COMMUNITY)
Admission: RE | Admit: 2021-02-25 | Discharge: 2021-02-25 | Disposition: A | Discharge status: Home / Self Care | Payer: Medicare Other | Source: Ambulatory Visit | Attending: Cardiology | Admitting: Cardiology

## 2021-02-25 DIAGNOSIS — I5022 Chronic systolic (congestive) heart failure: Secondary | ICD-10-CM

## 2021-02-25 NOTE — Progress Notes (Signed)
Cardiac Individual Treatment Plan  Patient Details  Name: Brandon Torres: [id number] Date of Birth: 16-Mar-1966 Referring Provider:   Flowsheet Row CARDIAC REHAB PHASE II ORIENTATION from 01/05/2021 in [facility] CARDIAC REHABILITATION  Referring Provider Dr. Shirlee Latch      Initial Encounter Date:  Flowsheet Row CARDIAC REHAB PHASE II ORIENTATION from 01/05/2021 in [place] CARDIAC REHABILITATION  Date 01/05/21      Visit Diagnosis: Chronic systolic congestive heart failure (HCC)  Patient's Home Medications on Admission:  Current Outpatient Medications:  .  acetaminophen (TYLENOL) 500 MG tablet, Take 1,000 mg by mouth daily as needed for mild pain or headache. , Disp: , Rfl:  .  ENTRESTO 97-103 MG, TAKE ONE TABLET BY MOUTH TWICE DAILY., Disp: 60 tablet, Rfl: 6 .  furosemide (LASIX) 40 MG tablet, Take 1 tablet (40 mg total) by mouth every morning AND 0.5 tablets (20 mg total) every evening., Disp: 135 tablet, Rfl: 3 .  metoprolol succinate (TOPROL-XL) 100 MG 24 hr tablet, TAKE (1) TABLET BY MOUTH TWICE DAILY. TAKE WITH OR IMMEDIATELY FOLLOWING A MEAL., Disp: 60 tablet, Rfl: 6 .  spironolactone (ALDACTONE) 25 MG tablet, Take 1 tablet (25 mg total) by mouth daily., Disp: 90 tablet, Rfl: 3 .  XARELTO 20 MG TABS tablet, TAKE ONE TABLET BY MOUTH EVERY DAY WITH DINNER., Disp: 30 tablet, Rfl: 6  Past Medical History: Past Medical History:  Diagnosis Date  . AICD (automatic cardioverter/defibrillator) present   . CHF (congestive heart failure) (HCC)   . Chronic anticoagulation   . Dysrhythmia    AFib  . Morbid obesity (HCC)   . Non-ischemic cardiomyopathy (HCC)   . OSA on CPAP    "have mask; can't tolerate mask" (04/23/2015)  . PAF (paroxysmal atrial fibrillation) (HCC)    RFA at [facility]+ dofetilide  . PONV (postoperative nausea and vomiting)    nausea  . Ventricular tachycardia (HCC)   . Wears glasses     Tobacco Use: Social History   Tobacco Use  Smoking Status Never Smoker   Smokeless Tobacco Never Used    Labs: Recent Review Flowsheet Data    Labs for ITP Cardiac and Pulmonary Rehab Latest Ref Rng & Units 01/04/2012 11/12/2014 11/12/2014 10/06/2015 03/29/2018   Cholestrol 0 - 200 192 - - 193 174   LDLCALC - 133(H) - - 129(H) 114   HDL >40 mg/dL 46 - - 48 -   Trlycerides 40 - 160 66 - - 81 82   Hemoglobin A1c 4.8 - 5.6 % - - - 5.4 -   PHART 7.350 - 7.450 - - 7.361 - -   PCO2ART 35.0 - 45.0 mmHg - - 40.6 - -   HCO3 20.0 - 24.0 mEq/L - 23.6 23.0 - -   TCO2 0 - 100 mmol/L - 25 24 - -   ACIDBASEDEF 0.0 - 2.0 mmol/L - 2.0 2.0 - -   O2SAT % - 69.0 98.0 - -      Capillary Blood Glucose: No results found for: GLUCAP   Exercise Target Goals: Exercise Program Goal: Individual exercise prescription set using results from initial 6 min walk test and THRR while considering  patient's activity barriers and safety.   Exercise Prescription Goal: Starting with aerobic activity 30 plus minutes a day, 3 days per week for initial exercise prescription. Provide home exercise prescription and guidelines that participant acknowledges understanding prior to discharge.  Activity Barriers & Risk Stratification:   6 Minute Walk:  6 Minute Walk  Row Name 01/05/21 1355         6 Minute Walk   Phase Initial     Distance 1400 feet     Walk Time 6 minutes     # of Rest Breaks 0     MPH 2.7     METS 3.07     RPE 12     VO2 Peak 10.74     Symptoms No     Resting HR 72 bpm     Resting BP 90/70     Resting Oxygen Saturation  96 %     Exercise Oxygen Saturation  during 6 min walk 94 %     Max Ex. HR 116 bpm     Max Ex. BP 110/72     2 Minute Post BP 86/70            Oxygen Initial Assessment:   Oxygen Re-Evaluation:   Oxygen Discharge (Final Oxygen Re-Evaluation):   Initial Exercise Prescription:  Initial Exercise Prescription - 01/05/21 1300      Date of Initial Exercise RX and Referring Provider   Date 01/05/21    Referring Provider Dr. Shirlee Latch     Expected Discharge Date 04/01/21      Treadmill   MPH 1.5    Grade 0    Minutes 22      NuStep   Level 1    SPM 80    Minutes 17      Prescription Details   Frequency (times per week) 3    Duration Progress to 30 minutes of continuous aerobic without signs/symptoms of physical distress      Intensity   THRR 40-80% of Max Heartrate 67-134    Ratings of Perceived Exertion 11-13      Progression   Progression Continue to progress workloads to maintain intensity without signs/symptoms of physical distress.      Resistance Training   Training Prescription Yes    Weight 4    Reps 10-15           Perform Capillary Blood Glucose checks as needed.  Exercise Prescription Changes:   Exercise Prescription Changes    Row Name 01/12/21 0900 01/19/21 0900 01/26/21 1000 02/09/21 1000 02/23/21 1300     Response to Exercise   Blood Pressure (Admit) 100/72 -- 92/64 98/65 96/62    Blood Pressure (Exercise) 104/74 -- 106/66 100/62 100/60   Blood Pressure (Exit) 92/70 -- 84/60 84/58 88/62    Heart Rate (Admit) 89 bpm -- 90 bpm 76 bpm 79 bpm   Heart Rate (Exercise) 117 bpm -- 119 bpm 120 bpm 120 bpm   Heart Rate (Exit) 90 bpm -- 99 bpm 85 bpm 88 bpm   Rating of Perceived Exertion (Exercise) 13 -- 12 12 12    Duration Continue with 30 min of aerobic exercise without signs/symptoms of physical distress. -- Continue with 30 min of aerobic exercise without signs/symptoms of physical distress. Continue with 30 min of aerobic exercise without signs/symptoms of physical distress. Continue with 30 min of aerobic exercise without signs/symptoms of physical distress.   Intensity THRR unchanged -- THRR unchanged THRR unchanged THRR unchanged     Progression   Progression Continue to progress workloads to maintain intensity without signs/symptoms of physical distress. -- Continue to progress workloads to maintain intensity without signs/symptoms of physical distress. Continue to progress workloads to  maintain intensity without signs/symptoms of physical distress. Continue to progress workloads to maintain intensity without signs/symptoms of physical distress.  Resistance Training   Training Prescription Yes -- Yes Yes Yes   Weight 5 -- 5 5 lbs 5 lbs   Reps 10-15 -- 10-15 10-15 10-15   Time 10 Minutes -- 10 Minutes 10 Minutes 10 Minutes     Treadmill   MPH 1.8 -- 2.2 2.2 2.4   Grade 0 -- 0 0 0   Minutes 22 -- 22 22 22    METs 2.38 -- 2.68 2.68 2.84     NuStep   Level 1 -- 2 3 4    SPM 78 -- 102 111 112   Minutes 17 -- 17 17 17    METs 1.9 -- 2.1 2.6 2.7     Home Exercise Plan   Plans to continue exercise at -- Home (comment) -- -- --   Frequency -- Add 3 additional days to program exercise sessions. -- -- --   Initial Home Exercises Provided -- 01/19/21 -- -- --          Exercise Comments:   Exercise Comments    Row Name 01/12/21 1610 01/19/21 0909         Exercise Comments Patient completed first exercise session today. He tolerated exercise well with no complaints. He looks forward to coming back to rehab. Reviewed home exercise program with patient today. He is currently walking at home 3 days per week for 30 minutes per day. He will continue to walk at home and we will continue to progress home exercise program.             Exercise Goals and Review:   Exercise Goals    Row Name 01/05/21 1358 01/26/21 1035 02/23/21 1341         Exercise Goals   Increase Physical Activity Yes Yes Yes     Intervention Develop an individualized exercise prescription for aerobic and resistive training based on initial evaluation findings, risk stratification, comorbidities and participant's personal goals.;Provide advice, education, support and counseling about physical activity/exercise needs. Develop an individualized exercise prescription for aerobic and resistive training based on initial evaluation findings, risk stratification, comorbidities and participant's personal  goals.;Provide advice, education, support and counseling about physical activity/exercise needs. Develop an individualized exercise prescription for aerobic and resistive training based on initial evaluation findings, risk stratification, comorbidities and participant's personal goals.;Provide advice, education, support and counseling about physical activity/exercise needs.     Expected Outcomes Short Term: Attend rehab on a regular basis to increase amount of physical activity.;Long Term: Add in home exercise to make exercise part of routine and to increase amount of physical activity.;Long Term: Exercising regularly at least 3-5 days a week. Short Term: Attend rehab on a regular basis to increase amount of physical activity.;Long Term: Add in home exercise to make exercise part of routine and to increase amount of physical activity.;Long Term: Exercising regularly at least 3-5 days a week. Short Term: Attend rehab on a regular basis to increase amount of physical activity.;Long Term: Add in home exercise to make exercise part of routine and to increase amount of physical activity.;Long Term: Exercising regularly at least 3-5 days a week.     Increase Strength and Stamina Yes Yes Yes     Intervention Provide advice, education, support and counseling about physical activity/exercise needs.;Develop an individualized exercise prescription for aerobic and resistive training based on initial evaluation findings, risk stratification, comorbidities and participant's personal goals. Provide advice, education, support and counseling about physical activity/exercise needs.;Develop an individualized exercise prescription for aerobic and resistive training based on initial  evaluation findings, risk stratification, comorbidities and participant's personal goals. Provide advice, education, support and counseling about physical activity/exercise needs.;Develop an individualized exercise prescription for aerobic and resistive  training based on initial evaluation findings, risk stratification, comorbidities and participant's personal goals.     Expected Outcomes Short Term: Increase workloads from initial exercise prescription for resistance, speed, and METs.;Short Term: Perform resistance training exercises routinely during rehab and add in resistance training at home;Long Term: Improve cardiorespiratory fitness, muscular endurance and strength as measured by increased METs and functional capacity ( ) Short Term: Increase workloads from initial exercise prescription for resistance, speed, and METs.;Short Term: Perform resistance training exercises routinely during rehab and add in resistance training at home;Long Term: Improve cardiorespiratory fitness, muscular endurance and strength as measured by increased METs and functional capacity ( ) Short Term: Increase workloads from initial exercise prescription for resistance, speed, and METs.;Short Term: Perform resistance training exercises routinely during rehab and add in resistance training at home;Long Term: Improve cardiorespiratory fitness, muscular endurance and strength as measured by increased METs and functional capacity ( )     Able to understand and use rate of perceived exertion (RPE) scale Yes Yes Yes     Intervention Provide education and explanation on how to use RPE scale Provide education and explanation on how to use RPE scale Provide education and explanation on how to use RPE scale     Expected Outcomes Short Term: Able to use RPE daily in rehab to express subjective intensity level;Long Term:  Able to use RPE to guide intensity level when exercising independently Short Term: Able to use RPE daily in rehab to express subjective intensity level;Long Term:  Able to use RPE to guide intensity level when exercising independently Short Term: Able to use RPE daily in rehab to express subjective intensity level;Long Term:  Able to use RPE to guide intensity level  when exercising independently     Knowledge and understanding of Target Heart Rate Range (THRR) Yes Yes Yes     Intervention Provide education and explanation of THRR including how the numbers were predicted and where they are located for reference Provide education and explanation of THRR including how the numbers were predicted and where they are located for reference Provide education and explanation of THRR including how the numbers were predicted and where they are located for reference     Expected Outcomes Short Term: Able to state/look up THRR;Long Term: Able to use THRR to govern intensity when exercising independently;Short Term: Able to use daily as guideline for intensity in rehab Short Term: Able to state/look up THRR;Long Term: Able to use THRR to govern intensity when exercising independently;Short Term: Able to use daily as guideline for intensity in rehab Short Term: Able to state/look up THRR;Long Term: Able to use THRR to govern intensity when exercising independently;Short Term: Able to use daily as guideline for intensity in rehab     Able to check pulse independently Yes Yes Yes     Intervention Provide education and demonstration on how to check pulse in carotid and radial arteries.;Review the importance of being able to check your own pulse for safety during independent exercise Provide education and demonstration on how to check pulse in carotid and radial arteries.;Review the importance of being able to check your own pulse for safety during independent exercise Provide education and demonstration on how to check pulse in carotid and radial arteries.;Review the importance of being able to check your own pulse for safety during independent exercise  Expected Outcomes Short Term: Able to explain why pulse checking is important during independent exercise;Long Term: Able to check pulse independently and accurately Short Term: Able to explain why pulse checking is important during  independent exercise;Long Term: Able to check pulse independently and accurately Short Term: Able to explain why pulse checking is important during independent exercise;Long Term: Able to check pulse independently and accurately     Understanding of Exercise Prescription Yes Yes Yes     Intervention Provide education, explanation, and written materials on patient's individual exercise prescription Provide education, explanation, and written materials on patient's individual exercise prescription Provide education, explanation, and written materials on patient's individual exercise prescription     Expected Outcomes Short Term: Able to explain program exercise prescription;Long Term: Able to explain home exercise prescription to exercise independently Short Term: Able to explain program exercise prescription;Long Term: Able to explain home exercise prescription to exercise independently Short Term: Able to explain program exercise prescription;Long Term: Able to explain home exercise prescription to exercise independently            Exercise Goals Re-Evaluation :  Exercise Goals Re-Evaluation    Row Name 01/26/21 1035 02/23/21 1342           Exercise Goal Re-Evaluation   Exercise Goals Review Increase Physical Activity;Increase Strength and Stamina;Able to understand and use rate of perceived exertion (RPE) scale;Knowledge and understanding of Target Heart Rate Range (THRR);Able to check pulse independently;Understanding of Exercise Prescription Increase Physical Activity;Increase Strength and Stamina;Able to understand and use rate of perceived exertion (RPE) scale;Knowledge and understanding of Target Heart Rate Range (THRR);Able to check pulse independently;Understanding of Exercise Prescription      Comments Patient has completed 7 exercise sessions. He has tolerated exercise well so far and is increasing intensities already. He is currently exercising at home 3 days per week for 30 minutes per  day outside of rehab. He walks at home outside. He is getting stronger and building up his stamina. He has progressed well so far and will continue to progress. He is currently exercising at 2.68 METs on the treadmill. Will continue to monitor and progress as able. Pt has completed 19 sessions. He continues to increase his exercise intensities and continues to exercise at home by walking an additional 3 days per week. He is currently exercising at 2.84 METs on the TM and 2.7 on the stepper. Will continue to monitor and progress as able.      Expected Outcomes Through exercise at rehab and with a home exercise program, patient will achieve their goals. Through exercise at rehab and with a home exercise program, patient will achieve their goals.              Discharge Exercise Prescription (Final Exercise Prescription Changes):  Exercise Prescription Changes - 02/23/21 1300      Response to Exercise   Blood Pressure (Admit) 96/62    Blood Pressure (Exercise) 100/60    Blood Pressure (Exit) 88/62    Heart Rate (Admit) 79 bpm    Heart Rate (Exercise) 120 bpm    Heart Rate (Exit) 88 bpm    Rating of Perceived Exertion (Exercise) 12    Duration Continue with 30 min of aerobic exercise without signs/symptoms of physical distress.    Intensity THRR unchanged      Progression   Progression Continue to progress workloads to maintain intensity without signs/symptoms of physical distress.      Resistance Training   Training Prescription Yes  Weight 5 lbs    Reps 10-15    Time 10 Minutes      Treadmill   MPH 2.4    Grade 0    Minutes 22    METs 2.84      NuStep   Level 4    SPM 112    Minutes 17    METs 2.7           Nutrition:  Target Goals: Understanding of nutrition guidelines, daily intake of sodium 1500mg , cholesterol 200mg , calories 30% from fat and 7% or less from saturated fats, daily to have 5 or more servings of fruits and vegetables.  Biometrics:  Pre Biometrics -  02/09/21 1027      Pre Biometrics   Weight 143.8 kg            Nutrition Therapy Plan and Nutrition Goals:  Nutrition Therapy & Goals - 01/19/21 1515      Personal Nutrition Goals   Comments We provide 2 heart healthy nutritional education sessions with handouts.      Intervention Plan   Intervention Nutrition handout(s) given to patient.           Nutrition Assessments:  Nutrition Assessments - 01/05/21 1402      MEDFICTS Scores   Pre Score 50          MEDIFICTS Score Key:  ?70 Need to make dietary changes   40-70 Heart Healthy Diet  ? 40 Therapeutic Level Cholesterol Diet   Picture Your Plate Scores:  <16 Unhealthy dietary pattern with much room for improvement.  41-50 Dietary pattern unlikely to meet recommendations for good health and room for improvement.  51-60 More healthful dietary pattern, with some room for improvement.   >60 Healthy dietary pattern, although there may be some specific behaviors that could be improved.    Nutrition Goals Re-Evaluation:   Nutrition Goals Discharge (Final Nutrition Goals Re-Evaluation):   Psychosocial: Target Goals: Acknowledge presence or absence of significant depression and/or stress, maximize coping skills, provide positive support system. Participant is able to verbalize types and ability to use techniques and skills needed for reducing stress and depression.  Initial Review & Psychosocial Screening:  Initial Psych Review & Screening - 01/05/21 1408      Initial Review   Current issues with None Identified      Family Dynamics   Good Support System? Yes    Comments Patient names his wife as his support system. He has a very positive outlook regarding his future and is glad to be doing the program. Will continue to monitor.      Barriers   Psychosocial barriers to participate in program There are no identifiable barriers or psychosocial needs.      Screening Interventions   Interventions  Encouraged to exercise    Expected Outcomes Short Term goal: Utilizing psychosocial counselor, staff and physician to assist with identification of specific Stressors or current issues interfering with healing process. Setting desired goal for each stressor or current issue identified.;Long Term Goal: Stressors or current issues are controlled or eliminated.;Short Term goal: Identification and review with participant of any Quality of Life or Depression concerns found by scoring the questionnaire.           Quality of Life Scores:  Quality of Life - 01/05/21 1400      Quality of Life   Select Quality of Life      Quality of Life Scores   Health/Function Pre 20.13 %  Socioeconomic Pre 20.93 %    Psych/Spiritual Pre 21.79 %    Family Pre 22.5 %    GLOBAL Pre 20.94 %          Scores of 19 and below usually indicate a poorer quality of life in these areas.  A difference of  2-3 points is a clinically meaningful difference.  A difference of 2-3 points in the total score of the Quality of Life Index has been associated with significant improvement in overall quality of life, self-image, physical symptoms, and general health in studies assessing change in quality of life.  PHQ-9: Recent Review Flowsheet Data    Depression screen [facility] 2/9 01/05/2021 07/24/2019 09/10/2016 06/17/2016   Decreased Interest 0 0 0 0   Down, Depressed, Hopeless 0 0 0 0   PHQ - 2 Score 0 0 0 0   Altered sleeping 0 - 0 0   Tired, decreased energy 1 - 0 1   Change in appetite 0 - 0 0   Feeling bad or failure about yourself  0 - 0 0   Trouble concentrating 0 - 0 0   Moving slowly or fidgety/restless 0 - 0 0   Suicidal thoughts 0 - 0 0   PHQ-9 Score 1 - 0 1   Difficult doing work/chores Not difficult at all - - Not difficult at all     Interpretation of Total Score  Total Score Depression Severity:  1-4 = Minimal depression, 5-9 = Mild depression, 10-14 = Moderate depression, 15-19 = Moderately severe  depression, 20-27 = Severe depression   Psychosocial Evaluation and Intervention:  Psychosocial Evaluation - 01/05/21 1411      Psychosocial Evaluation & Interventions   Interventions Stress management education;Relaxation education;Encouraged to exercise with the program and follow exercise prescription    Comments Patient has no psychosocial issues identified at his orientation visit. His initial QOL score was 20.94 overall and his PHQ-9 score was 1. Will continue to monitor.    Expected Outcomes Patient will have no psychosocial issues identified at discharge.    Continue Psychosocial Services  No Follow up required           Psychosocial Re-Evaluation:  Psychosocial Re-Evaluation    Row Name 01/19/21 1515 02/17/21 1351           Psychosocial Re-Evaluation   Current issues with None Identified None Identified      Comments Patient is new to the program completing 5 sessions. He continues to have no psychosocial barriers identified. He is hoping to get into the research study with Dr. Shirlee Latch to insert device to improve his left ventricular function. He has a positive outlook and is very interested in improving his health. Will continue to monitor. Patient has completed 16 sessions and continues to have no psychosocial barriers identified. He was not chosen for the research project but is still hopeful that the device will be avaliable soon for everyone. He has a positive outlook and is very interested in improving his health. Will continue to monitor.      Expected Outcomes Patient will have no psychosocial issues identified throughout the program or at disharge. Patient will have no psychosocial issues identified throughout the program or at [facility].      Interventions Encouraged to attend Cardiac Rehabilitation for the exercise Encouraged to attend Cardiac Rehabilitation for the exercise      Continue Psychosocial Services  No Follow up required No Follow up required  Psychosocial Discharge (Final Psychosocial Re-Evaluation):  Psychosocial Re-Evaluation - 02/17/21 1351      Psychosocial Re-Evaluation   Current issues with None Identified    Comments Patient has completed 16 sessions and continues to have no psychosocial barriers identified. He was not chosen for the research project but is still hopeful that the device will be avaliable soon for everyone. He has a positive outlook and is very interested in improving his health. Will continue to monitor.    Expected Outcomes Patient will have no psychosocial issues identified throughout the program or at disharge.    Interventions Encouraged to attend Cardiac Rehabilitation for the exercise    Continue Psychosocial Services  No Follow up required           Vocational Rehabilitation: Provide vocational rehab assistance to qualifying candidates.   Vocational Rehab Evaluation & Intervention:  Vocational Rehab - 01/05/21 1405      Vocational Rehab Re-[facility]   Comments Patient is disabled and does not plan to return to work. He does not need vocational rehab.           Education: Education Goals: Education classes will be provided on a weekly basis, covering required topics. Participant will state understanding/return demonstration of topics presented.  Learning Barriers/Preferences:  Learning Barriers/Preferences - 01/05/21 1402      Learning Barriers/Preferences   Learning Barriers None    Learning Preferences Audio;Written Material;Skilled Demonstration           Education Topics: Hypertension, Hypertension Reduction -Define heart disease and high blood pressure. Discus how high blood pressure affects the body and ways to reduce high blood pressure. Flowsheet Row CARDIAC REHAB PHASE II EXERCISE from [facility]2021 in [facility]  Date 01/28/21  Educator mk  Instruction Review Code 2- Demonstrated Understanding      Exercise and Your Heart -Discuss why it  is important to exercise, the FITT principles of exercise, normal and abnormal responses to exercise, and how to exercise safely. Flowsheet Row CARDIAC REHAB PHASE II EXERCISE from 02/18/2021 in [facility]  Date 02/04/21  Educator mk  Instruction Review Code 2- Demonstrated Understanding      Angina -Discuss definition of angina, causes of angina, treatment of angina, and how to decrease risk of having angina. Flowsheet Row CARDIAC REHAB PHASE II EXERCISE from 02/18/2021 in [facility]  Date 02/11/21  Educator mk  Instruction Review Code 2- Demonstrated Understanding      Cardiac Medications -Review what the following cardiac medications are used for, how they affect the body, and side effects that may occur when taking the medications.  Medications include Aspirin, Beta blockers, calcium channel blockers, ACE Inhibitors, angiotensin receptor blockers, diuretics, digoxin, and antihyperlipidemics. Flowsheet Row CARDIAC REHAB PHASE II EXERCISE from 02/18/2021 in [facility]  Date 02/18/21  [facility]ator [facility]  Instruction Review Code 1- Verbalizes Understanding      Congestive Heart Failure -Discuss the definition of CHF, how to live with CHF, the signs and symptoms of CHF, and how keep track of weight and sodium intake.   Heart Disease and Intimacy -Discus the effect sexual activity has on the heart, how changes occur during intimacy as we age, and safety during sexual activity.   Smoking Cessation / COPD -Discuss different methods to quit smoking, the health benefits of quitting smoking, and the definition of COPD. Flowsheet Row CARDIAC REHAB PHASE II EXERCISE from 09/08/2016 in [facility]  Date 07/07/16  Educator DJ  Instruction  Review Code (retired) 2- meets goals/outcomes      Nutrition I: Fats -Discuss the types of cholesterol, what cholesterol does to the heart, and how cholesterol levels  can be controlled. Flowsheet Row CARDIAC REHAB PHASE II EXERCISE from 09/08/2016 in [place] [facility]  Date 07/14/16  Educator [place]  Instruction Review Code 2- meets goals/outcomes      Nutrition II: Labels -Discuss the different components of food labels and how to read food label Flowsheet Row CARDIAC REHAB PHASE II EXERCISE from 09/08/2016 in [place] [facility]  Date 07/21/16  Educator Hart [place]  Instruction Review Code 2- meets goals/outcomes      Heart Parts/Heart Disease and PAD -Discuss the anatomy of the heart, the pathway of blood circulation through the heart, and these are affected by heart disease. Flowsheet Row CARDIAC REHAB PHASE II EXERCISE from 09/08/2016 in [place] [facility]  Date 07/28/16  Educator DC  Instruction Review Code 2- meets goals/outcomes      Stress I: Signs and Symptoms -Discuss the causes of stress, how stress may lead to anxiety and depression, and ways to limit stress. Flowsheet Row CARDIAC REHAB PHASE II EXERCISE from 09/08/2016 in [place] [facility]  Date 08/04/16  Educator Hart [place]  Instruction Review Code 2- meets goals/outcomes      Stress II: Relaxation -Discuss different types of relaxation techniques to limit stress. Flowsheet Row CARDIAC REHAB PHASE II EXERCISE from 02/18/2021 in [place] [facility]  Date 01/14/21  Educator mk  Instruction Review Code 2- Demonstrated Understanding      Warning Signs of Stroke / TIA -Discuss definition of a stroke, what the signs and symptoms are of a stroke, and how to identify when someone is having stroke. Flowsheet Row CARDIAC REHAB PHASE II EXERCISE from 02/18/2021 in [place] [facility]  Date 01/21/21  Educator mk  Instruction Review Code 2- Demonstrated Understanding      Knowledge Questionnaire Score:  Knowledge Questionnaire Score - 01/05/21 1404      Knowledge Questionnaire Score    Pre Score 22/24           Core Components/Risk Factors/Patient Goals at Admission:  Personal Goals and Risk Factors at Admission - 01/05/21 1405      Core Components/Risk Factors/Patient Goals on Admission    Weight Management Obesity;Weight Loss;Yes    Admit Weight 311 lb (141.1 kg)    Goal Weight: Short Term 306 lb (138.8 kg)    Goal Weight: Long Term 301 lb (136.5 kg)    Heart Failure Yes    Intervention Provide a combined exercise and nutrition program that is supplemented with education, support and counseling about heart failure. Directed toward relieving symptoms such as shortness of breath, decreased exercise tolerance, and extremity edema.    Expected Outcomes Improve functional capacity of life;Short term: Attendance in program 2-3 days a week with increased exercise capacity. Reported lower sodium intake. Reported increased fruit and vegetable intake. Reports medication compliance.;Short term: Daily weights obtained and reported for increase. Utilizing diuretic protocols set by physician.;Long term: Adoption of self-care skills and reduction of barriers for early signs and symptoms recognition and intervention leading to self-care maintenance.    Personal Goal Other Yes    Personal Goal Lose 10 to 15 lbs in the program and 60 to 70 lbs long term.    Intervention Provide cardiac rehab program supplemented with exercise and lifestyle changes at home.    Expected Outcomes Patient will complete the program  meeting both personal and program goals.           Core Components/Risk Factors/Patient Goals Review:   Goals and Risk Factor Review    Row Name 01/19/21 1529 02/17/21 1352           Core Components/Risk Factors/Patient Goals Review   Personal Goals Review Weight Management/Obesity Weight Management/Obesity      Review Patient is new to the program completing 5 sessions gaining 1 lb since his intial visit. He was referred to CR with chronic Systolic CHF. His personal  goals for the program are to lose 10 to 15 lbs in the program and more weight longterm. He is trying to get into a research study with Dr. Shirlee Latch to insert a deivce to stimulate his vagel nerve to improve left ventricular function. He had an echocardiogarm and carotid artery study completed 3/23 for the study. Echo results have not been released yet and doppler study was normal. He will find out this week if he will be chosen for the study. We will continue to monitor his progress as he works toward meeting these goals. Patient has completed 16 sessions gaining 1 lb since last 30 day review. He continues to do well in the program with consistent attendance with progression. He was not chosen to get the device to improve EF for the research study. He will still be in the study under the control group. He remains hopeful that the device will one day be avaliable. His blood pressure is well controlled.  His personal goals for the program are to lose 10 to 15 lbs in the program and lose 60 lbs long term. We will continue to monitor his progress as he works toward meeting these goals.      Expected Outcomes Patient will complete the program meeting both personal and program goals. Patient will complete the program meeting both personal and program goals.             Core Components/Risk Factors/Patient Goals at Discharge (Final Review):   Goals and Risk Factor Review - 02/17/21 1352      Core Components/Risk Factors/Patient Goals Review   Personal Goals Review Weight Management/Obesity    Review Patient has completed 16 sessions gaining 1 lb since last 30 day review. He continues to do well in the program with consistent attendance with progression. He was not chosen to get the device to improve EF for the research study. He will still be in the study under the control group. He remains hopeful that the device will one day be avaliable. His blood pressure is well controlled.  His personal goals for the  program are to lose 10 to 15 lbs in the program and lose 60 lbs long term. We will continue to monitor his progress as he works toward meeting these goals.    Expected Outcomes Patient will complete the program meeting both personal and program goals.           ITP Comments:   Comments: ITP REVIEW Pt is making expected progress toward Cardiac Rehab goals after completing 19 sessions. Recommend continued exercise, life style modification, education, and increased stamina and strength.

## 2021-02-25 NOTE — Progress Notes (Signed)
Daily Session Note  Patient Details  Name: Brandon Torres MRN: [id number] Date of Birth: Feb 27, 1966 Referring Provider:   Flowsheet Row CARDIAC REHAB PHASE II ORIENTATION from 01/05/2021 in [place]  Referring Provider Dr. Aundra Dubin      Encounter Date: 02/25/2021  Check In:  Session Check In - 02/25/21 0815      Check-In   Supervising physician immediately available to respond to emergencies [facility] MD immediately available    Physician(s) Dr. Harl Bowie    Location [facility]    Staff Present Cathren Harsh, MS, Exercise Physiologist;Blaike Newburn Kris Mouton, MS, ACSM-CEP, Exercise Physiologist    Virtual Visit No    Medication changes reported     No    Fall or balance concerns reported    No    Tobacco Cessation No Change    Warm-up and Cool-down Performed as group-led instruction    Resistance Training Performed Yes    VAD Patient? No    PAD/SET Patient? No      Pain Assessment   Currently in Pain? No/denies    Pain Score 0-No pain    Multiple Pain Sites No           Capillary Blood Glucose: No results found for this or any previous visit (from the past 24 hour(s)).    Social History   Tobacco Use  Smoking Status Never Smoker  Smokeless Tobacco Never Used    Goals Met:  Independence with exercise equipment Exercise tolerated well No report of cardiac concerns or symptoms Strength training completed today  Goals Unmet:  Not Applicable  Comments: checkout time is 0915   Dr. Kathie Dike is Medical Director for [facility].

## 2021-02-27 ENCOUNTER — Other Ambulatory Visit: Payer: Self-pay

## 2021-02-27 ENCOUNTER — Encounter (HOSPITAL_COMMUNITY)
Admission: RE | Admit: 2021-02-27 | Discharge: 2021-02-27 | Disposition: A | Discharge status: Home / Self Care | Payer: Medicare Other | Source: Ambulatory Visit | Attending: Cardiology | Admitting: Cardiology

## 2021-02-27 DIAGNOSIS — I5022 Chronic systolic (congestive) heart failure: Secondary | ICD-10-CM

## 2021-02-27 NOTE — Progress Notes (Signed)
Daily Session Note  Patient Details  Name: Brandon Torres MRN: [id number] Date of Birth: June 26, 1966 Referring Provider:   Flowsheet Row CARDIAC REHAB PHASE II ORIENTATION from 01/05/2021 in [place]  Referring Provider Dr. Aundra Dubin      Encounter Date: 02/27/2021  Check In:  Session Check In - 02/27/21 0814      Check-In   Supervising physician immediately available to respond to emergencies [facility] MD immediately available    Physician(s) Dr. Harl Bowie    Location [facility]    Staff Present Cathren Harsh, MS, Exercise Physiologist;[place]al[place] Kris Mouton, MS, ACSM-CEP, Exercise Physiologist    Virtual Visit No    Medication changes reported     No    Fall or balance concerns reported    No    Tobacco Cessation No Change    Warm-up and Cool-down Performed as group-led instruction    Resistance Training Performed Yes    VAD Patient? No    PAD/SET Patient? No      Pain Assessment   Currently in Pain? No/denies    Pain Score 0-No pain    Multiple Pain Sites No           Capillary Blood Glucose: No results found for this or any previous visit (from the past 24 hour(s)).    Social History   Tobacco Use  Smoking Status Never Smoker  Smokeless Tobacco Never Used    Goals Met:  Independence with exercise equipment Exercise tolerated well No report of cardiac concerns or symptoms Strength training completed today  Goals Unmet:  Not Applicable  Comments: check out 0915   Dr. Kathie Dike is Medical Director for [facility].

## 2021-03-02 ENCOUNTER — Other Ambulatory Visit: Payer: Self-pay

## 2021-03-02 ENCOUNTER — Encounter (HOSPITAL_COMMUNITY)
Admission: RE | Admit: 2021-03-02 | Discharge: 2021-03-02 | Disposition: A | Discharge status: Home / Self Care | Payer: Medicare Other | Source: Ambulatory Visit | Attending: Cardiology | Admitting: Cardiology

## 2021-03-02 DIAGNOSIS — I5022 Chronic systolic (congestive) heart failure: Secondary | ICD-10-CM

## 2021-03-02 NOTE — Progress Notes (Signed)
Daily Session Note  Patient Details  Name: Brandon Torres MRN: [id number] Date of Birth: 1966-09-25 Referring Provider:   Flowsheet Row CARDIAC REHAB PHASE II ORIENTATION from 01/05/2021 in [place]  Referring Provider Dr. Aundra Dubin      Encounter Date: 03/02/2021  Check In:  Session Check In - 03/02/21 0815      Check-In   Supervising physician immediately available to respond to emergencies [facility] MD immediately available    Physician(s) Dr. Domenic Polite    [facility]    Staff Present Cathren Harsh, MS, Exercise Physiologist;Dalton Kris Mouton, MS, ACSM-CEP, Exercise Physiologist    Virtual Visit No    Medication changes reported     No    Fall or balance concerns reported    No    Tobacco Cessation No Change    Warm-up and Cool-down Performed as group-led instruction    Resistance Training Performed Yes    VAD Patient? No    PAD/SET Patient? No      Pain Assessment   Currently in Pain? No/denies    Pain Score 0-No pain    Multiple Pain Sites No           Capillary Blood Glucose: No results found for this or any previous visit (from the past 24 hour(s)).    Social History   Tobacco Use  Smoking Status Never Smoker  Smokeless Tobacco Never Used    Goals Met:  Independence with exercise equipment Exercise tolerated well No report of cardiac concerns or symptoms Strength training completed today  Goals Unmet:  Not Applicable  Comments: check out 0915   Dr. Kathie Dike is Medical Director for [facility].

## 2021-03-04 ENCOUNTER — Other Ambulatory Visit: Payer: Self-pay

## 2021-03-04 ENCOUNTER — Encounter (HOSPITAL_COMMUNITY)
Admission: RE | Admit: 2021-03-04 | Discharge: 2021-03-04 | Disposition: A | Discharge status: Home / Self Care | Payer: Medicare Other | Source: Ambulatory Visit | Attending: Cardiology | Admitting: Cardiology

## 2021-03-04 ENCOUNTER — Encounter (HOSPITAL_COMMUNITY): Payer: Self-pay | Admitting: Adult Health

## 2021-03-04 DIAGNOSIS — I5022 Chronic systolic (congestive) heart failure: Secondary | ICD-10-CM

## 2021-03-04 NOTE — Progress Notes (Signed)
[facility] _[id number]                                                                                                                                                  Randomization # [id number]  -  [id number]  New York Heart Association  (NYHA)  These individuals should be listed on the site delegation of duties (DOD) log with these tasks delegated to them.  They should not play any other role in the study to keep them blinded to treatment assignment (Therapy Arm versus Control Arm).   New York Heart Association (NYHA) Heart Failure Classification   Was NYHA Performed? [x]  Yes (Complete Below) []  No (Complete Protocol Deviation)   Was the assessment collected on the same date as the Visit Date? [x]  Yes [x]  No   If no - Date of NYHA (dd/MMM/yyyy): 02/23/21  Was the NYHA administered In Person (at the site) or At Home (remotely)? [x]  In Person []  At Home    Indicate the subject's NYHA Classification at the time of the assessment:  []  Class I: No limitation of physical activity. Ordinary physical activity does not cause undue fatigue, palpitation or dyspnea (shortness of breath).  []  Class II: Slight limitation of physical activity. Comfortable at rest, but ordinary physical activity results in fatigue, palpitation or dyspnea.  [x]  Class III: Marked limitation of physical activity. Comfortable at rest, but less than ordinary activity causes fatigue, palpitation or dyspnea.  []  Class IV: Unable to carry out any physical activity without discomfort. Symptoms of cardiac insufficiency at rest. If any physical activity is undertaken, discomfort is increased.   Visit interval  []  Enrollment  [x]  Week 4 []  Week 10/ Final Titration []  3 month  []  6 month []  9 month []  12 month []  16 month  []  32 month []  48 month    Marie Chow NP-C  2:41 PM

## 2021-03-04 NOTE — Progress Notes (Signed)
Daily Session Note  Patient Details  Name: RORIK VESPA MRN: [id number] Date of Birth: 07/08/1966 Referring Provider:   Flowsheet Row CARDIAC REHAB PHASE II ORIENTATION from 01/05/2021 in [place]  Referring Provider Dr. Aundra Dubin      Encounter Date: 03/04/2021  Check In:  Session Check In - 03/04/21 0815      Check-In   Supervising physician immediately available to respond to emergencies [facility] MD immediately available    Physician(s) Dr. Harl Bowie    Location [facility]    Staff Present Cathren Harsh, MS, Exercise Physiologist;Debra Wynetta Emery, RN, BSN    Virtual Visit No    Medication changes reported     No    Fall or balance concerns reported    No    Tobacco Cessation No Change    Warm-up and Cool-down Performed as group-led instruction    Resistance Training Performed Yes    VAD Patient? No    PAD/SET Patient? No      Pain Assessment   Currently in Pain? No/denies    Pain Score 0-No pain    Multiple Pain Sites No           Capillary Blood Glucose: No results found for this or any previous visit (from the past 24 hour(s)).    Social History   Tobacco Use  Smoking Status Never Smoker  Smokeless Tobacco Never Used    Goals Met:  Independence with exercise equipment Exercise tolerated well No report of cardiac concerns or symptoms Strength training completed today  Goals Unmet:  Not Applicable  Comments: check out 0915   Dr. Kathie Dike is Medical Director for [facility].

## 2021-03-06 ENCOUNTER — Encounter (HOSPITAL_COMMUNITY)
Admission: RE | Admit: 2021-03-06 | Discharge: 2021-03-06 | Disposition: A | Discharge status: Home / Self Care | Payer: Medicare Other | Source: Ambulatory Visit | Attending: Cardiology | Admitting: Cardiology

## 2021-03-06 ENCOUNTER — Other Ambulatory Visit: Payer: Self-pay

## 2021-03-06 DIAGNOSIS — I5022 Chronic systolic (congestive) heart failure: Secondary | ICD-10-CM

## 2021-03-06 NOTE — Progress Notes (Signed)
Daily Session Note  Patient Details  Name: Brandon Torres MRN: [id number] Date of Birth: 1966/01/11 Referring Provider:   Flowsheet Row CARDIAC REHAB PHASE II ORIENTATION from 01/05/2021 in [place]  Referring Provider Dr. Aundra Dubin      Encounter Date: 03/06/2021  Check In:  Session Check In - 03/06/21 0815      Check-In   Supervising physician immediately available to respond to emergencies [facility] MD immediately available    Physician(s) Dr. Domenic Polite    [facility]    Staff Present Cathren Harsh, MS, Exercise Physiologist;Debra Wynetta Emery, RN, BSN    Virtual Visit No    Medication changes reported     No    Fall or balance concerns reported    No    Tobacco Cessation No Change    Warm-up and Cool-down Performed as group-led instruction    Resistance Training Performed Yes    VAD Patient? No    PAD/SET Patient? No      Pain Assessment   Currently in Pain? No/denies    Pain Score 0-No pain    Multiple Pain Sites No           Capillary Blood Glucose: No results found for this or any previous visit (from the past 24 hour(s)).    Social History   Tobacco Use  Smoking Status Never Smoker  Smokeless Tobacco Never Used    Goals Met:  Independence with exercise equipment Exercise tolerated well No report of cardiac concerns or symptoms Strength training completed today  Goals Unmet:  Not Applicable  Comments: check out 0915   Dr. Kathie Dike is Medical Director for [facility].

## 2021-03-09 ENCOUNTER — Encounter (HOSPITAL_COMMUNITY)
Admission: RE | Admit: 2021-03-09 | Discharge: 2021-03-09 | Disposition: A | Discharge status: Home / Self Care | Payer: Medicare Other | Source: Ambulatory Visit | Attending: Cardiology | Admitting: Cardiology

## 2021-03-09 ENCOUNTER — Other Ambulatory Visit: Payer: Self-pay

## 2021-03-09 VITALS — Wt 316.4 lb

## 2021-03-09 DIAGNOSIS — I5022 Chronic systolic (congestive) heart failure: Secondary | ICD-10-CM

## 2021-03-09 DIAGNOSIS — Z006 Encounter for examination for normal comparison and control in clinical research program: Secondary | ICD-10-CM

## 2021-03-09 NOTE — Progress Notes (Signed)
Daily Session Note  Patient Details  Name: Brandon Torres MRN: [id number] Date of Birth: Nov 18, 1965 Referring Provider:   Flowsheet Row CARDIAC REHAB PHASE II ORIENTATION from 01/05/2021 in [place]  Referring Provider Dr. Aundra Dubin      Encounter Date: 03/09/2021  Check In:  Session Check In - 03/09/21 0815      Check-In   Supervising physician immediately available to respond to emergencies [facility] MD immediately available    Physician(s) Dr. Johnsie Cancel    Location [facility]    Staff Present Cathren Harsh, MS, Exercise Physiologist;Dalton Kris Mouton, MS, ACSM-CEP, Exercise Physiologist    Virtual Visit No    Medication changes reported     No    Fall or balance concerns reported    No    Tobacco Cessation No Change    Warm-up and Cool-down Performed as group-led instruction    Resistance Training Performed Yes    VAD Patient? No    PAD/SET Patient? No      Pain Assessment   Currently in Pain? No/denies    Pain Score 0-No pain    Multiple Pain Sites No           Capillary Blood Glucose: No results found for this or any previous visit (from the past 24 hour(s)).    Social History   Tobacco Use  Smoking Status Never Smoker  Smokeless Tobacco Never Used    Goals Met:  Independence with exercise equipment Exercise tolerated well No report of cardiac concerns or symptoms Strength training completed today  Goals Unmet:  Not Applicable  Comments: check out 0915   Dr. Kathie Dike is Medical Director for [facility].

## 2021-03-11 ENCOUNTER — Encounter (HOSPITAL_COMMUNITY)
Admission: RE | Admit: 2021-03-11 | Discharge: 2021-03-11 | Disposition: A | Discharge status: Home / Self Care | Payer: Medicare Other | Source: Ambulatory Visit | Attending: Cardiology | Admitting: Cardiology

## 2021-03-11 ENCOUNTER — Other Ambulatory Visit: Payer: Self-pay

## 2021-03-11 DIAGNOSIS — I5022 Chronic systolic (congestive) heart failure: Secondary | ICD-10-CM

## 2021-03-11 NOTE — Progress Notes (Signed)
Daily Session Note  Patient Details  Name: HOMAR WEINKAUF MRN: [id number] Date of Birth: [birth year] Referring Provider:   Flowsheet Row CARDIAC REHAB PHASE II ORIENTATION from 01/05/2021 in [place]  Referring Provider Dr. Aundra Dubin      Encounter Date: 03/11/2021  Check In:  Session Check In - 03/11/21 0815      Check-In   Supervising physician immediately available to respond to emergencies [facility] MD immediately available    Physician(s) Dr. Harl Bowie    Location [facility]    Staff Present Cathren Harsh, MS, Exercise Physiologist;Dalton Kris Mouton, MS, ACSM-CEP, Exercise Physiologist    Virtual Visit No    Medication changes reported     No    Fall or balance concerns reported    No    Tobacco Cessation No Change    Warm-up and Cool-down Performed as group-led instruction    Resistance Training Performed Yes    VAD Patient? No    PAD/SET Patient? No      Pain Assessment   Currently in Pain? No/denies    Pain Score 0-No pain    Multiple Pain Sites No           Capillary Blood Glucose: No results found for this or any previous visit (from the past 24 hour(s)).    Social History   Tobacco Use  Smoking Status Never Smoker  Smokeless Tobacco Never Used    Goals Met:  Independence with exercise equipment Exercise tolerated well No report of cardiac concerns or symptoms Strength training completed today  Goals Unmet:  Not Applicable  Comments: check out 0915   Dr. Kathie Dike is Medical Director for [facility].

## 2021-03-13 ENCOUNTER — Encounter (HOSPITAL_COMMUNITY)
Admission: RE | Admit: 2021-03-13 | Discharge: 2021-03-13 | Disposition: A | Discharge status: Home / Self Care | Payer: Medicare Other | Source: Ambulatory Visit | Attending: Cardiology | Admitting: Cardiology

## 2021-03-13 ENCOUNTER — Other Ambulatory Visit: Payer: Self-pay

## 2021-03-13 DIAGNOSIS — I5022 Chronic systolic (congestive) heart failure: Secondary | ICD-10-CM

## 2021-03-13 NOTE — Progress Notes (Signed)
Daily Session Note  Patient Details  Name: Brandon Torres MRN: [id number] Date of Birth: [birth year] Referring Provider:   Flowsheet Row CARDIAC REHAB PHASE II ORIENTATION from 01/05/2021 in [place]  Referring Provider Dr. Aundra Dubin      Encounter Date: 03/13/2021  Check In:  Session Check In - 03/13/21 0815      Check-In   Supervising physician immediately available to respond to emergencies [facility] MD immediately available    Physician(s) Dr. Harl Bowie    Location [facility]    Staff Present Cathren Harsh, MS, Exercise Physiologist;Debra Wynetta Emery, RN, BSN    Virtual Visit No    Medication changes reported     No    Fall or balance concerns reported    No    Tobacco Cessation No Change    Warm-up and Cool-down Performed as group-led instruction    Resistance Training Performed Yes    VAD Patient? No    PAD/SET Patient? No      Pain Assessment   Currently in Pain? No/denies    Pain Score 0-No pain    Multiple Pain Sites No           Capillary Blood Glucose: No results found for this or any previous visit (from the past 24 hour(s)).    Social History   Tobacco Use  Smoking Status Never Smoker  Smokeless Tobacco Never Used    Goals Met:  Independence with exercise equipment Exercise tolerated well No report of cardiac concerns or symptoms Strength training completed today  Goals Unmet:  Not Applicable  Comments: checkout 0915     Dr. Kathie Dike is Medical Director for [facility].

## 2021-03-16 ENCOUNTER — Other Ambulatory Visit: Payer: Self-pay

## 2021-03-16 ENCOUNTER — Encounter (HOSPITAL_COMMUNITY)
Admission: RE | Admit: 2021-03-16 | Discharge: 2021-03-16 | Disposition: A | Discharge status: Home / Self Care | Payer: Medicare Other | Source: Ambulatory Visit | Attending: Cardiology | Admitting: Cardiology

## 2021-03-16 DIAGNOSIS — I5022 Chronic systolic (congestive) heart failure: Secondary | ICD-10-CM | POA: Diagnosis not present

## 2021-03-16 NOTE — Progress Notes (Signed)
Daily Session Note  Patient Details  Name: Brandon Torres MRN: [id number] Date of Birth: 05-Oct-1966 Referring Provider:   Flowsheet Row CARDIAC REHAB PHASE II ORIENTATION from 01/05/2021 in [place]  Referring Provider Dr. Aundra Dubin      Encounter Date: 03/16/2021  Check In:  Session Check In - 03/16/21 0815      Check-In   Supervising physician immediately available to respond to emergencies [facility] MD immediately available    Physician(s) Dr. Harrington Challenger    Location [facility]    Staff Present Cathren Harsh, MS, Exercise Physiologist;Debra Wynetta Emery, RN, BSN    Virtual Visit No    Medication changes reported     No    Fall or balance concerns reported    No    Tobacco Cessation No Change    Warm-up and Cool-down Performed as group-led instruction    Resistance Training Performed Yes    VAD Patient? No    PAD/SET Patient? No      Pain Assessment   Currently in Pain? No/denies    Pain Score 0-No pain    Multiple Pain Sites No           Capillary Blood Glucose: No results found for this or any previous visit (from the past 24 hour(s)).    Social History   Tobacco Use  Smoking Status Never Smoker  Smokeless Tobacco Never Used    Goals Met:  Independence with exercise equipment Exercise tolerated well No report of cardiac concerns or symptoms Strength training completed today  Goals Unmet:  Not Applicable  Comments: check out 0915   Dr. Kathie Dike is Medical Director for [facility].

## 2021-03-18 ENCOUNTER — Other Ambulatory Visit: Payer: Self-pay

## 2021-03-18 ENCOUNTER — Encounter (HOSPITAL_COMMUNITY)
Admission: RE | Admit: 2021-03-18 | Discharge: 2021-03-18 | Disposition: A | Discharge status: Home / Self Care | Payer: Medicare Other | Source: Ambulatory Visit | Attending: Cardiology | Admitting: Cardiology

## 2021-03-18 DIAGNOSIS — I5022 Chronic systolic (congestive) heart failure: Secondary | ICD-10-CM | POA: Diagnosis not present

## 2021-03-18 NOTE — Progress Notes (Signed)
Daily Session Note  Patient Details  Name: KAZUO DURNIL MRN: [id number] Date of Birth: 1966/08/24 Referring Provider:   Flowsheet Row CARDIAC REHAB PHASE II ORIENTATION from 01/05/2021 in [place]  Referring Provider Dr. Aundra Dubin      Encounter Date: 03/18/2021  Check In:  Session Check In - 03/18/21 0815      Check-In   Supervising physician immediately available to respond to emergencies [facility] MD immediately available    Physician(s) Dr. Domenic Polite    Location [facility]    Staff Present Cathren Harsh, MS, Exercise Physiologist;Debra Wynetta Emery, RN, BSN    Virtual Visit No    Medication changes reported     No    Fall or balance concerns reported    No    Tobacco Cessation No Change    Warm-up and Cool-down Performed as group-led instruction    Resistance Training Performed Yes    VAD Patient? No    PAD/SET Patient? No      Pain Assessment   Currently in Pain? No/denies    Pain Score 0-No pain    Multiple Pain Sites No           Capillary Blood Glucose: No results found for this or any previous visit (from the past 24 hour(s)).    Social History   Tobacco Use  Smoking Status Never Smoker  Smokeless Tobacco Never Used    Goals Met:  Independence with exercise equipment Exercise tolerated well No report of cardiac concerns or symptoms Strength training completed today  Goals Unmet:  Not Applicable  Comments: check out 0915   Dr. Kathie Dike is Medical Director for [facility].

## 2021-03-20 ENCOUNTER — Encounter (HOSPITAL_COMMUNITY): Payer: Medicare Other

## 2021-03-20 ENCOUNTER — Ambulatory Visit: Payer: 59 | Admitting: Cardiology

## 2021-03-20 DIAGNOSIS — Z006 Encounter for examination for normal comparison and control in clinical research program: Secondary | ICD-10-CM

## 2021-03-20 NOTE — Research (Unsigned)
   Randomization # - [id number]          Week 8   Titration Equivalent Visit Visit Date  Visit Date   Visit Date (dd/MMM/yyyy):    03/20/21     Titration Equivalent Visit (for control arm subjects)   Did the Titration Equivalent Visit Occur?  [x]  Yes (Complete Below) []  No (Complete Protocol Deviation)   Was the visit in person or a phone visit? []  In Person [x]  Phone   Is this the last Titration Equivalent Visit? []  Yes [x]  No      Has the subject been hospitalized or died since last visit?   []  Yes [x]  No   Did subject report any adverse events? []  Yes [x]  No    If yes, complete AE form.        , Jaimie Redditt                                                                  03/20/21 Printed Name       Date

## 2021-03-20 NOTE — Research (Addendum)
Randomization # - [id number]          Week __8_     Titration Equivalent Visit Visit Date  Visit Date   Visit Date (dd/MMM/yyyy):    03/20/21     Titration Equivalent Visit (for control arm subjects)   Did the Titration Equivalent Visit Occur?  [x]  Yes (Complete Below) []  No (Complete Protocol Deviation)   Was the visit in person or a phone visit? []  In Person [x]  Phone   Is this the last Titration Equivalent Visit? []  Yes [x]  No      Has the subject been hospitalized or died since last visit?   []  Yes [x]  No   Did subject report any adverse events? []  Yes [x]  No    If yes, complete AE form.        Brandon Torres                                                                  03/20/21 Printed Name       Date                                                Health Status Questions    Health Status Questions  Were the questions asked? [x]  Yes []  No  Was the assessment collected on the same date as the Visit Date? [x]  Yes []  No  Date (dd/MMM/yyyy):    Individual(s) questioned: (Check all that apply) [x]  Patient []  Caregiver/Carer []  Family member(s) []  Other individual(s) accompanying patient     Since your last visit here or the last time that we spoke with one another, have any of the following happened for the first time, or worsened, that we have not talked about previously? (Check all that apply) []  Coughing or wheezing []  Chest pain []  Difficulty concentrating or decreased alertness []  Fatigue and weakness []  Sensation of rapid or irregular heartbeat []  More difficulty getting your chores done or        taking care of yourself []  More shortness of breath []  Lightheadedness or fainting []  Shortness of breath when you are physically        active or when you lie down []  Swelling in your legs, ankles or feet; fluid        retention;swelling around your stomach []  Water  retention [x]  None of the above  Did you see a health care provider for any of these or other urgent reason []  Yes [x]  No  If Yes, where? []  Clinic or office []  Emergency room []  Urgent care []  Hospital (stayed one or more nights)     If yes, what kind of health care provider did you see? []  Emergency or urgent care doctor []  Family doctor, general practitioner []  Cardiologist (or heart failure doctor) []  Nurse []  Pharmacist []  Other   Other, specify         Since your last visit here or the last time that we spoke with one another, have you had any Concomitant Medication Changes? [] Yes (Modify the appropriate Concomitant Medication form in the casebook) [x]  No   Since the last  visit or the last time that you spoke with the subject, has the subject had any Adverse Event  []  Yes (Create AE/SAE form as needed) [x]  No    Since the last visit or the last time that you spoke with the subject, has the subject had any Adverse Device Events or Device Malfunctions? []  Yes (Create AE/SAE/Device Malfunction           form as needed) []  No [x]  Not applicable   Since your last visit here or the last time that we spoke with one another, have there been any other changes in how you (the patient) have been feeling, or how you (the patient) have been doing, or a health problem that we have not talked about? []  Yes [x]  No   If yes, list:

## 2021-03-23 ENCOUNTER — Encounter (HOSPITAL_COMMUNITY): Payer: Medicare Other

## 2021-03-24 NOTE — Research (Signed)
[facility] Week 6                                                                             Randomization # - [id number]          Week 6    Titration Equivalent Visit Visit Date  Visit Date   Visit Date (dd/MMM/yyyy):    03/09/21     Titration Equivalent Visit (for control arm subjects)   Did the Titration Equivalent Visit Occur?  [x]  Yes (Complete Below) []  No (Complete Protocol Deviation)   Was the visit in person or a phone visit? []  In Person [x]  Phone   Is this the last Titration Equivalent Visit? []  Yes [x]  No      Has the subject been hospitalized or died since last visit?   []  Yes [x]  No   Did subject report any adverse events? []  Yes [x]  No    If yes, complete AE form.        , Yazmina Pareja                                                                  03/09/21 Printed Name       Date                                                Health Status Questions  Randomization # [id number]  Health Status Questions  Were the questions asked? [x]  Yes []  No  Was the assessment collected on the same date as the Visit Date? [x]  Yes []  No  Date (dd/MMM/yyyy):    Individual(s) questioned: (Check all that apply) [x]  Patient []  Caregiver/Carer []  Family member(s) []  Other individual(s) accompanying patient     Since your last visit here or the last time that we spoke with one another, have any of the following happened for the first time, or worsened, that we have not talked about previously? (Check all that apply) []  Coughing or wheezing []  Chest pain []  Difficulty concentrating or decreased alertness []  Fatigue and weakness []  Sensation of rapid or irregular heartbeat []  More difficulty getting your chores done or        taking care of yourself []  More shortness of breath []  Lightheadedness or fainting []  Shortness of breath when you are physically        active or when you lie down []  Swelling in your legs, ankles or feet; fluid         retention;swelling around your stomach []  Water retention [x]  None of the above  Did you see a health care provider for any of these or other urgent reason []  Yes [x]  No  If Yes, where? []  Clinic or office []  Emergency room []  Urgent care []  Hospital (stayed one or more nights)     If yes, what kind of health  care provider did you see? []  Emergency or urgent care doctor []  Family doctor, general practitioner []  Cardiologist (or heart failure doctor) []  Nurse []  Pharmacist []  Other   Other, specify         Since your last visit here or the last time that we spoke with one another, have you had any Concomitant Medication Changes? [] Yes (Modify the appropriate Concomitant Medication form in the casebook) [x]  No   Since the last visit or the last time that you spoke with the subject, has the subject had any Adverse Event  []  Yes (Create AE/SAE form as needed) [x]  No    Since the last visit or the last time that you spoke with the subject, has the subject had any Adverse Device Events or Device Malfunctions? []  Yes (Create AE/SAE/Device Malfunction           form as needed) []  No [x]  Not applicable   Since your last visit here or the last time that we spoke with one another, have there been any other changes in how you (the patient) have been feeling, or how you (the patient) have been doing, or a health problem that we have not talked about? []  Yes [x]  No   If yes, list:

## 2021-03-25 ENCOUNTER — Encounter (HOSPITAL_COMMUNITY)
Admission: RE | Admit: 2021-03-25 | Discharge: 2021-03-25 | Disposition: A | Discharge status: Home / Self Care | Payer: Medicare Other | Source: Ambulatory Visit | Attending: Cardiology | Admitting: Cardiology

## 2021-03-25 ENCOUNTER — Other Ambulatory Visit: Payer: Self-pay

## 2021-03-25 DIAGNOSIS — I5022 Chronic systolic (congestive) heart failure: Secondary | ICD-10-CM | POA: Insufficient documentation

## 2021-03-25 NOTE — Progress Notes (Signed)
Daily Session Note  Patient Details  Name: Brandon Torres MRN: [id number] Date of Birth: October 25, 1966 Referring Provider:   Flowsheet Row CARDIAC REHAB PHASE II ORIENTATION from 01/05/2021 in [place]  Referring Provider Dr. Aundra Dubin      Encounter Date: 03/25/2021  Check In:  Session Check In - 03/25/21 0815      Check-In   Supervising physician immediately available to respond to emergencies [facility] MD immediately available    Physician(s) Dr. Harl Bowie    Location [facility]    Staff Present Cathren Harsh, MS, Exercise Physiologist;Dalton Kris Mouton, MS, ACSM-CEP, Exercise Physiologist    Virtual Visit No    Medication changes reported     No    Fall or balance concerns reported    No    Tobacco Cessation No Change    Warm-up and Cool-down Performed as group-led instruction    Resistance Training Performed Yes    VAD Patient? No    PAD/SET Patient? No      Pain Assessment   Currently in Pain? No/denies    Pain Score 0-No pain    Multiple Pain Sites No           Capillary Blood Glucose: No results found for this or any previous visit (from the past 24 hour(s)).    Social History   Tobacco Use  Smoking Status Never Smoker  Smokeless Tobacco Never Used    Goals Met:  Independence with exercise equipment Exercise tolerated well No report of cardiac concerns or symptoms Strength training completed today  Goals Unmet:  Not Applicable  Comments: check out 0915   Dr. Kathie Dike is Medical Director for [facility] Pul[facility] Re[facility].

## 2021-03-26 NOTE — Progress Notes (Signed)
Cardiac Individual Treatment Plan  Patient Details  Name: Brandon Torres MRN: [id number] Date of Birth: 16-Mar-1966 Referring Provider:   Flowsheet Row CARDIAC REHAB PHASE II ORIENTATION from 01/05/2021 in [facility] CARDIAC REHABILITATION  Referring Provider Dr. Shirlee Latch      Initial Encounter Date:  Flowsheet Row CARDIAC REHAB PHASE II ORIENTATION from 01/05/2021 in [place] CARDIAC REHABILITATION  Date 01/05/21      Visit Diagnosis: Chronic systolic congestive heart failure (HCC)  Patient's Home Medications on Admission:  Current Outpatient Medications:  .  acetaminophen (TYLENOL) 500 MG tablet, Take 1,000 mg by mouth daily as needed for mild pain or headache. , Disp: , Rfl:  .  ENTRESTO 97-103 MG, TAKE ONE TABLET BY MOUTH TWICE DAILY., Disp: 60 tablet, Rfl: 6 .  furosemide (LASIX) 40 MG tablet, Take 1 tablet (40 mg total) by mouth every morning AND 0.5 tablets (20 mg total) every evening., Disp: 135 tablet, Rfl: 3 .  metoprolol succinate (TOPROL-XL) 100 MG 24 hr tablet, TAKE (1) TABLET BY MOUTH TWICE DAILY. TAKE WITH OR IMMEDIATELY FOLLOWING A MEAL., Disp: 60 tablet, Rfl: 6 .  spironolactone (ALDACTONE) 25 MG tablet, Take 1 tablet (25 mg total) by mouth daily., Disp: 90 tablet, Rfl: 3 .  XARELTO 20 MG TABS tablet, TAKE ONE TABLET BY MOUTH EVERY DAY WITH DINNER., Disp: 30 tablet, Rfl: 6  Past Medical History: Past Medical History:  Diagnosis Date  . AICD (automatic cardioverter/defibrillator) present   . CHF (congestive heart failure) (HCC)   . Chronic anticoagulation   . Dysrhythmia    AFib  . Morbid obesity (HCC)   . Non-ischemic cardiomyopathy (HCC)   . OSA on CPAP    "have mask; can't tolerate mask" (04/23/2015)  . PAF (paroxysmal atrial fibrillation) (HCC)    RFA at [facility]+ dofetilide  . PONV (postoperative nausea and vomiting)    nausea  . Ventricular tachycardia (HCC)   . Wears glasses     Tobacco Use: Social History   Tobacco Use  Smoking Status Never Smoker   Smokeless Tobacco Never Used    Labs: Recent Review Flowsheet Data    Labs for ITP Cardiac and Pulmonary Rehab Latest Ref Rng & Units 01/04/2012 11/12/2014 11/12/2014 10/06/2015 03/29/2018   Cholestrol 0 - 200 192 - - 193 174   LDLCALC - 133(H) - - 129(H) 114   HDL >40 mg/dL 46 - - 48 -   Trlycerides 40 - 160 66 - - 81 82   Hemoglobin A1c 4.8 - 5.6 % - - - 5.4 -   PHART 7.350 - 7.450 - - 7.361 - -   PCO2ART 35.0 - 45.0 mmHg - - 40.6 - -   HCO3 20.0 - 24.0 mEq/L - 23.6 23.0 - -   TCO2 0 - 100 mmol/L - 25 24 - -   ACIDBASEDEF 0.0 - 2.0 mmol/L - 2.0 2.0 - -   O2SAT % - 69.0 98.0 - -      Capillary Blood Glucose: No results found for: GLUCAP   Exercise Target Goals: Exercise Program Goal: Individual exercise prescription set using results from initial 6 min walk test and THRR while considering  patient's activity barriers and safety.   Exercise Prescription Goal: Starting with aerobic activity 30 plus minutes a day, 3 days per week for initial exercise prescription. Provide home exercise prescription and guidelines that participant acknowledges understanding prior to discharge.  Activity Barriers & Risk Stratification:   6 Minute Walk:  6 Minute Walk  Row Name 01/05/21 1355         6 Minute Walk   Phase Initial     Distance 1400 feet     Walk Time 6 minutes     # of Rest Breaks 0     MPH 2.7     METS 3.07     RPE 12     VO2 Peak 10.74     Symptoms No     Resting HR 72 bpm     Resting BP 90/70     Resting Oxygen Saturation  96 %     Exercise Oxygen Saturation  during 6 min walk 94 %     Max Ex. HR 116 bpm     Max Ex. BP 110/72     2 Minute Post BP 86/70            Oxygen Initial Assessment:   Oxygen Re-Evaluation:   Oxygen Discharge (Final Oxygen Re-Evaluation):   Initial Exercise Prescription:  Initial Exercise Prescription - 01/05/21 1300      Date of Initial Exercise RX and Referring Provider   Date 01/05/21    Referring Provider Dr. Shirlee Latch     Expected Discharge Date 04/01/21      Treadmill   MPH 1.5    Grade 0    Minutes 22      NuStep   Level 1    SPM 80    Minutes 17      Prescription Details   Frequency (times per week) 3    Duration Progress to 30 minutes of continuous aerobic without signs/symptoms of physical distress      Intensity   THRR 40-80% of Max Heartrate 67-134    Ratings of Perceived Exertion 11-13      Progression   Progression Continue to progress workloads to maintain intensity without signs/symptoms of physical distress.      Resistance Training   Training Prescription Yes    Weight 4    Reps 10-15           Perform Capillary Blood Glucose checks as needed.  Exercise Prescription Changes:   Exercise Prescription Changes    Row Name 01/12/21 0900 01/19/21 0900 01/26/21 1000 02/09/21 1000 02/23/21 1300     Response to Exercise   Blood Pressure (Admit) 100/72 -- 92/64 98/65 96/62    Blood Pressure (Exercise) 104/74 -- 106/66 100/62 100/60   Blood Pressure (Exit) 92/70 -- 84/60 84/58 88/62    Heart Rate (Admit) 89 bpm -- 90 bpm 76 bpm 79 bpm   Heart Rate (Exercise) 117 bpm -- 119 bpm 120 bpm 120 bpm   Heart Rate (Exit) 90 bpm -- 99 bpm 85 bpm 88 bpm   Rating of Perceived Exertion (Exercise) 13 -- 12 12 12    Duration Continue with 30 min of aerobic exercise without signs/symptoms of physical distress. -- Continue with 30 min of aerobic exercise without signs/symptoms of physical distress. Continue with 30 min of aerobic exercise without signs/symptoms of physical distress. Continue with 30 min of aerobic exercise without signs/symptoms of physical distress.   Intensity THRR unchanged -- THRR unchanged THRR unchanged THRR unchanged     Progression   Progression Continue to progress workloads to maintain intensity without signs/symptoms of physical distress. -- Continue to progress workloads to maintain intensity without signs/symptoms of physical distress. Continue to progress workloads to  maintain intensity without signs/symptoms of physical distress. Continue to progress workloads to maintain intensity without signs/symptoms of physical distress.  Resistance Training   Training Prescription Yes -- Yes Yes Yes   Weight 5 -- 5 5 lbs 5 lbs   Reps 10-15 -- 10-15 10-15 10-15   Time 10 Minutes -- 10 Minutes 10 Minutes 10 Minutes     Treadmill   MPH 1.8 -- 2.2 2.2 2.4   Grade 0 -- 0 0 0   Minutes 22 -- 22 22 22    METs 2.38 -- 2.68 2.68 2.84     NuStep   Level 1 -- 2 3 4    SPM 78 -- 102 111 112   Minutes 17 -- 17 17 17    METs 1.9 -- 2.1 2.6 2.7     Home Exercise Plan   Plans to continue exercise at -- Home (comment) -- -- --   Frequency -- Add 3 additional days to program exercise sessions. -- -- --   Initial Home Exercises Provided -- 01/19/21 -- -- --   Row Name 03/09/21 1200 03/18/21 1011           Response to Exercise   Blood Pressure (Admit) 92/64 88/62      Blood Pressure (Exercise) 104/64 92/48      Blood Pressure (Exit) 88/60 84/60      Heart Rate (Admit) 75 bpm 89 bpm      Heart Rate (Exercise) 120 bpm 120 bpm      Heart Rate (Exit) 81 bpm 112 bpm      Rating of Perceived Exertion (Exercise) 12 12      Duration Continue with 30 min of aerobic exercise without signs/symptoms of physical distress. Continue with 30 min of aerobic exercise without signs/symptoms of physical distress.      Intensity THRR unchanged THRR unchanged             Progression   Progression Continue to progress workloads to maintain intensity without signs/symptoms of physical distress. Continue to progress workloads to maintain intensity without signs/symptoms of physical distress.             Resistance Training   Training Prescription Yes Yes      Weight 5 lbs 8 lbs      Reps 10-15 10-15      Time 10 Minutes 10 Minutes             Treadmill   MPH 2.6 2.8      Grade 0 0      Minutes 22 22      METs 2.99 3.14             NuStep   Level 4 4      SPM 117 119       Minutes 17 17      METs 3.08 3.17             Exercise Comments:   Exercise Comments    Row Name 01/12/21 03/20/21 01/19/21 0909         Exercise Comments Patient completed first exercise session today. He tolerated exercise well with no complaints. He looks forward to coming back to rehab. Reviewed home exercise program with patient today. He is currently walking at home 3 days per week for 30 minutes per day. He will continue to walk at home and we will continue to progress home exercise program.             Exercise Goals and Review:   Exercise Goals    Row Name 01/05/21 1358 01/26/21 1035 02/23/21 1341 03/24/21 1017  Exercise Goals   Increase Physical Activity Yes Yes Yes Yes    Intervention Develop an individualized exercise prescription for aerobic and resistive training based on initial evaluation findings, risk stratification, comorbidities and participant's personal goals.;Provide advice, education, support and counseling about physical activity/exercise needs. Develop an individualized exercise prescription for aerobic and resistive training based on initial evaluation findings, risk stratification, comorbidities and participant's personal goals.;Provide advice, education, support and counseling about physical activity/exercise needs. Develop an individualized exercise prescription for aerobic and resistive training based on initial evaluation findings, risk stratification, comorbidities and participant's personal goals.;Provide advice, education, support and counseling about physical activity/exercise needs. Develop an individualized exercise prescription for aerobic and resistive training based on initial evaluation findings, risk stratification, comorbidities and participant's personal goals.;Provide advice, education, support and counseling about physical activity/exercise needs.    Expected Outcomes Short Term: Attend rehab on a regular basis to increase amount of  physical activity.;Long Term: Add in home exercise to make exercise part of routine and to increase amount of physical activity.;Long Term: Exercising regularly at least 3-5 days a week. Short Term: Attend rehab on a regular basis to increase amount of physical activity.;Long Term: Add in home exercise to make exercise part of routine and to increase amount of physical activity.;Long Term: Exercising regularly at least 3-5 days a week. Short Term: Attend rehab on a regular basis to increase amount of physical activity.;Long Term: Add in home exercise to make exercise part of routine and to increase amount of physical activity.;Long Term: Exercising regularly at least 3-5 days a week. Short Term: Attend rehab on a regular basis to increase amount of physical activity.;Long Term: Add in home exercise to make exercise part of routine and to increase amount of physical activity.;Long Term: Exercising regularly at least 3-5 days a week.    Increase Strength and Stamina Yes Yes Yes Yes    Intervention Provide advice, education, support and counseling about physical activity/exercise needs.;Develop an individualized exercise prescription for aerobic and resistive training based on initial evaluation findings, risk stratification, comorbidities and participant's personal goals. Provide advice, education, support and counseling about physical activity/exercise needs.;Develop an individualized exercise prescription for aerobic and resistive training based on initial evaluation findings, risk stratification, comorbidities and participant's personal goals. Provide advice, education, support and counseling about physical activity/exercise needs.;Develop an individualized exercise prescription for aerobic and resistive training based on initial evaluation findings, risk stratification, comorbidities and participant's personal goals. Provide advice, education, support and counseling about physical activity/exercise  needs.;Develop an individualized exercise prescription for aerobic and resistive training based on initial evaluation findings, risk stratification, comorbidities and participant's personal goals.    Expected Outcomes Short Term: Increase workloads from initial exercise prescription for resistance, speed, and METs.;Short Term: Perform resistance training exercises routinely during rehab and add in resistance training at home;Long Term: Improve cardiorespiratory fitness, muscular endurance and strength as measured by increased METs and functional capacity ( ) Short Term: Increase workloads from initial exercise prescription for resistance, speed, and METs.;Short Term: Perform resistance training exercises routinely during rehab and add in resistance training at home;Long Term: Improve cardiorespiratory fitness, muscular endurance and strength as measured by increased METs and functional capacity ( ) Short Term: Increase workloads from initial exercise prescription for resistance, speed, and METs.;Short Term: Perform resistance training exercises routinely during rehab and add in resistance training at home;Long Term: Improve cardiorespiratory fitness, muscular endurance and strength as measured by increased METs and functional capacity ( ) Short Term: Increase workloads from initial exercise prescription for resistance, speed,  and METs.;Short Term: Perform resistance training exercises routinely during rehab and add in resistance training at home;Long Term: Improve cardiorespiratory fitness, muscular endurance and strength as measured by increased METs and functional capacity ( )    Able to understand and use rate of perceived exertion (RPE) scale Yes Yes Yes Yes    Intervention Provide education and explanation on how to use RPE scale Provide education and explanation on how to use RPE scale Provide education and explanation on how to use RPE scale Provide education and explanation on how to use RPE  scale    Expected Outcomes Short Term: Able to use RPE daily in rehab to express subjective intensity level;Long Term:  Able to use RPE to guide intensity level when exercising independently Short Term: Able to use RPE daily in rehab to express subjective intensity level;Long Term:  Able to use RPE to guide intensity level when exercising independently Short Term: Able to use RPE daily in rehab to express subjective intensity level;Long Term:  Able to use RPE to guide intensity level when exercising independently Short Term: Able to use RPE daily in rehab to express subjective intensity level;Long Term:  Able to use RPE to guide intensity level when exercising independently    Knowledge and understanding of Target Heart Rate Range (THRR) Yes Yes Yes Yes    Intervention Provide education and explanation of THRR including how the numbers were predicted and where they are located for reference Provide education and explanation of THRR including how the numbers were predicted and where they are located for reference Provide education and explanation of THRR including how the numbers were predicted and where they are located for reference Provide education and explanation of THRR including how the numbers were predicted and where they are located for reference    Expected Outcomes Short Term: Able to state/look up THRR;Long Term: Able to use THRR to govern intensity when exercising independently;Short Term: Able to use daily as guideline for intensity in rehab Short Term: Able to state/look up THRR;Long Term: Able to use THRR to govern intensity when exercising independently;Short Term: Able to use daily as guideline for intensity in rehab Short Term: Able to state/look up THRR;Long Term: Able to use THRR to govern intensity when exercising independently;Short Term: Able to use daily as guideline for intensity in rehab Short Term: Able to state/look up THRR;Long Term: Able to use THRR to govern intensity when  exercising independently;Short Term: Able to use daily as guideline for intensity in rehab    Able to check pulse independently Yes Yes Yes Yes    Intervention Provide education and demonstration on how to check pulse in carotid and radial arteries.;Review the importance of being able to check your own pulse for safety during independent exercise Provide education and demonstration on how to check pulse in carotid and radial arteries.;Review the importance of being able to check your own pulse for safety during independent exercise Provide education and demonstration on how to check pulse in carotid and radial arteries.;Review the importance of being able to check your own pulse for safety during independent exercise Provide education and demonstration on how to check pulse in carotid and radial arteries.;Review the importance of being able to check your own pulse for safety during independent exercise    Expected Outcomes Short Term: Able to explain why pulse checking is important during independent exercise;Long Term: Able to check pulse independently and accurately Short Term: Able to explain why pulse checking is important during independent exercise;Long Term:  Able to check pulse independently and accurately Short Term: Able to explain why pulse checking is important during independent exercise;Long Term: Able to check pulse independently and accurately Short Term: Able to explain why pulse checking is important during independent exercise;Long Term: Able to check pulse independently and accurately    Understanding of Exercise Prescription Yes Yes Yes Yes    Intervention Provide education, explanation, and written materials on patient's individual exercise prescription Provide education, explanation, and written materials on patient's individual exercise prescription Provide education, explanation, and written materials on patient's individual exercise prescription Provide education, explanation, and  written materials on patient's individual exercise prescription    Expected Outcomes Short Term: Able to explain program exercise prescription;Long Term: Able to explain home exercise prescription to exercise independently Short Term: Able to explain program exercise prescription;Long Term: Able to explain home exercise prescription to exercise independently Short Term: Able to explain program exercise prescription;Long Term: Able to explain home exercise prescription to exercise independently Short Term: Able to explain program exercise prescription;Long Term: Able to explain home exercise prescription to exercise independently           Exercise Goals Re-Evaluation :  Exercise Goals Re-Evaluation    Row Name 01/26/21 1035 02/23/21 1342 03/24/21 1017         Exercise Goal Re-Evaluation   Exercise Goals Review Increase Physical Activity;Increase Strength and Stamina;Able to understand and use rate of perceived exertion (RPE) scale;Knowledge and understanding of Target Heart Rate Range (THRR);Able to check pulse independently;Understanding of Exercise Prescription Increase Physical Activity;Increase Strength and Stamina;Able to understand and use rate of perceived exertion (RPE) scale;Knowledge and understanding of Target Heart Rate Range (THRR);Able to check pulse independently;Understanding of Exercise Prescription Increase Physical Activity;Increase Strength and Stamina;Able to understand and use rate of perceived exertion (RPE) scale;Knowledge and understanding of Target Heart Rate Range (THRR);Able to check pulse independently;Understanding of Exercise Prescription     Comments Patient has completed 7 exercise sessions. He has tolerated exercise well so far and is increasing intensities already. He is currently exercising at home 3 days per week for 30 minutes per day outside of rehab. He walks at home outside. He is getting stronger and building up his stamina. He has progressed well so far and  will continue to progress. He is currently exercising at 2.68 METs on the treadmill. Will continue to monitor and progress as able. Pt has completed 19 sessions. He continues to increase his exercise intensities and continues to exercise at home by walking an additional 3 days per week. He is currently exercising at 2.84 METs on the TM and 2.7 on the stepper. Will continue to monitor and progress as able. Patient has completed 28 sessions. He continues to increase intensities and tolerate exercise well. He is progressing well on the treadmill. We discussed increasing the incline on the treadmill next session. He is continuing to walk at home and continuing to progress toward his goals. He has increased his weight to 8 lbs on the weight training. He is currently exercising at 3.17 MEts on the Nustep. Will continue to monitor and progress as able.     Expected Outcomes Through exercise at rehab and with a home exercise program, patient will achieve their goals. Through exercise at rehab and with a home exercise program, patient will achieve their goals. Through exercise at rehab and with a home exercise program, patient will achieve their goals.             Discharge Exercise Prescription (Final  Exercise Prescription Changes):  Exercise Prescription Changes - 03/18/21 1011      Response to Exercise   Blood Pressure (Admit) 88/62    Blood Pressure (Exercise) 92/48    Blood Pressure (Exit) 84/60    Heart Rate (Admit) 89 bpm    Heart Rate (Exercise) 120 bpm    Heart Rate (Exit) 112 bpm    Rating of Perceived Exertion (Exercise) 12    Duration Continue with 30 min of aerobic exercise without signs/symptoms of physical distress.    Intensity THRR unchanged      Progression   Progression Continue to progress workloads to maintain intensity without signs/symptoms of physical distress.      Resistance Training   Training Prescription Yes    Weight 8 lbs    Reps 10-15    Time 10 Minutes       Treadmill   MPH 2.8    Grade 0    Minutes 22    METs 3.14      NuStep   Level 4    SPM 119    Minutes 17    METs 3.17           Nutrition:  Target Goals: Understanding of nutrition guidelines, daily intake of sodium 1500mg , cholesterol 200mg , calories 30% from fat and 7% or less from saturated fats, daily to have 5 or more servings of fruits and vegetables.  Biometrics:  Pre Biometrics - 03/09/21 1206      Pre Biometrics   Weight 143.5 kg            Nutrition Therapy Plan and Nutrition Goals:  Nutrition Therapy & Goals - 01/19/21 1515      Personal Nutrition Goals   Comments We provide 2 heart healthy nutritional education sessions with handouts.      Intervention Plan   Intervention Nutrition handout(s) given to patient.           Nutrition Assessments:  Nutrition Assessments - 01/05/21 1402      MEDFICTS Scores   Pre Score 50          MEDIFICTS Score Key:  ?70 Need to make dietary changes   40-70 Heart Healthy Diet  ? 40 Therapeutic Level Cholesterol Diet   Picture Your Plate Scores:  <82 Unhealthy dietary pattern with much room for improvement.  41-50 Dietary pattern unlikely to meet recommendations for good health and room for improvement.  51-60 More healthful dietary pattern, with some room for improvement.   >60 Healthy dietary pattern, although there may be some specific behaviors that could be improved.    Nutrition Goals Re-Evaluation:   Nutrition Goals Discharge (Final Nutrition Goals Re-Evaluation):   Psychosocial: Target Goals: Acknowledge presence or absence of significant depression and/or stress, maximize coping skills, provide positive support system. Participant is able to verbalize types and ability to use techniques and skills needed for reducing stress and depression.  Initial Review & Psychosocial Screening:  Initial Psych Review & Screening - 01/05/21 1408      Initial Review   Current issues with None  Identified      Family Dynamics   Good Support System? Yes    Comments Patient names his wife as his support system. He has a very positive outlook regarding his future and is glad to be doing the program. Will continue to monitor.      Barriers   Psychosocial barriers to participate in program There are no identifiable barriers or psychosocial needs.  Screening Interventions   Interventions Encouraged to exercise    Expected Outcomes Short Term goal: Utilizing psychosocial counselor, staff and physician to assist with identification of specific Stressors or current issues interfering with healing process. Setting desired goal for each stressor or current issue identified.;Long Term Goal: Stressors or current issues are controlled or eliminated.;Short Term goal: Identification and review with participant of any Quality of Life or Depression concerns found by scoring the questionnaire.           Quality of Life Scores:  Quality of Life - 01/05/21 1400      Quality of Life   [facility]      Quality of Life Scores   Health/Function Pre 20.13 %    Socioeconomic Pre 20.93 %    Psych/Spiritual Pre 21.79 %    Family Pre 22.5 %    GLOBAL Pre 20.94 %          Scores of 19 and below usually indicate a poorer quality of life in these areas.  A difference of  2-3 points is a clinically meaningful difference.  A difference of 2-3 points in the total score of the Quality of Life Index has been associated with significant improvement in overall quality of life, self-image, physical symptoms, and general health in studies assessing change in quality of life.  PHQ-9: Recent Review Flowsheet Data    Depression screen [facility] 2/9 01/05/2021 07/24/2019 09/10/2016 06/17/2016   Decreased Interest 0 0 0 0   Down, Depressed, Hopeless 0 0 0 0   PHQ - 2 Score 0 0 0 0   Altered sleeping 0 - 0 0   Tired, decreased energy 1 - 0 1   Change in appetite 0 - 0 0   Feeling bad or failure about  yourself  0 - 0 0   Trouble concentrating 0 - 0 0   Moving slowly or fidgety/restless 0 - 0 0   Suicidal thoughts 0 - 0 0   PHQ-9 Score 1 - 0 1   Difficult doing work/chores Not difficult at all - - Not difficult at all     Interpretation of Total Score  Total Score Depression Severity:  1-4 = Minimal depression, 5-9 = Mild depression, 10-14 = Moderate depression, 15-19 = Moderately severe depression, 20-27 = Severe depression   Psychosocial Evaluation and Intervention:  Psychosocial Evaluation - 01/05/21 1411      Psychosocial Evaluation & Interventions   Interventions Stress management education;Relaxation education;Encouraged to exercise with the program and follow exercise prescription    Comments Patient has no psychosocial issues identified at his orientation visit. His initial QOL score was 20.94 overall and his PHQ-9 score was 1. Will continue to monitor.    Expected Outcomes Patient will have no psychosocial issues identified at discharge.    Continue Psychosocial Services  No Follow up required           Psychosocial Re-Evaluation:  Psychosocial Re-Evaluation    Row Name 01/19/21 1515 02/17/21 1351 03/12/21 0737         Psychosocial Re-Evaluation   Current issues with None Identified None Identified None Identified     Comments Patient is new to the program completing 5 sessions. He continues to have no psychosocial barriers identified. He is hoping to get into the research study with Dr. Shirlee Latch to insert device to improve his left ventricular function. He has a positive outlook and is very interested in improving his health. Will continue to monitor. Patient  has completed 16 sessions and continues to have no psychosocial barriers identified. He was not chosen for the research project but is still hopeful that the device will be avaliable soon for everyone. He has a positive outlook and is very interested in improving his health. Will continue to monitor. Patient has  completed 26 sessions and continues to have no psychosocial barriers identified.  He is working on building a new house and continues to have a positive outlook and is very interested in improving his health. Will continue to monitor.     Expected Outcomes Patient will have no psychosocial issues identified throughout the program or at disharge. Patient will have no psychosocial issues identified throughout the program or at [facility]. Patient will have no psychosocial issues identified throughout the program or at [facility].     Interventions Encouraged to attend Cardiac Rehabilitation for the exercise Encouraged to attend Cardiac Rehabilitation for the exercise Encouraged to attend Cardiac Rehabilitation for the exercise     Continue Psychosocial Services  No Follow up required No Follow up required No Follow up required            Psychosocial Discharge (Final Psychosocial Re-Evaluation):  Psychosocial Re-Evaluation - 03/12/21 0737      Psychosocial Re-Evaluation   Current issues with None Identified    Comments Patient has completed 26 sessions and continues to have no psychosocial barriers identified.  He is working on building a new house and continues to have a positive outlook and is very interested in improving his health. Will continue to monitor.    Expected Outcomes Patient will have no psychosocial issues identified throughout the program or at disharge.    Interventions Encouraged to attend Cardiac Rehabilitation for the exercise    Continue Psychosocial Services  No Follow up required           Vocational Rehabilitation: Provide vocational rehab assistance to qualifying candidates.   Vocational Rehab Evaluation & Intervention:  Vocational Rehab - 01/05/21 1405      Vocational Rehab Re-Evaulation   Comments Patient is disabled and does not plan to return to work. He does not need vocational rehab.           Education: Education Goals: Education classes will be  provided on a weekly basis, covering required topics. Participant will state understanding/return demonstration of topics presented.  Learning Barriers/Preferences:  Learning Barriers/Preferences - 01/05/21 1402      Learning Barriers/Preferences   Learning Barriers None    Learning Preferences Audio;Written Material;Skilled Demonstration           Education Topics: Hypertension, Hypertension Reduction -Define heart disease and high blood pressure. Discus how high blood pressure affects the body and ways to reduce high blood pressure. Flowsheet Row CARDIAC REHAB PHASE II EXERCISE from 03/25/2021 in [place] CARDIAC REHABILITATION  Date 01/28/21  Educator mk  Instruction Review Code 2- Demonstrated Understanding      Exercise and Your Heart -Discuss why it is important to exercise, the FITT principles of exercise, normal and abnormal responses to exercise, and how to exercise safely. Flowsheet Row CARDIAC REHAB PHASE II EXERCISE from 03/25/2021 in [place] CARDIAC REHABILITATION  Date 02/04/21  Educator mk  Instruction Review Code 2- Demonstrated Understanding      Angina -Discuss definition of angina, causes of angina, treatment of angina, and how to decrease risk of having angina. Flowsheet Row CARDIAC REHAB PHASE II EXERCISE from 03/25/2021 in [place] CARDIAC REHABILITATION  Date 02/11/21  Educator mk  Instruction  Review Code 2- Demonstrated Understanding      Cardiac Medications -Review what the following cardiac medications are used for, how they affect the body, and side effects that may occur when taking the medications.  Medications include Aspirin, Beta blockers, calcium channel blockers, ACE Inhibitors, angiotensin receptor blockers, diuretics, digoxin, and antihyperlipidemics. Flowsheet Row CARDIAC REHAB PHASE II EXERCISE from 03/25/2021 in [place] CARDIAC REHABILITATION  Date 02/18/21  Educator [facility]  Instruction Review Code 1- Verbalizes Understanding       Congestive Heart Failure -Discuss the definition of CHF, how to live with CHF, the signs and symptoms of CHF, and how keep track of weight and sodium intake. Flowsheet Row CARDIAC REHAB PHASE II EXERCISE from 03/25/2021 in [place] CARDIAC REHABILITATION  Date 02/25/21  Educator DF  Instruction Review Code 2- Demonstrated Understanding      Heart Disease and Intimacy -Discus the effect sexual activity has on the heart, how changes occur during intimacy as we age, and safety during sexual activity. Flowsheet Row CARDIAC REHAB PHASE II EXERCISE from 03/25/2021 in [place] CARDIAC REHABILITATION  Date 03/04/21  Educator mk  Instruction Review Code 2- Demonstrated Understanding      Smoking Cessation / COPD -Discuss different methods to quit smoking, the health benefits of quitting smoking, and the definition of COPD. Flowsheet Row CARDIAC REHAB PHASE II EXERCISE from 03/25/2021 in [place] CARDIAC REHABILITATION  Date 03/11/21  Educator mk  Instruction Review Code 2- Demonstrated Understanding      Nutrition I: Fats -Discuss the types of cholesterol, what cholesterol does to the heart, and how cholesterol levels can be controlled. Flowsheet Row CARDIAC REHAB PHASE II EXERCISE from 03/25/2021 in [place] CARDIAC REHABILITATION  Date 03/18/21  Educator mk  Instruction Review Code 2- Demonstrated Understanding      Nutrition II: Labels -Discuss the different components of food labels and how to read food label Flowsheet Row CARDIAC REHAB PHASE II EXERCISE from 03/25/2021 in [place] CARDIAC REHABILITATION  Date 03/25/21  Educator mk  Instruction Review Code 2- Demonstrated Understanding      Heart Parts/Heart Disease and PAD -Discuss the anatomy of the heart, the pathway of blood circulation through the heart, and these are affected by heart disease. Flowsheet Row CARDIAC REHAB PHASE II EXERCISE from 09/08/2016 in [place] CARDIAC REHABILITATION  Date 07/28/16   Educator DC  Instruction Review Code 2- meets goals/outcomes      Stress I: Signs and Symptoms -Discuss the causes of stress, how stress may lead to anxiety and depression, and ways to limit stress. Flowsheet Row CARDIAC REHAB PHASE II EXERCISE from 09/08/2016 in [place] CARDIAC REHABILITATION  Date 08/04/16  Educator [place]  Instruction Review Code 2- meets goals/outcomes      Stress II: Relaxation -Discuss different types of relaxation techniques to limit stress. Flowsheet Row CARDIAC REHAB PHASE II EXERCISE from 03/25/2021 in [place] CARDIAC REHABILITATION  Date 01/14/21  Educator mk  Instruction Review Code 2- Demonstrated Understanding      Warning Signs of Stroke / TIA -Discuss definition of a stroke, what the signs and symptoms are of a stroke, and how to identify when someone is having stroke. Flowsheet Row CARDIAC REHAB PHASE II EXERCISE from 03/25/2021 in [place] CARDIAC REHABILITATION  Date 01/21/21  Educator mk  Instruction Review Code 2- Demonstrated Understanding      Knowledge Questionnaire Score:  Knowledge Questionnaire Score - 01/05/21 1404      Knowledge Questionnaire Score   Pre  Score 22/24           Core Components/Risk Factors/Patient Goals at Admission:  Personal Goals and Risk Factors at Admission - 01/05/21 1405      Core Components/Risk Factors/Patient Goals on Admission    Weight Management Obesity;Weight Loss;Yes    Admit Weight 311 lb (141.1 kg)    Goal Weight: Short Term 306 lb (138.8 kg)    Goal Weight: Long Term 301 lb (136.5 kg)    Heart Failure Yes    Intervention Provide a combined exercise and nutrition program that is supplemented with education, support and counseling about heart failure. Directed toward relieving symptoms such as shortness of breath, decreased exercise tolerance, and extremity edema.    Expected Outcomes Improve functional capacity of life;Short term: Attendance in program 2-3 days a week with  increased exercise capacity. Reported lower sodium intake. Reported increased fruit and vegetable intake. Reports medication compliance.;Short term: Daily weights obtained and reported for increase. Utilizing diuretic protocols set by physician.;Long term: Adoption of self-care skills and reduction of barriers for early signs and symptoms recognition and intervention leading to self-care maintenance.    Personal Goal Other Yes    Personal Goal Lose 10 to 15 lbs in the program and 60 to 70 lbs long term.    Intervention Provide cardiac rehab program supplemented with exercise and lifestyle changes at home.    Expected Outcomes Patient will complete the program meeting both personal and program goals.           Core Components/Risk Factors/Patient Goals Review:   Goals and Risk Factor Review    Row Name 01/19/21 1529 02/17/21 1352 03/12/21 0738         Core Components/Risk Factors/Patient Goals Review   Personal Goals Review Weight Management/Obesity Weight Management/Obesity Weight Management/Obesity     Review Patient is new to the program completing 5 sessions gaining 1 lb since his intial visit. He was referred to [facility] with chronic Systolic CHF. His personal goals for the program are to lose 10 to 15 lbs in the program and more weight longterm. He is trying to get into a research study with Dr. Shirlee LatchMcLean to insert a deivce to stimulate his vagel nerve to improve left ventricular function. He had an echocardiogarm and carotid artery study completed 3/23 for the study. Echo results have not been released yet and doppler study was normal. He will find out this week if he will be chosen for the study. We will continue to monitor his progress as he works toward meeting these goals. Patient has completed 16 sessions gaining 1 lb since last 30 day review. He continues to do well in the program with consistent attendance with progression. He was not chosen to get the device to improve EF for the research  study. He will still be in the study under the control group. He remains hopeful that the device will one day be avaliable. His blood pressure is well controlled.  His personal goals for the program are to lose 10 to 15 lbs in the program and lose 60 lbs long term. We will continue to monitor his progress as he works toward meeting these goals. Patient has completed 26 sessions gaining 1 lb since last 30 day review. He continues to do very well in the program with progressions and very consistent attendance. He has an echocardiogram 5/2 with EF <20%. His last echo was in March with an EF of 20%. He has a follow up appointment with cardilogy 6/6.  He reported to cardiology that he felt good. His blood pressures are low in our sessions but he remains asymptomatic. His personal goals for the program are to lose 10 to 15 lbs and lose 50-60 long term. We will continue to monitor his progress as he works toward meeting these goals.     Expected Outcomes Patient will complete the program meeting both personal and program goals. Patient will complete the program meeting both personal and program goals. Patient will complete the program meeting both personal and program goals.            Core Components/Risk Factors/Patient Goals at Discharge (Final Review):   Goals and Risk Factor Review - 03/12/21 0738      Core Components/Risk Factors/Patient Goals Review   Personal Goals Review Weight Management/Obesity    Review Patient has completed 26 sessions gaining 1 lb since last 30 day review. He continues to do very well in the program with progressions and very consistent attendance. He has an echocardiogram 5/2 with EF <20%. His last echo was in March with an EF of 20%. He has a follow up appointment with cardilogy 6/6. He reported to cardiology that he felt good. His blood pressures are low in our sessions but he remains asymptomatic. His personal goals for the program are to lose 10 to 15 lbs and lose 50-60 long  term. We will continue to monitor his progress as he works toward meeting these goals.    Expected Outcomes Patient will complete the program meeting both personal and program goals.           ITP Comments:   Comments: ITP REVIEW Pt is making expected progress toward Cardiac Rehab goals after completing 30 sessions. Recommend continued exercise, life style modification, education, and increased stamina and strength.

## 2021-03-27 ENCOUNTER — Other Ambulatory Visit: Payer: Self-pay

## 2021-03-27 ENCOUNTER — Encounter (HOSPITAL_COMMUNITY)
Admission: RE | Admit: 2021-03-27 | Discharge: 2021-03-27 | Disposition: A | Discharge status: Home / Self Care | Payer: Medicare Other | Source: Ambulatory Visit | Attending: Cardiology | Admitting: Cardiology

## 2021-03-27 DIAGNOSIS — I5022 Chronic systolic (congestive) heart failure: Secondary | ICD-10-CM

## 2021-03-27 NOTE — Progress Notes (Signed)
Daily Session Note  Patient Details  Name: Brandon Torres MRN: [id number] Date of Birth: 08-27-1966 Referring Provider:   Flowsheet Row CARDIAC REHAB PHASE II ORIENTATION from 01/05/2021 in [place]  Referring Provider Dr. Aundra Dubin      Encounter Date: 03/27/2021  Check In:  Session Check In - 03/27/21 0815      Check-In   Supervising physician immediately available to respond to emergencies [facility] MD immediately available    Physician(s) Dr. Domenic Polite    Location [facility]    Staff Present Geanie Cooley, RN;Dalton Fletcher, MS, ACSM-CEP, Exercise Physiologist    Virtual Visit No    Medication changes reported     No    Fall or balance concerns reported    No    Tobacco Cessation No Change    Warm-up and Cool-down Performed as group-led instruction    Resistance Training Performed Yes    VAD Patient? No      Pain Assessment   Currently in Pain? No/denies    Pain Score 0-No pain    Multiple Pain Sites No           Capillary Blood Glucose: No results found for this or any previous visit (from the past 24 hour(s)).    Social History   Tobacco Use  Smoking Status Never Smoker  Smokeless Tobacco Never Used    Goals Met:  Independence with exercise equipment Exercise tolerated well No report of cardiac concerns or symptoms Strength training completed today  Goals Unmet:  Not Applicable  Comments: check out @ 9:15   Dr. Kathie Dike is Medical Director for [facility] Pulmonary Rehab.

## 2021-03-30 ENCOUNTER — Encounter (HOSPITAL_COMMUNITY)
Admission: RE | Admit: 2021-03-30 | Discharge: 2021-03-30 | Disposition: A | Discharge status: Home / Self Care | Payer: Medicare Other | Source: Ambulatory Visit | Attending: Cardiology | Admitting: Cardiology

## 2021-03-30 ENCOUNTER — Other Ambulatory Visit: Payer: Self-pay

## 2021-03-30 ENCOUNTER — Ambulatory Visit (HOSPITAL_COMMUNITY)
Admission: RE | Admit: 2021-03-30 | Discharge: 2021-03-30 | Disposition: A | Discharge status: Home / Self Care | Payer: Medicare Other | Source: Ambulatory Visit | Attending: Cardiology | Admitting: Cardiology

## 2021-03-30 VITALS — BP 72/46 | HR 73 | Ht 72.0 in | Wt 314.0 lb

## 2021-03-30 DIAGNOSIS — I428 Other cardiomyopathies: Secondary | ICD-10-CM | POA: Insufficient documentation

## 2021-03-30 DIAGNOSIS — I482 Chronic atrial fibrillation, unspecified: Secondary | ICD-10-CM | POA: Diagnosis not present

## 2021-03-30 DIAGNOSIS — Z79899 Other long term (current) drug therapy: Secondary | ICD-10-CM | POA: Diagnosis not present

## 2021-03-30 DIAGNOSIS — R42 Dizziness and giddiness: Secondary | ICD-10-CM | POA: Diagnosis not present

## 2021-03-30 DIAGNOSIS — I5022 Chronic systolic (congestive) heart failure: Secondary | ICD-10-CM | POA: Diagnosis not present

## 2021-03-30 DIAGNOSIS — Z950 Presence of cardiac pacemaker: Secondary | ICD-10-CM | POA: Diagnosis not present

## 2021-03-30 DIAGNOSIS — Z7901 Long term (current) use of anticoagulants: Secondary | ICD-10-CM | POA: Diagnosis not present

## 2021-03-30 DIAGNOSIS — I5040 Unspecified combined systolic (congestive) and diastolic (congestive) heart failure: Secondary | ICD-10-CM | POA: Diagnosis not present

## 2021-03-30 LAB — BASIC METABOLIC PANEL
Anion gap: 10 (ref 5–15)
BUN: 28 mg/dL — ABNORMAL HIGH (ref 6–20)
CO2: 26 mmol/L (ref 22–32)
Calcium: 10.3 mg/dL (ref 8.9–10.3)
Chloride: 101 mmol/L (ref 98–111)
Creatinine, Ser: 1.23 mg/dL (ref 0.61–1.24)
GFR, Estimated: >60 mL/min (ref >60)
Glucose, Bld: 99 mg/dL (ref 70–99)
Potassium: 4.5 mmol/L (ref 3.5–5.1)
Sodium: 137 mmol/L (ref 135–145)

## 2021-03-30 MED ORDER — FUROSEMIDE 40 MG PO TABS
40.0000 mg | ORAL_TABLET | Freq: Two times a day (BID) | ORAL | 3 refills | Status: DC
Start: 2021-03-30 — End: 2021-11-05

## 2021-03-30 NOTE — Progress Notes (Signed)
PCP: [name], MD Cardiology: Dr. Wyline Mood HF Cardiology: Dr. Shirlee Latch  55 y.o. with history of nonischemic cardiomyopathy and chronic atrial fibrillation was referred by Dr. Wyline Mood for CHF evaluation.  Patient was diagnosed with CHF about 10 years ago.  He has had persistently low EF.  Most recent echo in 2/19 showed EF 20-25%.  Cath in 1/16 showed no significant CAD.  He has a Secondary school teacher CRT-D device.  He has been in chronic atrial fibrillation for years now.  He had ablation x 2 with breakthrough AF, also failed Tikosyn and amiodarone. 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  No ETOH or smoking.    Echo (12/21) showed EF 20-25%, severe LV dilation, mild LVH, moderate RV enlargement with moderately decreased systolic function.  Echo in 5/22 showed EF < 20%, severe LV dilation, mild RV enlargement with normal RV function, mild-moderate MR, dilated IVC.   Patient was enrolled in ANTHEM trial but is in the usual treatment arm so did not get a device.   Patient returns for followup of CHF.  BP low in the office today but he denies lightheadedness.  SBP in 90s-100s generally at cardiac rehab.  Cardiac rehab is helping.  Weight is down 3 lbs, he walks 3 miles on the treadmill without dyspnea.  No problems walking up stairs. No orthopnea/PND.      Labs (6/21): TSH normal, hgb 13.6, creatinine 0.97, LDL 97 Labs (3/22): K 5.1, creatinine 1.14  ECG (personally reviewed): atrial fibrillation, BiV pacing  PMH: 1. Atrial fibrillation: Chronic. He has had ablation x 2 with breakthrough atrial fibrillation. He failed Tikosyn and amiodarone.  2. H/o VT 3. OSA: Tries to use CPAP.  4. Obesity 5. Chronic systolic CHF: Nonischemic cardiomyopathy. St Jude CRT-D device.  - Echo (2013): EF 40%.  - Echo (1/16): EF 15-20%.  - LHC (1/16): Normal coronaries.  - Echo (2/19): EF 20-25% - Echo (12/21): EF 20-25%, severe LV dilation, mild LVH, moderate RV enlargement with moderately  decreased systolic function.  - CPX (12/21): RER 1.18, VE/VCO2 slope 29, peak VO2 13.1.  No significant HF limitation, primarily limited by deconditioning.  - Echo (5/22): EF < 20%, severe LV dilation, mild RV enlargement with normal RV function, mild-moderate MR, dilated IVC.  - ANTHEM trial in usual treatment arm.   FH: Grandfather died of MI at 82.  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   ROS: All systems reviewed and negative except as per HPI.   Current Outpatient Medications  Medication Sig Dispense Refill  . acetaminophen (TYLENOL) 500 MG tablet Take 1,000 mg by mouth daily as needed for mild pain or headache.     Marland Kitchen ENTRESTO 97-103 MG TAKE ONE TABLET BY MOUTH TWICE DAILY. 60 tablet 6  . metoprolol succinate (TOPROL-XL) 100 MG 24 hr tablet TAKE (1) TABLET BY MOUTH TWICE DAILY. TAKE WITH OR IMMEDIATELY FOLLOWING A MEAL. 60 tablet 6  . spironolactone (ALDACTONE) 25 MG tablet Take 1 tablet (25 mg total) by mouth daily. 90 tablet 3  . XARELTO 20 MG TABS tablet TAKE ONE TABLET BY MOUTH EVERY DAY WITH DINNER. 30 tablet 6  . furosemide (LASIX) 40 MG tablet Take 1 tablet (40 mg total) by mouth 2 (two) times daily. 180 tablet 3   No current facility-administered medications for this encounter.   BP (!) 72/46   Pulse 73   Ht 6' (1.829 m)   Wt (!) 142.4  kg (314 lb)   SpO2 98%   BMI 42.59 kg/m  General: NAD Neck: JVP 8 cm, no thyromegaly or thyroid nodule.  Lungs: Clear to auscultation bilaterally with normal respiratory effort. CV: Nondisplaced PMI.  Heart regular S1/S2, no S3/S4, no murmur.  1+ ankle edema.  No carotid bruit.  Normal pedal pulses.  Abdomen: Soft, nontender, no hepatosplenomegaly, no distention.  Skin: Intact without lesions or rashes.  Neurologic: Alert and oriented x 3.  Psych: Normal affect. Extremities: No clubbing or cyanosis.  HEENT: Normal.   Assessment/Plan: 1. Chronic systolic CHF: Nonischemic cardiomyopathy x  years.  St Jude BiV ICD.  Cath in 1/16 with no significant CAD.  Most recent echo in 12/21 with EF 20-25%.  CPX in 12/21 showed no definite HF limitation, primarily limited by deconditioning.  Cause of cardiomyopathy uncertain, ?viral myocarditis.  He does not have a strong family history of cardiomyopathy.  His pacemaker is not compatible with cardiac MRI.  He is in chronic atrial fibrillation but BiV pacing percentage has been > 95% chronically.  NYHA class II, mild volume overload by exam. BP is low but no lightheadedness.  SBP generally runs 90s-100s.    - Continue Toprol XL 100 mg bid.  - Continue Entresto 97/103 bid. - Continue spironolactone 25 mg daily.  - Unable to tolerate SGLT2 inhibitor due to yeast infections.  May try again after he has lost some weight.  - Increase Lasix to 60 qam/40 qpm x 3 days then 40 mg bid after that.  BMET today and in 10 days.  - In ANTHEM trial but did not randomize to the device.  - He has almost completed cardiac rehab.  - Repeat CPX now that he has done cardiac rehab.  2. Atrial fibrillation: Chronic, he has failed ablation x 2, Tikosyn, and amiodarone.   Plan for rate-control.  - Continue Toprol XL.  - Continue Xarelto 20 mg daily.   Followup in 1 month with NP/PA to reassess volume.   Marca Ancona 03/30/2021

## 2021-03-30 NOTE — Patient Instructions (Addendum)
Increase Furosemide to 60 mg (1 & 1/2 tabs) in AM and 40 mg (1 tab) in PM FOR 3 DAYS, THEN take 40 mg (1 tab) Twice daily   Your physician has recommended that you have a cardiopulmonary stress test (CPX). CPX testing is a non-invasive measurement of heart and lung function. It replaces a traditional treadmill stress test. This type of test provides a tremendous amount of information that relates not only to your present condition but also for future outcomes. This test combines measurements of you ventilation, respiratory gas exchange in the lungs, electrocardiogram (EKG), blood pressure and physical response before, during, and following an exercise protocol.  Labs done today, your results will be available in MyChart, we will contact you for abnormal readings.  Your physician recommends that you return for lab work in: 1-2 weeks, we have given you a prescription to have this done at [facility]  Your physician recommends that you schedule a follow-up appointment in: 1 month  If you have any questions or concerns before your next appointment please send [name] a message through [place] or call our office at [phone].    TO LEAVE A MESSAGE FOR THE NURSE SELECT OPTION 2, PLEASE LEAVE A MESSAGE INCLUDING: . YOUR NAME . DATE OF BIRTH . CALL BACK NUMBER . REASON FOR CALL**this is important as we prioritize the call backs  YOU WILL RECEIVE A CALL BACK THE SAME DAY AS LONG AS YOU CALL BEFORE 4:00 PM  At the [facility], you and your health needs are our priority. As part of our continuing mission to provide you with exceptional heart care, we have created designated Provider Care Teams. These Care Teams include your primary Cardiologist (physician) and Advanced Practice Providers (APPs- Physician Assistants and Nurse Practitioners) who all work together to provide you with the care you need, when you need it.   You may see any of the following providers on your designated Care Team at  your next follow up: Marland Kitchen Dr Arvilla Meres . Dr Marca Ancona . Dr Thornell Mule . Tonye Becket, NP . Robbie Lis, PA . Shanda Bumps [place],NP . Karle Plumber, PharmD   Please be sure to bring in all your medications bottles to every appointment.

## 2021-03-30 NOTE — Progress Notes (Signed)
Daily Session Note  Patient Details  Name: Brandon Torres MRN: [id number] Date of Birth: 01-20-1966 Referring Provider:   Flowsheet Row CARDIAC REHAB PHASE II ORIENTATION from 01/05/2021 in [place]  Referring Provider Dr. Aundra Dubin      Encounter Date: 03/30/2021  Check In:  Session Check In - 03/30/21 0815      Check-In   Supervising physician immediately available to respond to emergencies [facility] MD immediately available    Physician(s) Dr. Harrington Challenger    Location [facility]    Staff Present Cathren Harsh, MS, Exercise Physiologist;[place]ton Kris Mouton, MS, ACSM-CEP, Exercise Physiologist    Virtual Visit No    Medication changes reported     No    Fall or balance concerns reported    No    Tobacco Cessation No Change    Warm-up and Cool-down Performed as group-led instruction    Resistance Training Performed Yes    VAD Patient? No    PAD/SET Patient? No      Pain Assessment   Currently in Pain? No/denies    Pain Score 0-No pain    Multiple Pain Sites No           Capillary Blood Glucose: No results found for this or any previous visit (from the past 24 hour(s)).    Social History   Tobacco Use  Smoking Status Never Smoker  Smokeless Tobacco Never Used    Goals Met:  Independence with exercise equipment Exercise tolerated well No report of cardiac concerns or symptoms Strength training completed today  Goals Unmet:  Not Applicable  Comments: check out 0915   Dr. Kathie Dike is Medical Director for [facility].

## 2021-03-31 ENCOUNTER — Telehealth: Payer: Self-pay

## 2021-03-31 NOTE — Telephone Encounter (Signed)
Patient called in stating that he thinks he may have had an episode and wants to make sure that he didn't get shocked. He states that it might have had a muscle spasm. He says he feels fine and is sending a transmission and he understands a nurse will call back once we receive it as his monitor is hooked to a land line and couldn't stay on the phone while doing it

## 2021-03-31 NOTE — Telephone Encounter (Signed)
Patient reports he felt like he had a back spasm this morning around 7:30 am. No CP, no chest tightness, no SOB, no syncope or dizziness. Reviewed remote transmission : device function WNL, No episodes or treatment by device. Patient reassured that he was not treated by device.

## 2021-04-01 ENCOUNTER — Encounter (HOSPITAL_COMMUNITY)
Admission: RE | Admit: 2021-04-01 | Discharge: 2021-04-01 | Disposition: A | Discharge status: Home / Self Care | Payer: Medicare Other | Source: Ambulatory Visit | Attending: Cardiology | Admitting: Cardiology

## 2021-04-01 ENCOUNTER — Other Ambulatory Visit: Payer: Self-pay

## 2021-04-01 VITALS — Wt 310.2 lb

## 2021-04-01 DIAGNOSIS — I5022 Chronic systolic (congestive) heart failure: Secondary | ICD-10-CM | POA: Diagnosis not present

## 2021-04-01 NOTE — Progress Notes (Signed)
Daily Session Note  Patient Details  Name: Brandon Torres MRN: [id number] Date of Birth: 1966-07-10 Referring Provider:   Flowsheet Row CARDIAC REHAB PHASE II ORIENTATION from 01/05/2021 in [place]  Referring Provider Dr. Aundra Dubin      Encounter Date: 04/01/2021  Check In:  Session Check In - 04/01/21 0800      Check-In   Supervising physician immediately available to respond to emergencies [facility] MD immediately available    Physician(s) Dr. Harrington Challenger    Location [facility]    Staff Present Cathren Harsh, MS, Exercise Physiologist;Areesha Dehaven Kris Mouton, MS, ACSM-CEP, Exercise Physiologist    Virtual Visit No    Medication changes reported     No    Fall or balance concerns reported    No    Tobacco Cessation No Change    Warm-up and Cool-down Performed as group-led instruction    Resistance Training Performed Yes    VAD Patient? No    PAD/SET Patient? No      Pain Assessment   Currently in Pain? No/denies    Pain Score 0-No pain    Multiple Pain Sites No           Capillary Blood Glucose: No results found for this or any previous visit (from the past 24 hour(s)).    Social History   Tobacco Use  Smoking Status Never Smoker  Smokeless Tobacco Never Used    Goals Met:  Independence with exercise equipment Exercise tolerated well No report of cardiac concerns or symptoms Strength training completed today  Goals Unmet:  Not Applicable  Comments: checkout time is 0915   Dr. Kathie Dike is Medical Director for [facility] Pul[facility] Re[facility].

## 2021-04-03 ENCOUNTER — Encounter (HOSPITAL_COMMUNITY)
Admission: RE | Admit: 2021-04-03 | Discharge: 2021-04-03 | Disposition: A | Discharge status: Home / Self Care | Payer: Medicare Other | Source: Ambulatory Visit | Attending: Cardiology | Admitting: Cardiology

## 2021-04-03 ENCOUNTER — Other Ambulatory Visit: Payer: Self-pay

## 2021-04-03 DIAGNOSIS — I5022 Chronic systolic (congestive) heart failure: Secondary | ICD-10-CM

## 2021-04-03 DIAGNOSIS — Z006 Encounter for examination for normal comparison and control in clinical research program: Secondary | ICD-10-CM

## 2021-04-03 NOTE — Progress Notes (Signed)
Discharge Progress Report  Patient Details  Name: Brandon Torres MRN: [id number] Date of Birth: April 05, 1966 Referring Provider:   Flowsheet Row CARDIAC REHAB PHASE II ORIENTATION from 01/05/2021 in [place]  Referring Provider Dr. Aundra Dubin        Number of Visits: 34  Reason for Discharge:  Patient reached a stable level of exercise. Patient independent in their exercise. Patient has met program and personal goals.  Smoking History:  Social History   Tobacco Use  Smoking Status Never  Smokeless Tobacco Never    Diagnosis:  Chronic systolic congestive heart failure ([place])  ADL UCSD:   Initial Exercise Prescription:  Initial Exercise Prescription - 01/05/21 1300       Date of Initial Exercise RX and Referring Provider   Date 01/05/21    Referring Provider Dr. Aundra Dubin    Expected Discharge Date 04/01/21      Treadmill   MPH 1.5    Grade 0    Minutes 22      NuStep   Level 1    SPM 80    Minutes 17      Prescription Details   Frequency (times per week) 3    Duration Progress to 30 minutes of continuous aerobic without signs/symptoms of physical distress      Intensity   THRR 40-80% of Max Heartrate 67-134    Ratings of Perceived Exertion 11-13      Progression   Progression Continue to progress workloads to maintain intensity without signs/symptoms of physical distress.      Resistance Training   Training Prescription Yes    Weight 4    Reps 10-15             Discharge Exercise Prescription (Final Exercise Prescription Changes):  Exercise Prescription Changes - 03/18/21 1011       Response to Exercise   Blood Pressure (Admit) 88/62    Blood Pressure (Exercise) 92/48    Blood Pressure (Exit) 84/60    Heart Rate (Admit) 89 bpm    Heart Rate (Exercise) 120 bpm    Heart Rate (Exit) 112 bpm    Rating of Perceived Exertion (Exercise) 12    Duration Continue with 30 min of aerobic exercise without signs/symptoms of  physical distress.    Intensity THRR unchanged      Progression   Progression Continue to progress workloads to maintain intensity without signs/symptoms of physical distress.      Resistance Training   Training Prescription Yes    Weight 8 lbs    Reps 10-15    Time 10 Minutes      Treadmill   MPH 2.8    Grade 0    Minutes 22    METs 3.14      NuStep   Level 4    SPM 119    Minutes 17    METs 3.17             Functional Capacity:  6 Minute Walk     Row Name 01/05/21 1355 04/01/21 0839       6 Minute Walk   Phase Initial Discharge    Distance 1400 feet 1650 feet    Walk Time 6 minutes 6 minutes    # of Rest Breaks 0 0    MPH 2.7 3.1    METS 3.07 3.37    RPE 12 12    VO2 Peak 10.74 11.81    Symptoms No No    Resting  HR 72 bpm 69 bpm    Resting BP 90/70 96/58    Resting Oxygen Saturation  96 % 96 %    Exercise Oxygen Saturation  during 6 min walk 94 % 98 %    Max Ex. HR 116 bpm 115 bpm    Max Ex. BP 110/72 108/58    2 Minute Post BP 86/70 90/58             Psychological, QOL, Others - Outcomes: PHQ 2/9: Depression screen [facility] 2/9 04/01/2021 01/05/2021 07/24/2019 09/10/2016 06/17/2016  Decreased Interest 0 0 0 0 0  Down, Depressed, Hopeless 0 0 0 0 0  PHQ - 2 Score 0 0 0 0 0  Altered sleeping 0 0 - 0 0  Tired, decreased energy 1 1 - 0 1  Change in appetite 0 0 - 0 0  Feeling bad or failure about yourself  0 0 - 0 0  Trouble concentrating 0 0 - 0 0  Moving slowly or fidgety/restless 0 0 - 0 0  Suicidal thoughts 0 0 - 0 0  PHQ-9 Score 1 1 - 0 1  Difficult doing work/chores Not difficult at all Not difficult at all - - Not difficult at all  Some recent data might be hidden    Quality of Life:  Quality of Life - 04/01/21 0841       Quality of Life   Select Quality of Life      Quality of Life Scores   Health/Function Pre 20.13 %    Health/Function Post 21.4 %    Health/Function % Change 6.31 %    Socioeconomic Pre 20.93 %    Socioeconomic  Post 22.29 %    Socioeconomic % Change  6.5 %    Psych/Spiritual Pre 21.79 %    Psych/Spiritual Post 22.5 %    Psych/Spiritual % Change 3.26 %    Family Pre 22.5 %    Family Post 22.5 %    Family % Change 0 %    GLOBAL Pre 20.94 %    GLOBAL Post 21.95 %    GLOBAL % Change 4.82 %             Personal Goals: Goals established at orientation with interventions provided to work toward goal.  Personal Goals and Risk Factors at Admission - 01/05/21 1405       Core Components/Risk Factors/Patient Goals on Admission    Weight Management Obesity;Weight Loss;Yes    Admit Weight 311 lb (141.1 kg)    Goal Weight: Short Term 306 lb (138.8 kg)    Goal Weight: Long Term 301 lb (136.5 kg)    Heart Failure Yes    Intervention Provide a combined exercise and nutrition program that is supplemented with education, support and counseling about heart failure. Directed toward relieving symptoms such as shortness of breath, decreased exercise tolerance, and extremity edema.    Expected Outcomes Improve functional capacity of life;Short term: Attendance in program 2-3 days a week with increased exercise capacity. Reported lower sodium intake. Reported increased fruit and vegetable intake. Reports medication compliance.;Short term: Daily weights obtained and reported for increase. Utilizing diuretic protocols set by physician.;Long term: Adoption of self-care skills and reduction of barriers for early signs and symptoms recognition and intervention leading to self-care maintenance.    Personal Goal Other Yes    Personal Goal Lose 10 to 15 lbs in the program and 60 to 70 lbs long term.    Intervention Provide cardiac rehab program  supplemented with exercise and lifestyle changes at home.    Expected Outcomes Patient will complete the program meeting both personal and program goals.              Personal Goals Discharge:  Goals and Risk Factor Review     Row Name 01/19/21 1529 02/17/21 1352 03/12/21  0738 04/03/21 1325       Core Components/Risk Factors/Patient Goals Review   Personal Goals Review Weight Management/Obesity Weight Management/Obesity Weight Management/Obesity Weight Management/Obesity    Review Patient is new to the program completing 5 sessions gaining 1 lb since his intial visit. He was referred to CR with chronic Systolic CHF. His personal goals for the program are to lose 10 to 15 lbs in the program and more weight longterm. He is trying to get into a research study with Dr. Aundra Dubin to insert a deivce to stimulate his vagel nerve to improve left ventricular function. He had an echocardiogarm and carotid artery study completed 3/23 for the study. Echo results have not been released yet and doppler study was normal. He will find out this week if he will be chosen for the study. We will continue to monitor his progress as he works toward meeting these goals. Patient has completed 16 sessions gaining 1 lb since last 30 day review. He continues to do well in the program with consistent attendance with progression. He was not chosen to get the device to improve EF for the research study. He will still be in the study under the control group. He remains hopeful that the device will one day be avaliable. His blood pressure is well controlled.  His personal goals for the program are to lose 10 to 15 lbs in the program and lose 60 lbs long term. We will continue to monitor his progress as he works toward meeting these goals. Patient has completed 26 sessions gaining 1 lb since last 30 day review. He continues to do very well in the program with progressions and very consistent attendance. He has an echocardiogram 5/2 with EF <20%. His last echo was in March with an EF of 20%. He has a follow up appointment with [facility]ilogy 6/6. He reported to cardiology that he felt good. His blood pressures are low in our sessions but he remains asymptomatic. His personal goals for the program are to lose 10 to 15  lbs and lose 50-60 long term. We will continue to monitor his progress as he works toward meeting these goals. Patient has completed 34 sessions before discharge. He has lost close to 2 lbs since admission in the program. He has done very well with progressing exercise and has had very consistent attendance. He is going to continue to exercise at home to keep working towards his weight loss goal. His personal goal is to lose 10-15 lbs and long term lose 50-60lbs.    Expected Outcomes Patient will complete the program meeting both personal and program goals. Patient will complete the program meeting both personal and program goals. Patient will complete the program meeting both personal and program goals. Patient will complete the program meeting both personal and program goals.             Exercise Goals and Review:  Exercise Goals     Row Name 01/05/21 1358 01/26/21 1035 02/23/21 1341 03/24/21 1017       Exercise Goals   Increase Physical Activity Yes Yes Yes Yes    Intervention Develop an individualized exercise prescription for  aerobic and resistive training based on initial evaluation findings, risk stratification, comorbidities and participant's personal goals.;Provide advice, education, support and counseling about physical activity/exercise needs. Develop an individualized exercise prescription for aerobic and resistive training based on initial evaluation findings, risk stratification, comorbidities and participant's personal goals.;Provide advice, education, support and counseling about physical activity/exercise needs. Develop an individualized exercise prescription for aerobic and resistive training based on initial evaluation findings, risk stratification, comorbidities and participant's personal goals.;Provide advice, education, support and counseling about physical activity/exercise needs. Develop an individualized exercise prescription for aerobic and resistive training based on  initial evaluation findings, risk stratification, comorbidities and participant's personal goals.;Provide advice, education, support and counseling about physical activity/exercise needs.    Expected Outcomes Short Term: Attend rehab on a regular basis to increase amount of physical activity.;Long Term: Add in home exercise to make exercise part of routine and to increase amount of physical activity.;Long Term: Exercising regularly at least 3-5 days a week. Short Term: Attend rehab on a regular basis to increase amount of physical activity.;Long Term: Add in home exercise to make exercise part of routine and to increase amount of physical activity.;Long Term: Exercising regularly at least 3-5 days a week. Short Term: Attend rehab on a regular basis to increase amount of physical activity.;Long Term: Add in home exercise to make exercise part of routine and to increase amount of physical activity.;Long Term: Exercising regularly at least 3-5 days a week. Short Term: Attend rehab on a regular basis to increase amount of physical activity.;Long Term: Add in home exercise to make exercise part of routine and to increase amount of physical activity.;Long Term: Exercising regularly at least 3-5 days a week.    Increase Strength and Stamina Yes Yes Yes Yes    Intervention Provide advice, education, support and counseling about physical activity/exercise needs.;Develop an individualized exercise prescription for aerobic and resistive training based on initial evaluation findings, risk stratification, comorbidities and participant's personal goals. Provide advice, education, support and counseling about physical activity/exercise needs.;Develop an individualized exercise prescription for aerobic and resistive training based on initial evaluation findings, risk stratification, comorbidities and participant's personal goals. Provide advice, education, support and counseling about physical activity/exercise needs.;Develop an  individualized exercise prescription for aerobic and resistive training based on initial evaluation findings, risk stratification, comorbidities and participant's personal goals. Provide advice, education, support and counseling about physical activity/exercise needs.;Develop an individualized exercise prescription for aerobic and resistive training based on initial evaluation findings, risk stratification, comorbidities and participant's personal goals.    Expected Outcomes Short Term: Increase workloads from initial exercise prescription for resistance, speed, and METs.;Short Term: Perform resistance training exercises routinely during rehab and add in resistance training at home;Long Term: Improve cardiorespiratory fitness, muscular endurance and strength as measured by increased METs and functional capacity (6MWT) Short Term: Increase workloads from initial exercise prescription for resistance, speed, and METs.;Short Term: Perform resistance training exercises routinely during rehab and add in resistance training at home;Long Term: Improve cardiorespiratory fitness, muscular endurance and strength as measured by increased METs and functional capacity (6MWT) Short Term: Increase workloads from initial exercise prescription for resistance, speed, and METs.;Short Term: Perform resistance training exercises routinely during rehab and add in resistance training at home;Long Term: Improve cardiorespiratory fitness, muscular endurance and strength as measured by increased METs and functional capacity (6MWT) Short Term: Increase workloads from initial exercise prescription for resistance, speed, and METs.;Short Term: Perform resistance training exercises routinely during rehab and add in resistance training at home;Long Term: Improve cardiorespiratory fitness,  muscular endurance and strength as measured by increased METs and functional capacity (6MWT)    Able to understand and use rate of perceived exertion (RPE)  scale Yes Yes Yes Yes    Intervention Provide education and explanation on how to use RPE scale Provide education and explanation on how to use RPE scale Provide education and explanation on how to use RPE scale Provide education and explanation on how to use RPE scale    Expected Outcomes Short Term: Able to use RPE daily in rehab to express subjective intensity level;Long Term:  Able to use RPE to guide intensity level when exercising independently Short Term: Able to use RPE daily in rehab to express subjective intensity level;Long Term:  Able to use RPE to guide intensity level when exercising independently Short Term: Able to use RPE daily in rehab to express subjective intensity level;Long Term:  Able to use RPE to guide intensity level when exercising independently Short Term: Able to use RPE daily in rehab to express subjective intensity level;Long Term:  Able to use RPE to guide intensity level when exercising independently    Knowledge and understanding of Target Heart Rate Range (THRR) Yes Yes Yes Yes    Intervention Provide education and explanation of THRR including how the numbers were predicted and where they are located for reference Provide education and explanation of THRR including how the numbers were predicted and where they are located for reference Provide education and explanation of THRR including how the numbers were predicted and where they are located for reference Provide education and explanation of THRR including how the numbers were predicted and where they are located for reference    Expected Outcomes Short Term: Able to state/look up THRR;Long Term: Able to use THRR to govern intensity when exercising independently;Short Term: Able to use daily as guideline for intensity in rehab Short Term: Able to state/look up THRR;Long Term: Able to use THRR to govern intensity when exercising independently;Short Term: Able to use daily as guideline for intensity in rehab Short Term: Able  to state/look up THRR;Long Term: Able to use THRR to govern intensity when exercising independently;Short Term: Able to use daily as guideline for intensity in rehab Short Term: Able to state/look up THRR;Long Term: Able to use THRR to govern intensity when exercising independently;Short Term: Able to use daily as guideline for intensity in rehab    Able to check pulse independently Yes Yes Yes Yes    Intervention Provide education and demonstration on how to check pulse in carotid and radial arteries.;Review the importance of being able to check your own pulse for safety during independent exercise Provide education and demonstration on how to check pulse in carotid and radial arteries.;Review the importance of being able to check your own pulse for safety during independent exercise Provide education and demonstration on how to check pulse in carotid and radial arteries.;Review the importance of being able to check your own pulse for safety during independent exercise Provide education and demonstration on how to check pulse in carotid and radial arteries.;Review the importance of being able to check your own pulse for safety during independent exercise    Expected Outcomes Short Term: Able to explain why pulse checking is important during independent exercise;Long Term: Able to check pulse independently and accurately Short Term: Able to explain why pulse checking is important during independent exercise;Long Term: Able to check pulse independently and accurately Short Term: Able to explain why pulse checking is important during independent exercise;Long Term:  Able to check pulse independently and accurately Short Term: Able to explain why pulse checking is important during independent exercise;Long Term: Able to check pulse independently and accurately    Understanding of Exercise Prescription Yes Yes Yes Yes    Intervention Provide education, explanation, and written materials on patient's individual  exercise prescription Provide education, explanation, and written materials on patient's individual exercise prescription Provide education, explanation, and written materials on patient's individual exercise prescription Provide education, explanation, and written materials on patient's individual exercise prescription    Expected Outcomes Short Term: Able to explain program exercise prescription;Long Term: Able to explain home exercise prescription to exercise independently Short Term: Able to explain program exercise prescription;Long Term: Able to explain home exercise prescription to exercise independently Short Term: Able to explain program exercise prescription;Long Term: Able to explain home exercise prescription to exercise independently Short Term: Able to explain program exercise prescription;Long Term: Able to explain home exercise prescription to exercise independently             Exercise Goals Re-Evaluation:  Exercise Goals Re-Evaluation     Row Name 01/26/21 1035 02/23/21 1342 03/24/21 1017 04/03/21 1039       Exercise Goal Re-Evaluation   Exercise Goals Review Increase Physical Activity;Increase Strength and Stamina;Able to understand and use rate of perceived exertion (RPE) scale;Knowledge and understanding of Target Heart Rate Range (THRR);Able to check pulse independently;Understanding of Exercise Prescription Increase Physical Activity;Increase Strength and Stamina;Able to understand and use rate of perceived exertion (RPE) scale;Knowledge and understanding of Target Heart Rate Range (THRR);Able to check pulse independently;Understanding of Exercise Prescription Increase Physical Activity;Increase Strength and Stamina;Able to understand and use rate of perceived exertion (RPE) scale;Knowledge and understanding of Target Heart Rate Range (THRR);Able to check pulse independently;Understanding of Exercise Prescription Increase Physical Activity;Increase Strength and Stamina;Able to  understand and use rate of perceived exertion (RPE) scale;Knowledge and understanding of Target Heart Rate Range (THRR);Able to check pulse independently;Understanding of Exercise Prescription    Comments Patient has completed 7 exercise sessions. He has tolerated exercise well so far and is increasing intensities already. He is currently exercising at home 3 days per week for 30 minutes per day outside of rehab. He walks at home outside. He is getting stronger and building up his stamina. He has progressed well so far and will continue to progress. He is currently exercising at 2.68 METs on the treadmill. Will continue to monitor and progress as able. Pt has completed 19 sessions. He continues to increase his exercise intensities and continues to exercise at home by walking an additional 3 days per week. He is currently exercising at 2.84 METs on the TM and 2.7 on the stepper. Will continue to monitor and progress as able. Patient has completed 28 sessions. He continues to increase intensities and tolerate exercise well. He is progressing well on the treadmill. We discussed increasing the incline on the treadmill next session. He is continuing to walk at home and continuing to progress toward his goals. He has increased his weight to 8 lbs on the weight training. He is currently exercising at 3.17 MEts on the Nustep. Will continue to monitor and progress as able. patient has completed 34 sessions before discharge. Throughout the program he progressed well and was able to increase intensities frequently and tolerate exercise well. He continues to be active at home and continues to exercise at home as well. He had increased his weight on strength training and gotten stronger since entering the program. He ended  exercising in the program at 3.82 METs.    Expected Outcomes Through exercise at rehab and with a home exercise program, patient will achieve their goals. Through exercise at rehab and with a home exercise  program, patient will achieve their goals. Through exercise at rehab and with a home exercise program, patient will achieve their goals. Through exercise at rehab and with a home exercise program, patient will achieve their goals.             Nutrition & Weight - Outcomes:  Pre Biometrics - 04/01/21 0840       Pre Biometrics   Weight 140.7 kg    Waist Circumference 50.5 inches    Hip Circumference 50.75 inches    Waist to Hip Ratio 1 %    BMI (Calculated) 42.06    Triceps Skinfold 12 mm    % Body Fat 35.9 %    Grip Strength 51.5 kg    Flexibility 14 in    Single Leg Stand 49 seconds              Nutrition:  Nutrition Therapy & Goals - 01/19/21 1515       Personal Nutrition Goals   Comments We provide 2 heart healthy nutritional education sessions with handouts.      Intervention Plan   Intervention Nutrition handout(s) given to patient.             Nutrition Discharge:  Nutrition Assessments - 04/01/21 0842       MEDFICTS Scores   Pre Score 50    Post Score 21    Score Difference -29             Education Questionnaire Score:  Knowledge Questionnaire Score - 04/01/21 0841       Knowledge Questionnaire Score   Pre Score 22/24    Post Score 22/24            Patient graduated from Cardiac Rehabilitation today on 04-03-21 after completing 34 sessions. They achieved LTG of 30 minutes of aerobic exercise at Max Met level of 3.82. All patients vitals are WNL. Discharge instruction has been reviewed in detail and patient stated an understanding of material given. Patient plans to exercise at home. Cardiac Rehab staff will make f/u call. Patient had no complaints of any abnormal S/S or pain on their exit visit.    Goals reviewed with patient; copy given to patient.

## 2021-04-03 NOTE — Progress Notes (Signed)
Daily Session Note  Patient Details  Name: Brandon Torres MRN: [id number] Date of Birth: Jun 02, 1966 Referring Provider:   Flowsheet Row CARDIAC REHAB PHASE II ORIENTATION from 01/05/2021 in [place]  Referring Provider Dr. Aundra Dubin       Encounter Date: 04/03/2021  Check In:  Session Check In - 04/03/21 0815       Check-In   Supervising physician immediately available to respond to emergencies [facility] MD immediately available    Physician(s) Dr. Domenic Polite    Location [facility]    Staff Present Cathren Harsh, MS, Exercise Physiologist;Dalton Kris Mouton, MS, ACSM-CEP, Exercise Physiologist    Virtual Visit No    Medication changes reported     No    Fall or balance concerns reported    No    Tobacco Cessation No Change    Warm-up and Cool-down Performed as group-led instruction    Resistance Training Performed Yes    VAD Patient? No    PAD/SET Patient? No      Pain Assessment   Currently in Pain? No/denies    Pain Score 0-No pain    Multiple Pain Sites No             Capillary Blood Glucose: No results found for this or any previous visit (from the past 24 hour(s)).    Social History   Tobacco Use  Smoking Status Never  Smokeless Tobacco Never    Goals Met:  Independence with exercise equipment Exercise tolerated well No report of cardiac concerns or symptoms Strength training completed today  Goals Unmet:  Not Applicable  Comments: checkout 0915   Dr. Kathie Dike is Medical Director for [facility].

## 2021-04-08 NOTE — Research (Addendum)
[facility] HF Research Study  10 Week                                                                             Randomization # - [id number]         Week 10    Titration Equivalent Visit Visit Date  Visit Date   Visit Date (dd/MMM/yyyy):    04-03-2021     Titration Equivalent Visit (for control arm subjects)   Did the Titration Equivalent Visit Occur?  [x]  Yes (Complete Below) []  No (Complete Protocol Deviation)   Was the visit in person or a phone visit? []  In Person [x]  Phone   Is this the last Titration Equivalent Visit? [x]  Yes []  No      Has the subject been hospitalized or died since last visit?   []  Yes [x]  No   Did subject report any adverse events? []  Yes [x]  No    If yes, complete AE form.                                                                         04-03-2021 Printed Name       Date                                                  Health Status Questions  Randomization #  Health Status Questions  Were the questions asked? [x]  Yes []  No  Was the assessment collected on the same date as the Visit Date? [x]  Yes []  No  Date (dd/MMM/yyyy):  04-03-2021  Individual(s) questioned: (Check all that apply) [x]  Patient []  Caregiver/Carer []  Family member(s) []  Other individual(s) accompanying patient     Since your last visit here or the last time that we spoke with one another, have any of the following happened for the first time, or worsened, that we have not talked about previously? (Check all that apply) []  Coughing or wheezing []  Chest pain []  Difficulty concentrating or decreased alertness []  Fatigue and weakness []  Sensation of rapid or irregular heartbeat []  More difficulty getting your chores done or        taking care of yourself []  More shortness of breath []  Lightheadedness or fainting []  Shortness of breath when you are physically        active or when you lie down []  Swelling in your legs,  ankles or feet; fluid        retention;swelling around your stomach []  Water retention [x]  None of the above  Did you see a health care provider for any of these or other urgent reason []  Yes [x]  No  If Yes, where? []  Clinic or office []  Emergency room []  Urgent care []  Hospital (stayed one or more nights)     If  yes, what kind of health care provider did you see? []  Emergency or urgent care doctor []  Family doctor, general practitioner []  Cardiologist (or heart failure doctor) []  Nurse []  Pharmacist []  Other   Other, specify         Since your last visit here or the last time that we spoke with one another, have you had any Concomitant Medication Changes? [] Yes (Modify the appropriate Concomitant Medication form in the casebook) [x]  No   Since the last visit or the last time that you spoke with the subject, has the subject had any Adverse Event  []  Yes (Create AE/SAE form as needed) []  No    Since the last visit or the last time that you spoke with the subject, has the subject had any Adverse Device Events or Device Malfunctions? []  Yes (Create AE/SAE/Device Malfunction           form as needed) []  No [x]  Not applicable   Since your last visit here or the last time that we spoke with one another, have there been any other changes in how you (the patient) have been feeling, or how you (the patient) have been doing, or a health problem that we have not talked about? []  Yes [x]  No   If yes, list:

## 2021-04-09 ENCOUNTER — Other Ambulatory Visit: Payer: Self-pay

## 2021-04-09 ENCOUNTER — Ambulatory Visit (HOSPITAL_COMMUNITY)
Admission: RE | Admit: 2021-04-09 | Discharge: 2021-04-09 | Disposition: A | Discharge status: Home / Self Care | Payer: Medicare Other | Source: Ambulatory Visit | Attending: Cardiology | Admitting: Cardiology

## 2021-04-09 DIAGNOSIS — I5023 Acute on chronic systolic (congestive) heart failure: Secondary | ICD-10-CM

## 2021-04-09 DIAGNOSIS — Z006 Encounter for examination for normal comparison and control in clinical research program: Secondary | ICD-10-CM

## 2021-04-09 DIAGNOSIS — I5042 Chronic combined systolic (congestive) and diastolic (congestive) heart failure: Secondary | ICD-10-CM

## 2021-04-09 LAB — ECHOCARDIOGRAM COMPLETE
Calc EF: 33.4 %
MV M vel: 3.67 m/s
MV Peak grad: 53.9 mmHg
Radius: 0.2 cm
S' Lateral: 6.3 cm
Single Plane A2C EF: 32 %
Single Plane A4C EF: 34 %

## 2021-04-09 NOTE — Progress Notes (Signed)
  Echocardiogram 2D Echocardiogram has been performed.  Brandon Torres 04/09/2021, 8:56 AM

## 2021-04-09 NOTE — Research (Addendum)
Visits Date: 04/09/2021  Randomization #         [id number]                                        SCR# [id number]   Visit Date    04/09/21            Echo   Was the Echo acquired? _0  Yes (Complete Below) _1  No (Complete Protocol Deviation)   Was the assessment collected on the same date as the Visit Date?   _2  Yes _3  No     If No - Date of Echo (dd/MMM/yyyy):     Was the eligibility criterion met according to the core lab?   _4  Yes (Left Ventricular Ejection Fraction meets eligibility criteria of ? 35% and left ventricular end-diastolic diameter (LVEDD) < 8.0 cm)  _5  No (Left Ventricular Ejection Fraction does not meet eligibility criteria of ? 35% or  left ventricular end-diastolic diameter is not (LVEDD) < 8.0 cm,  .)    Send the Echo to the Core Lab as soon after acquisition as possible.            Vital Signs  Vital Signs  Were Vital Signs Collected?  _6 Yes (Complete Below)  _7 No (Complete Protocol Deviation)   Was the assessment collected on the same date as the Visit Date?  _8 Yes  _9 No   If no -Date of vital signs (dd/MMM/yyyy):    Systolic Blood Pressure (mmHg):    90   Diastolic Blood Pressure (mmHg):    60  Weight: 311     Units:  _10 Kilograms  _11 Pounds    Height: 72    Units:  _12 centimeters  _13 inches     Heart Rate (bpm): 70    Biomarkers   Were the blood samples drawn for the biomarkers? _14  Yes (Complete Below) _15  No (Complete Protocol Deviation)   Was the assessment collected on the same date as the Visit Date? _16  Yes _17  No   If no - Date blood samples drawn (dd/MMM/yyyy):      Were all of the biomarker samples shipped to the lab?   _18  Yes (Complete Below) _19  No (Complete Protocol Deviation)   Date blood samples shipped (dd/MMM/yyyy): 16/Jun/2022      Were all the samples viable for analysis:   _20  Yes _21  No (Complete Protocol Deviation)           Labs  reviewed by MD   6 Minute Walk 6 Minute Walk  Was the 6 minute walk performed? _22  Yes _23  No (Complete Protocol Deviation)   Date and Time of 6-minute walk (dd/MMM/yyyy [id number] clock):  04/09/21          TIME:1000      See worksheet for results     24 Hour Holter   Was the 24 Hour Holter applied? _24  Yes (Complete Below) _25  No (Complete Protocol Deviation)   Was the assessment collected on the same date as the Visit Date? _26  Yes _27  No   If no - Date 24 Hour Holter applied (dd/MMM/yyyy):     Was the 24 Hour Holter card uploaded or sent to the core lab?  _28  Yes (Complete Below) _29   No (Complete Protocol Deviation)    Date 24 Hour Holter uploaded or sent to the core lab (dd/MMM/yyyy):  17/Jun/2022    Was there confirmation from the Holter core lab that the data was interpretable? Please do not erase card until after confirmation the data were received and interpretable   _0  Yes _1  No (Complete Protocol Deviation)        KCCQ  Use subject completed source. Template is also in regulatory binder.  Was the KCCQ administered and completed? _2  Yes (Complete Below) _3  No (Complete Protocol Deviation)   Was the assessment collected on the same date as the Visit Date? _4  Yes _5  No   If no - Date KCCQ administered (dd/MMM/yyyy):     Was the KCCQ  administered In Person (at the site) or At Home (remotely)? _6  In Person _7  At Home     See work sheet in patient binder. Used subject completed source.           EQ-5D Was the EQ-5D collected:   _8  Yes (Complete Below) _9  No (Complete Protocol Deviation)  Was the assessment administered on the same date as the Visit Date? _10  Yes _11  No  Date EQ-5D administered 16/Jun/2022        DD                  MMM                             YYYY   Was the EQ-5D administered In Person (at the site) or At Home (remotely)? _12  In Person _13  At Home      See work sheet in patient binder. Used subject completed source.       Con Meds/AEs  Con Meds/AEs   Did the subject experience any Concomitant Medication Changes, Adverse Events, Serious Adverse Events, Adverse Device Events, Device Malfunctions, Hospital Admissions, or HF Hospitalization Equivalent visits since time of consent?  _14  Yes (Complete appropriate worksheet and         for  [name] sites, collect billing information)  _15  No                                                          Cardiopulmonary Exam  Was the CP Exam Performed? _16  Yes (Complete Below) _17  No (Complete Protocol Deviation)   Was the assessment collected on the same date as the Visit Date?  Date CP Exam Performed (dd/MMM/yyyy):  _18  Yes _19  No      Heart Sounds  S3: _20  Absent _21  Present   S4: _22  Absent _23  Present  Murmur  Murmur: _24  Yes _25  No   If Yes:  <ZOXWRUEAVWUJWJXB>_1<\/YNWGNFAOZHYQMVHQ>_46  Systolic <NGEXBMWUXLKGMWNU>_2<\/VOZDGUYQIHKVQQVZ>_56  Diastolic   If yes select grade: _28  I/VI _29  II/VI _30  III/VI _31  IV/VI _32  V/VI _33  VI/VI   Chest Sounds  Rales: _34  Yes _35  No   If yes check one: _36  ? 1/3 of lung fields full _37  1/2 of lung fields full _38  > 1/2 of lung fields full  If yes check one: _39  Left _40  Right _41  Bilateral    Edema   Edema (pedal): _42  Absent _43  Trace _44  1+ (slight) _45  2+ (moderate) _46  3+ (severe)    Hepatomegaly  Hepatomegaly: _47  None _48  < 2 cm _49   2-4 cm _0  > 4 cm  Peripheral Pulses  Peripheral Pulses: _1  Normal _2  Abnormal   If Abnormal, explain:        Physical Exam    Physical Examination      Was the physical exam performed?    Was the assessment collected on the same date as the Visit Date?    If No - Date of Echo (dd/MMM/yyyy):    General Appearance     If abnormal, clinically significant?   Describe Clinically Relevant abnormalities:    Ophthalmologic     If abnormal, clinically significant?   Describe Clinically Relevant abnormalities:     HEENT     If abnormal, clinically significant?   Describe Clinically Relevant  abnormalities:     Neurologic     If abnormal, clinically significant?  Describe Clinically Relevant abnormalities:     Gastrointestinal     If abnormal, clinically significant?   Describe Clinically Relevant abnormalities:    Dermatologic     If abnormal, clinically significant?  Describe Clinically Relevant abnormalities:     Musculoskeletal    If abnormal, clinically significant?    Describe Clinically Relevant abnormalities:         Other _3  Yes _4  No (complete a protocol deviation)   _5  Yes _6  No            _7  Normal _8  Abnormal _9  Not Done  _10  Yes _11  No          _12  Normal _13  Abnormal _14  Not Done  _15  Yes _16  No          _17  Normal _18  Abnormal _19  Not Done  _20  Yes _21  No             _22 Normal  _23 Abnormal  _24 Not Done   _25 Yes  _26 No           _27 Normal  _28 Abnormal  _29 Not Done      _30 Yes  _31 No         _32 Normal  _33 Abnormal  _34 Not Done    _35 Yes  _36 No          _37 Normal  _38 Abnormal  _39 Not Done    _40 Yes  _41 No              _42 Normal  _43 Abnormal  _44 Not Done   _45 Yes  _46 No           Other, specify:      Result:     If abnormal, clinically significant?    Describe Clinically Relevant abnormalities     _47  Normal _48  Abnormal _49  Not Done  _50  Yes _51  No              Health Status Question   Were the question asked   _52  Yes  _53  No  Was the assessment collected on the same date as the Visit date?  Date (dd/MMM/yyyy): _54  Yes  _55  No  Individual(s) questioned: check all that apply) _56  Patient _57  Caregiver/Carer _58 Family Members _59  Other individuals accompanying patient  Since you last visit here or the last time that we spoke with one another, have any of the following happened for the first time, or worsened, that we have not talked about previously? (check all that apply)  _60 Coughing or  Wheezing _61 Chest pain _62  Difficulty concentrating or decreased alertness _63 fatigue and weakness _64  Sensation of rapid or irregular heartbeat _65  More difficulty getting you chores done or taking care        of your self _66 More  shortness of breath  _0  Lightheadness or fainting  _1 Shortness of breath when you are physically active or       when you lie down _2  Swelling in your legs, ankles or feet, fluid retention,      swelling around your stomach _3   Water retention _4  None of the above   Did you see a health care provider for any of these or other urgent reason _5  Yes  _6  No  If yes, Where? _7  Clinic or office _8  Emergency room, _9  Urgent care  _10  Hospital (stayed one or more nights) _11    If yes, what kind of health care provider did you see? _12  Emergency or urgent care  _13  Family doctor, general practitioner _14 Cardiologist (or heart failure doctor) _15  Nurse  _16  pharmacy  _17  other    Other, specify    Since you last visit here or the last time that we spoke with one another, have you had any Concomitant Medication Changes? _18  Yes ( Modify the appropriate Concomitant Medication form in the casebook) _19  No  Since you last visit here or the last time that we spoke with the subject, has the subject had any Adverse Events or Serious Adverse Events not captured above? _20  Yes (create AE/SAE/Device Malfunction form as        needed) _21  No <[id number]>_5<\/FMBBUYZJQDUKRCVK>_18  Not applicable  Since you last visit here or the last time that we spoke with one another have there been any other changes in how you ( the patient) have been feeling, or a health problem that we have not talking about?   IF yes, list:

## 2021-04-09 NOTE — Addendum Note (Signed)
Addended by: Dutch Quint B on: 04/09/2021 12:10 PM   Modules accepted: Orders

## 2021-04-10 ENCOUNTER — Other Ambulatory Visit: Payer: Self-pay | Admitting: Cardiology

## 2021-04-10 DIAGNOSIS — I5022 Chronic systolic (congestive) heart failure: Secondary | ICD-10-CM | POA: Diagnosis not present

## 2021-04-11 LAB — BASIC METABOLIC PANEL
BUN/Creatinine Ratio: 20 (ref 9–20)
BUN: 24 mg/dL (ref 6–24)
CO2: 24 mmol/L (ref 20–29)
Calcium: 9.6 mg/dL (ref 8.7–10.2)
Chloride: 101 mmol/L (ref 96–106)
Creatinine, Ser: 1.19 mg/dL (ref 0.76–1.27)
Glucose: 98 mg/dL (ref 65–99)
Potassium: 4.7 mmol/L (ref 3.5–5.2)
Sodium: 140 mmol/L (ref 134–144)
eGFR: 73 mL/min/{1.73_m2} (ref >59)

## 2021-04-11 LAB — SPECIMEN STATUS REPORT

## 2021-04-13 ENCOUNTER — Ambulatory Visit: Payer: Medicare Other | Admitting: Cardiology
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
## 2021-04-29 ENCOUNTER — Encounter (HOSPITAL_COMMUNITY): Payer: Medicare Other

## 2021-05-05 ENCOUNTER — Ambulatory Visit (INDEPENDENT_AMBULATORY_CARE_PROVIDER_SITE_OTHER): Payer: Medicare Other

## 2021-05-05 DIAGNOSIS — I428 Other cardiomyopathies: Secondary | ICD-10-CM | POA: Diagnosis not present

## 2021-05-05 DIAGNOSIS — I429 Cardiomyopathy, unspecified: Secondary | ICD-10-CM

## 2021-05-05 DIAGNOSIS — I5023 Acute on chronic systolic (congestive) heart failure: Secondary | ICD-10-CM

## 2021-05-05 LAB — CUP PACEART REMOTE DEVICE CHECK
Battery Remaining Longevity: 40 mo
Battery Remaining Percentage: 45 %
Battery Voltage: 2.93 V
Date Time Interrogation Session: 20220712065719
HighPow Impedance: 69 Ohm
HighPow Impedance: 69 Ohm
Implantable Lead Implant Date: 20160629
Implantable Lead Implant Date: 20160629
Implantable Lead Implant Date: 20180416
Implantable Lead Location: 753858
Implantable Lead Location: 753859
Implantable Lead Location: 753860
Implantable Lead Model: 7122
Implantable Pulse Generator Implant Date: 20180416
Lead Channel Impedance Value: 400 Ohm
Lead Channel Impedance Value: 450 Ohm
Lead Channel Impedance Value: 540 Ohm
Lead Channel Pacing Threshold Amplitude: 0.5 V
Lead Channel Pacing Threshold Amplitude: 0.5 V
Lead Channel Pacing Threshold Pulse Width: 0.5 ms
Lead Channel Pacing Threshold Pulse Width: 0.5 ms
Lead Channel Sensing Intrinsic Amplitude: 0.2 mV
Lead Channel Sensing Intrinsic Amplitude: 6.5 mV
Lead Channel Setting Pacing Amplitude: 2 V
Lead Channel Setting Pacing Amplitude: 2 V
Lead Channel Setting Pacing Pulse Width: 0.5 ms
Lead Channel Setting Pacing Pulse Width: 0.5 ms
Lead Channel Setting Sensing Sensitivity: 0.5 mV
Pulse Gen Serial Number: 7413271

## 2021-05-17 NOTE — Progress Notes (Signed)
PCP: [name], MD Cardiology: Dr. Wyline Mood HF Cardiology: Dr. Shirlee Latch  55 y.o. with history of nonischemic cardiomyopathy and chronic atrial fibrillation was referred by Dr. Wyline Mood for CHF evaluation.  Patient was diagnosed with CHF about 10 years ago.  He has had persistently low EF.  Most recent echo in 2/19 showed EF 20-25%.  Cath in 1/16 showed no significant CAD.  He has a Secondary school teacher CRT-D device.  He has been in chronic atrial fibrillation for years now.  He had ablation x 2 with breakthrough AF, also failed Tikosyn and amiodarone. 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  No ETOH or smoking.    Echo (12/21) showed EF 20-25%, severe LV dilation, mild LVH, moderate RV enlargement with moderately decreased systolic function.  Echo in 5/22 showed EF < 20%, severe LV dilation, mild RV enlargement with normal RV function, mild-moderate MR, dilated IVC.   Patient was enrolled in ANTHEM trial but is in the usual treatment arm so did not get a device.   Patient returned 6/22 for followup of CHF.  BP low in the office but he denied lightheadedness.  SBP in 90s-100s generally at CR.  Cardiac rehab is helping.  Weight is down 3 lbs, he walks 3 miles on the treadmill without dyspnea.  No problems walking up stairs. No orthopnea/PND. Lasix increased and CPX scheduled.  Today he returns for HF follow up. Overall feeling fine. No longer walking on TM but walks in the yard. Denies increasing SOB, CP, dizziness, edema, or PND/Orthopnea. Appetite ok. No fever or chills. Weight at home 315 pounds. Taking all medications. Does not tolerate his CPAP. Building a house, outside a lot. Step father passed away today.  Labs May 14, 2023): TSH normal, hgb 13.6, creatinine 0.97, LDL 97 Labs (3/22): K 5.1, creatinine 1.14 Labs (6/22): K 4.7, creatinine 1.19  ECG (personally reviewed): Atrial fibrillation, Bi-V pacing.  Device Interrogation (personally reviewed): Daily impedence below threshold,  >94% v-pacing, he is chronically in atrial fibrillation  ReDs: 36%  PMH: 1. Atrial fibrillation: Chronic. He has had ablation x 2 with breakthrough atrial fibrillation. He failed Tikosyn and amiodarone.  2. H/o VT 3. OSA: Tries to use CPAP.  4. Obesity 5. Chronic systolic CHF: Nonischemic cardiomyopathy. St Jude CRT-D device.  - Echo (2013): EF 40%.  - Echo (1/16): EF 15-20%.  - LHC (1/16): Normal coronaries.  - Echo (2/19): EF 20-25% - Echo (12/21): EF 20-25%, severe LV dilation, mild LVH, moderate RV enlargement with moderately decreased systolic function.  - CPX (12/21): RER 1.18, VE/VCO2 slope 29, peak VO2 13.1.  No significant HF limitation, primarily limited by deconditioning.  - Echo (5/22): EF < 20%, severe LV dilation, mild RV enlargement with normal RV function, mild-moderate MR, dilated IVC.  - ANTHEM trial in usual treatment arm.   FH: Grandfather died of MI at 18.  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   ROS: All systems reviewed and negative except as per HPI.   Current Outpatient Medications  Medication Sig Dispense Refill   acetaminophen (TYLENOL) 500 MG tablet Take 1,000 mg by mouth daily as needed for mild pain or headache.      ENTRESTO 97-103 MG TAKE ONE TABLET BY MOUTH TWICE DAILY. 60 tablet 6   furosemide (LASIX) 40 MG tablet Take 1 tablet (40 mg total) by mouth 2 (two) times daily. 180 tablet 3   metoprolol succinate (TOPROL-XL) 100 MG 24 hr  tablet TAKE (1) TABLET BY MOUTH TWICE DAILY. TAKE WITH OR IMMEDIATELY FOLLOWING A MEAL. 60 tablet 6   spironolactone (ALDACTONE) 25 MG tablet Take 1 tablet (25 mg total) by mouth daily. 90 tablet 3   XARELTO 20 MG TABS tablet TAKE ONE TABLET BY MOUTH EVERY DAY WITH DINNER. 30 tablet 6   No current facility-administered medications for this encounter.   Wt Readings from Last 3 Encounters:  05/18/21 (!) 143.5 kg (316 lb 6.4 oz)  04/01/21 (!) 140.7 kg (310 lb 3 oz)  03/30/21  (!) 142.4 kg (314 lb)   BP 90/62   Pulse 71   Wt (!) 143.5 kg (316 lb 6.4 oz)   SpO2 99%   BMI 42.91 kg/m  ReDs Clip: 36% General:  NAD. No resp difficulty HEENT: Normal Neck: Supple. JVP to jaw. Carotids 2+ bilat; no bruits. No lymphadenopathy or thryomegaly appreciated. Cor: PMI nondisplaced. Regular rate & rhythm. No rubs, gallops or murmurs. Lungs: Clear Abdomen: Obese, nontender, nondistended. No hepatosplenomegaly. No bruits or masses. Good bowel sounds. Extremities: No cyanosis, clubbing, rash, edema Neuro: Alert & oriented x 3, cranial nerves grossly intact. Moves all 4 extremities w/o difficulty. Affect pleasant.  Assessment/Plan: 1. Chronic systolic CHF: Nonischemic cardiomyopathy x years.  St Jude BiV ICD.  Cath in 1/16 with no significant CAD.  Most recent echo in 12/21 with EF 20-25%.  CPX in 12/21 showed no definite HF limitation, primarily limited by deconditioning.  Cause of cardiomyopathy uncertain, ? viral myocarditis.  He does not have a strong family history of cardiomyopathy.  His pacemaker is not compatible with cardiac MRI.  He is in chronic atrial fibrillation but BiV pacing percentage has been > 94% chronically.  NYHA class II although not very physically active, mild volume overload by exam.  - Increase lasix to 60 mg bid x 3 days, then back to 40 mg daily. BMET today, repeat 10 days. - Will have him send transmission in 10-14 days to ensure fluid levels are stable. - Continue Toprol XL 100 mg bid.  - Continue Entresto 97/103 bid. - Continue spironolactone 25 mg daily.  - Unable to tolerate SGLT2 inhibitor due to yeast infections.  May try again after he has lost some weight.  - In ANTHEM trial but did not randomize to the device.  - He has completed cardiac rehab.  - Repeat CPX today, results pending. 2. Atrial fibrillation: Chronic, he has failed ablation x 2, Tikosyn, and amiodarone.   Plan for rate-control.  - Continue Toprol XL.  - Continue Xarelto 20 mg  daily. No bleeding issues. 3. Obesity: Body mass index is 42.91 kg/m. - Counseled on need for weight loss.  Followup in 2-3 months with Dr. Shirlee Latch.  Anderson Malta [facility] FNP 05/18/2021

## 2021-05-18 ENCOUNTER — Ambulatory Visit (HOSPITAL_COMMUNITY): Payer: Medicare Other

## 2021-05-18 ENCOUNTER — Encounter (HOSPITAL_COMMUNITY): Payer: Self-pay

## 2021-05-18 ENCOUNTER — Other Ambulatory Visit: Payer: Self-pay

## 2021-05-18 ENCOUNTER — Ambulatory Visit (HOSPITAL_COMMUNITY)
Admission: RE | Admit: 2021-05-18 | Discharge: 2021-05-18 | Disposition: A | Discharge status: Home / Self Care | Payer: Medicare Other | Source: Ambulatory Visit | Attending: Family Medicine | Admitting: Family Medicine

## 2021-05-18 VITALS — BP 90/62 | HR 71 | Wt 316.4 lb

## 2021-05-18 DIAGNOSIS — G4733 Obstructive sleep apnea (adult) (pediatric): Secondary | ICD-10-CM | POA: Diagnosis not present

## 2021-05-18 DIAGNOSIS — Z6841 Body Mass Index (BMI) 40.0 and over, adult: Secondary | ICD-10-CM | POA: Diagnosis not present

## 2021-05-18 DIAGNOSIS — I482 Chronic atrial fibrillation, unspecified: Secondary | ICD-10-CM | POA: Insufficient documentation

## 2021-05-18 DIAGNOSIS — E669 Obesity, unspecified: Secondary | ICD-10-CM | POA: Insufficient documentation

## 2021-05-18 DIAGNOSIS — Z79899 Other long term (current) drug therapy: Secondary | ICD-10-CM | POA: Diagnosis not present

## 2021-05-18 DIAGNOSIS — Z7901 Long term (current) use of anticoagulants: Secondary | ICD-10-CM | POA: Insufficient documentation

## 2021-05-18 DIAGNOSIS — I5022 Chronic systolic (congestive) heart failure: Secondary | ICD-10-CM

## 2021-05-18 DIAGNOSIS — I428 Other cardiomyopathies: Secondary | ICD-10-CM | POA: Insufficient documentation

## 2021-05-18 DIAGNOSIS — I4891 Unspecified atrial fibrillation: Secondary | ICD-10-CM

## 2021-05-18 DIAGNOSIS — Z950 Presence of cardiac pacemaker: Secondary | ICD-10-CM | POA: Insufficient documentation

## 2021-05-18 LAB — BASIC METABOLIC PANEL
Anion gap: 9 (ref 5–15)
BUN: 21 mg/dL — ABNORMAL HIGH (ref 6–20)
CO2: 27 mmol/L (ref 22–32)
Calcium: 9.5 mg/dL (ref 8.9–10.3)
Chloride: 98 mmol/L (ref 98–111)
Creatinine, Ser: 1.27 mg/dL — ABNORMAL HIGH (ref 0.61–1.24)
GFR, Estimated: >60 mL/min (ref >60)
Glucose, Bld: 101 mg/dL — ABNORMAL HIGH (ref 70–99)
Potassium: 4.8 mmol/L (ref 3.5–5.1)
Sodium: 134 mmol/L — ABNORMAL LOW (ref 135–145)

## 2021-05-18 NOTE — Patient Instructions (Signed)
INCREASE Lasix to 60 mg twice a day for 3 days, then resume normal dose of 04 mg twice a day thereafter  Labs today We will only contact you if something comes back abnormal or we need to make some changes. Otherwise no news is good news!  Labs needed in 10 days  Your physician recommends that you schedule a follow-up appointment in: 2-3 months with Dr Shirlee Latch  Do the following things EVERYDAY: Weigh yourself in the morning before breakfast. Write it down and keep it in a log. Take your medicines as prescribed Eat low salt foods--Limit salt (sodium) to 2000 mg per day.  Stay as active as you can everyday Limit all fluids for the day to less than 2 liters  milAt the [facility], you and your health needs are our priority. As part of our continuing mission to provide you with exceptional heart care, we have created designated Provider Care Teams. These Care Teams include your primary Cardiologist (physician) and Advanced Practice Providers (APPs- Physician Assistants and Nurse Practitioners) who all work together to provide you with the care you need, when you need it.   You may see any of the following providers on your designated Care Team at your next follow up: Dr Arvilla Meres Dr Marca Ancona Dr Brandon Melnick, NP Robbie Lis, [place] Mikki Santee Karle Plumber, PharmD   Please be sure to bring in all your medications bottles to every appointment.   If you have any questions or concerns before your next appointment please send [name] a message through [place] or call our office at [phone].    TO LEAVE A MESSAGE FOR THE NURSE SELECT OPTION 2, PLEASE LEAVE A MESSAGE INCLUDING: YOUR NAME DATE OF BIRTH CALL BACK NUMBER REASON FOR CALL**this is important as we prioritize the call backs  YOU WILL RECEIVE A CALL BACK THE SAME DAY AS LONG AS YOU CALL BEFORE 4:00 PM

## 2021-05-18 NOTE — Progress Notes (Unsigned)
el

## 2021-05-18 NOTE — Progress Notes (Signed)
ReDS Vest / Clip - 05/18/21 1200       ReDS Vest / Clip   Station Marker D    Ruler Value 36.5    ReDS Value Range Moderate volume overload    ReDS Actual Value 36

## 2021-05-26 ENCOUNTER — Other Ambulatory Visit: Payer: Self-pay | Admitting: Cardiology

## 2021-05-26 NOTE — Telephone Encounter (Signed)
Prescription refill request for Xarelto received.  Indication: Atrial Fib Last office visit: 05/18/21  Everlean Alstrom NP Weight: 143.5kg Age: 55 Scr: 1.27 CrCl: 136.53  Based on above findings Xarelto 20mg  daily is the appropriate dose.  Refill approved.

## 2021-05-27 ENCOUNTER — Ambulatory Visit (INDEPENDENT_AMBULATORY_CARE_PROVIDER_SITE_OTHER): Payer: Medicare Other

## 2021-05-27 DIAGNOSIS — I5022 Chronic systolic (congestive) heart failure: Secondary | ICD-10-CM

## 2021-05-27 DIAGNOSIS — Z95810 Presence of automatic (implantable) cardiac defibrillator: Secondary | ICD-10-CM

## 2021-05-27 NOTE — Progress Notes (Signed)
EPIC Encounter for ICM Monitoring  Patient Name: Brandon Torres is a 55 y.o. male Date: 05/27/2021 Primary Care Physican: [name], MD Primary Cardiologist: Shirlee Latch Electrophysiologist: Rae Roam Pacing: 95%  05/18/2021 Office Weight: 316 lbs   AT/AF Burden: 93%       One time ICM transmission reviewed as requested by Prince Rome, NP HF clinic.               CorVue thoracic impedance normal after HF clinic instructions at 7/25 OV given to increase lasix to 60 mg bid x 3 days, then back to 40 mg daily   Prescribed:  Furosemide 40 mg take 1 tablet (40 mg total) by mouth twice a day. Spironolactone 25 mg take 1 tablet daily  Recommendations: Any recommendations will be given by HF clinic.  Follow-up plan: No further ICM clinic phone appointment at this time.     Copy of ICM check sent to Dr. Ladona Ridgel and Prince Rome, NP at requested.   3 month ICM trend: 05/27/2021.    1 Year ICM trend:       Karie Soda, RN 05/27/2021 8:44 AM

## 2021-05-28 NOTE — Progress Notes (Signed)
Remote ICD transmission.   

## 2021-05-29 ENCOUNTER — Other Ambulatory Visit: Payer: Self-pay

## 2021-05-29 DIAGNOSIS — I509 Heart failure, unspecified: Secondary | ICD-10-CM | POA: Diagnosis not present

## 2021-05-30 LAB — BASIC METABOLIC PANEL
BUN/Creatinine Ratio: 17 (ref 9–20)
BUN: 19 mg/dL (ref 6–24)
CO2: 22 mmol/L (ref 20–29)
Calcium: 9.5 mg/dL (ref 8.7–10.2)
Chloride: 101 mmol/L (ref 96–106)
Creatinine, Ser: 1.13 mg/dL (ref 0.76–1.27)
Glucose: 93 mg/dL (ref 65–99)
Potassium: 4.3 mmol/L (ref 3.5–5.2)
Sodium: 138 mmol/L (ref 134–144)
eGFR: 78 mL/min/{1.73_m2} (ref >59)

## 2021-06-15 ENCOUNTER — Other Ambulatory Visit (HOSPITAL_COMMUNITY): Payer: Self-pay | Admitting: Cardiology

## 2021-06-17 ENCOUNTER — Other Ambulatory Visit: Payer: Self-pay

## 2021-06-17 ENCOUNTER — Ambulatory Visit (INDEPENDENT_AMBULATORY_CARE_PROVIDER_SITE_OTHER): Payer: Medicare Other | Admitting: Internal Medicine

## 2021-06-17 VITALS — BP 90/58 | HR 74 | Ht 72.0 in | Wt 310.8 lb

## 2021-06-17 DIAGNOSIS — Z95810 Presence of automatic (implantable) cardiac defibrillator: Secondary | ICD-10-CM

## 2021-06-17 DIAGNOSIS — I4819 Other persistent atrial fibrillation: Secondary | ICD-10-CM | POA: Diagnosis not present

## 2021-06-17 DIAGNOSIS — I5022 Chronic systolic (congestive) heart failure: Secondary | ICD-10-CM

## 2021-06-17 NOTE — Patient Instructions (Signed)
Medication Instructions:  Your physician recommends that you continue on your current medications as directed. Please refer to the Current Medication list given to you today.  Labwork: None ordered.  Testing/Procedures: None ordered.  Follow-Up: Your physician wants you to follow-up in: one year with Lewayne Bunting, MD or one of the following Advanced Practice Providers on your designated Care Team:   Francis Dowse, [place] Casimiro Needle "Mardelle Matte" Lanna Poche, [place]  Remote monitoring is used to monitor your ICD from home. This monitoring reduces the number of office visits required to check your device to one time per year. It allows [name] to keep an eye on the functioning of your device to ensure it is working properly. You are scheduled for a device check from home on 08/04/2021. You may send your transmission at any time that day. If you have a wireless device, the transmission will be sent automatically. After your physician reviews your transmission, you will receive a postcard with your next transmission date.  Any Other Special Instructions Will Be Listed Below (If Applicable).  If you need a refill on your cardiac medications before your next appointment, please call your pharmacy.
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
## 2021-06-19 ENCOUNTER — Encounter (HOSPITAL_COMMUNITY): Payer: Self-pay | Admitting: Adult Health

## 2021-06-19 NOTE — Progress Notes (Addendum)
SCR [id number] __                                                                                                                                                    Randomization # [id number]  [id number]  [id number]  New York Heart Association  (NYHA)  These individuals should be listed on the site delegation of duties (DOD) log with these tasks delegated to them.  They should not play any other role in the study to keep them blinded to treatment assignment (Therapy Arm versus Control Arm).   New York Heart Association (NYHA) Heart Failure Classification   Was NYHA Performed? [x]  Yes (Complete Below) []  No (Complete Protocol Deviation)   Was the assessment collected on the same date as the Visit Date? [x]  Yes [x]  No   If no - Date of NYHA (dd/MMM/yyyy): 04/09/2021  Was the NYHA administered In Person (at the site) or At Home (remotely)? [x]  In Person []  At Home    Indicate the subject's NYHA Classification at the time of the assessment:  []  Class I: No limitation of physical activity. Ordinary physical activity does not cause undue fatigue, palpitation or dyspnea (shortness of breath).  []  Class II: Slight limitation of physical activity. Comfortable at rest, but ordinary physical activity results in fatigue, palpitation or dyspnea.  [x]  Class III: Marked limitation of physical activity. Comfortable at rest, but less than ordinary activity causes fatigue, palpitation or dyspnea.  []  Class IV: Unable to carry out any physical activity without discomfort. Symptoms of cardiac insufficiency at rest. If any physical activity is undertaken, discomfort is increased.   Visit interval  []  Enrollment  []  Week 4 []  Week 10/ Final Titration [x]  3 month  []  6 month []  9 month []  12 month []  16 month  []  32 month []  48 month    Trenesha Alcaide NP-C  9:45 AM

## 2021-07-21 ENCOUNTER — Ambulatory Visit (INDEPENDENT_AMBULATORY_CARE_PROVIDER_SITE_OTHER): Payer: Medicare Other | Admitting: Cardiology

## 2021-07-21 VITALS — BP 88/58 | HR 71 | Ht 72.0 in | Wt 308.8 lb

## 2021-07-21 DIAGNOSIS — I472 Ventricular tachycardia, unspecified: Secondary | ICD-10-CM

## 2021-07-21 DIAGNOSIS — I4891 Unspecified atrial fibrillation: Secondary | ICD-10-CM

## 2021-07-21 DIAGNOSIS — I5022 Chronic systolic (congestive) heart failure: Secondary | ICD-10-CM

## 2021-07-21 DIAGNOSIS — Z23 Encounter for immunization: Secondary | ICD-10-CM

## 2021-07-21 NOTE — Patient Instructions (Addendum)
Medication Instructions:  Continue all current medications.   Labwork: none  Testing/Procedures: none  Follow-Up: 6 months   Any Other Special Instructions Will Be Listed Below (If Applicable).   If you need a refill on your cardiac medications before your next appointment, please call your pharmacy.  
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
## 2021-07-26 ENCOUNTER — Other Ambulatory Visit: Payer: Self-pay | Admitting: Cardiology

## 2021-07-27 ENCOUNTER — Other Ambulatory Visit: Payer: Self-pay | Admitting: Cardiology

## 2021-07-28 ENCOUNTER — Other Ambulatory Visit: Payer: Self-pay

## 2021-07-28 ENCOUNTER — Encounter (HOSPITAL_COMMUNITY): Payer: Self-pay | Admitting: Adult Health

## 2021-07-28 ENCOUNTER — Ambulatory Visit (HOSPITAL_COMMUNITY)
Admission: RE | Admit: 2021-07-28 | Discharge: 2021-07-28 | Disposition: A | Discharge status: Home / Self Care | Payer: Medicare Other | Source: Ambulatory Visit

## 2021-07-28 VITALS — BP 90/54 | HR 68 | Temp 98.2°F | Resp 14 | Wt 307.0 lb

## 2021-07-28 DIAGNOSIS — Z006 Encounter for examination for normal comparison and control in clinical research program: Secondary | ICD-10-CM

## 2021-07-28 DIAGNOSIS — I5042 Chronic combined systolic (congestive) and diastolic (congestive) heart failure: Secondary | ICD-10-CM | POA: Diagnosis not present

## 2021-07-28 LAB — ECHOCARDIOGRAM COMPLETE: S' Lateral: 5.7 cm

## 2021-07-28 NOTE — Progress Notes (Signed)
SCR [id number]                                                                                                                                                     Randomization # [id number]   New York Heart Association  (NYHA)  These individuals should be listed on the site delegation of duties (DOD) log with these tasks delegated to them.  They should not play any other role in the study to keep them blinded to treatment assignment (Therapy Arm versus Control Arm).   New York Heart Association (NYHA) Heart Failure Classification   Was NYHA Performed? [x]  Yes (Complete Below) []  No (Complete Protocol Deviation)   Was the assessment collected on the same date as the Visit Date? [x]  Yes []  No   If no - Date of NYHA (dd/MMM/yyyy): 07/28/2021   Was the NYHA administered In Person (at the site) or At Home (remotely)? [x]  In Person []  At Home    Indicate the subject's NYHA Classification at the time of the assessment:  []  Class I: No limitation of physical activity. Ordinary physical activity does not cause undue fatigue, palpitation or dyspnea (shortness of breath).  [x]  Class II: Slight limitation of physical activity. Comfortable at rest, but ordinary physical activity results in fatigue, palpitation or dyspnea.  []  Class III: Marked limitation of physical activity. Comfortable at rest, but less than ordinary activity causes fatigue, palpitation or dyspnea.  []  Class IV: Unable to carry out any physical activity without discomfort. Symptoms of cardiac insufficiency at rest. If any physical activity is undertaken, discomfort is increased.   Visit interval  []  Enrollment  []  Week 4 []  Week 10/ Final Titration []  3 month  [x]  6 month []  9 month []  12 month []  16 month  []  32 month []  48 month    Belen Zwahlen NP-C  3:57 PM

## 2021-07-28 NOTE — Research (Addendum)
[facility] HF Research Study  6 Month Follow-up                                      Visits Date: 28-Jul-2021 [id number]  Randomization #                                                     SCR#    Visit Date    07/28/21            Echo   Was the Echo acquired? [x] Yes (Complete Below) [] No (Complete Protocol Deviation)   Was the assessment collected on the same date as the Visit Date?   [x] Yes [] No     If No - Date of Echo (dd/MMM/yyyy):     Was the eligibility criterion met according to the core lab?   [] Yes (Left Ventricular Ejection Fraction meets eligibility criteria of ? 35% and left ventricular end-diastolic diameter (LVEDD) < 8.0 cm)  [] No (Left Ventricular Ejection Fraction does not meet eligibility criteria of ? 35% or  left ventricular end-diastolic diameter is not (LVEDD) < 8.0 cm,  .)    Send the Echo to the Core Lab as soon after acquisition as possible.            Vital Signs  Vital Signs  Were Vital Signs Collected?  [x]Yes (Complete Below)  []No (Complete Protocol Deviation)   Was the assessment collected on the same date as the Visit Date?  [x]Yes  []No   If no -Date of vital signs (dd/MMM/yyyy):    Systolic Blood Pressure (mmHg):    90   Diastolic Blood Pressure (mmHg):    54  Weight: 307     Units:  []Kilograms  [x]Pounds    Height: 72     Units:  []centimeters  [x]inches     Heart Rate (bpm): 68    Biomarkers   Were the blood samples drawn for the biomarkers? [x] Yes (Complete Below) [] No (Complete Protocol Deviation)   Was the assessment collected on the same date as the Visit Date? [x] Yes [] No   If no - Date blood samples drawn (dd/MMM/yyyy):      Were all of the biomarker samples shipped to the lab?   [x] Yes (Complete Below) [] No (Complete Protocol Deviation)   Date blood samples shipped (dd/MMM/yyyy):  04/Oct/2022     Were all the samples viable for analysis:   [x] Yes [] No  (Complete Protocol Deviation)           6 Minute Walk 6 Minute Walk  Was the 6 minute walk performed? [x] Yes [] No (Complete Protocol Deviation)   Date and Time of 6-minute walk (dd/MMM/yyyy HH:mm24hr clock):  07/28/21          TIME:1000      See worksheet for results     24 Hour Holter   Was the 24 Hour Holter applied? [x] Yes (Complete Below) [] No (Complete Protocol Deviation)   Was the assessment collected on the same date as the Visit Date? [x] Yes [] No   If no - Date 24 Hour Holter applied (dd/MMM/yyyy):     Was the 24 Hour Holter card uploaded or   sent to the core lab?  [x] Yes (Complete Below) [] No (Complete Protocol Deviation)    Date 24 Hour Holter uploaded or sent to the core lab (dd/MMM/yyyy):      Was there confirmation from the Holter core lab that the data was interpretable? Please do not erase card until after confirmation the data were received and interpretable   [x] Yes [] No (Complete Protocol Deviation)        KCCQ  Use subject completed source. Template is also in regulatory binder.  Was the KCCQ administered and completed? [x] Yes (Complete Below) [] No (Complete Protocol Deviation)   Was the assessment collected on the same date as the Visit Date? [x] Yes [] No   If no - Date KCCQ administered (dd/MMM/yyyy):     Was the KCCQ  administered In Person (at the site) or At Home (remotely)? [x] In Person [] At Home     See work sheet in patient binder. Used subject completed source.           EQ-5D Was the EQ-5D collected:   [x] Yes (Complete Below) [] No (Complete Protocol Deviation)  Was the assessment administered on the same date as the Visit Date? [x] Yes [] No  Date EQ-5D administered 04/Oct/2022        DD                  MMM                             YYYY   Was the EQ-5D administered In Person (at the site) or At Home (remotely)? [x] In Person [] At Home      See work sheet in patient binder. Used subject  completed source.      Con Meds/AEs  Con Meds/AEs   Did the subject experience any Concomitant Medication Changes, Adverse Events, Serious Adverse Events, Adverse Device Events, Device Malfunctions, Hospital Admissions, or HF Hospitalization Equivalent visits since time of consent?  [] Yes (Complete appropriate worksheet and         for  [name] sites, collect billing information)  [x] No       Health Status Question   Were the question asked   [x] Yes  [] No  Was the assessment collected on the same date as the Visit date?  Date (dd/MMM/yyyy): [x] Yes  [] No  Individual(s) questioned: check all that apply) [x] Patient [] Caregiver/Carer []Family Members [] Other individuals accompanying patient  Since you last visit here or the last time that we spoke with one another, have any of the following happened for the first time, or worsened, that we have not talked about previously? (check all that apply)  []Coughing or Wheezing []Chest pain [] Difficulty concentrating or decreased alertness []fatigue and weakness [] Sensation of rapid or irregular heartbeat [] More difficulty getting you chores done or taking care        of your self []More shortness of breath  [] Lightheadness or fainting  []Shortness of breath when you are physically active or       when you lie down [] Swelling in your legs, ankles or feet, fluid retention,      swelling around your stomach []  Water retention [x] None of the above   Did you see a health care provider for any of these or other urgent reason [] Yes  [x] No  If  yes, Where? [] Clinic or office [] Emergency room, [] Urgent care  [] Hospital (stayed one or more nights) []   If yes, what kind of health care provider did you see? [] Emergency or urgent care  [] Family doctor, general practitioner []Cardiologist (or heart failure doctor) [] Nurse  [] pharmacy  [] other    Other, specify    Since you last visit here or the last time  that we spoke with one another, have you had any Concomitant Medication Changes? [] Yes ( Modify the appropriate Concomitant Medication form in the casebook) [x] No  Since you last visit here or the last time that we spoke with the subject, has the subject had any Adverse Events or Serious Adverse Events not captured above? [] Yes (create AE/SAE/Device Malfunction form as        needed) [] No [x] Not applicable  Since you last visit here or the last time that we spoke with one another have there been any other changes in how you ( the patient) have been feeling, or a health problem that we have not talking about?   IF yes, list:                                                                                                                                   Cardiopulmonary Exam  Was the CP Exam Performed? [x] Yes (Complete Below) [] No (Complete Protocol Deviation)   Was the assessment collected on the same date as the Visit Date?  Date CP Exam Performed (dd/MMM/yyyy):  [x] Yes [] No  04/Oct/2022    Heart Sounds  S3: [x] Absent [] Present   S4: [x] Absent [] Present  Murmur  Murmur: [] Yes [x] No   If Yes:  [] Systolic [] Diastolic   If yes select grade: [] I/VI [] II/VI [] III/VI [] IV/VI [] V/VI [] VI/VI   Chest Sounds  Rales: [] Yes [x] No   If yes check one: [] ? 1/3 of lung fields full [] 1/2 of lung fields full [] > 1/2 of lung fields full  If yes check one: [] Left [] Right [] Bilateral    Edema   Edema (pedal): [x] Absent [] Trace [] 1+ (slight) [] 2+ (moderate) [] 3+ (severe)    Hepatomegaly  Hepatomegaly: [x] None [] < 2 cm [] 2-4 cm [] > 4 cm  Peripheral Pulses  Peripheral Pulses: [x] Normal [] Abnormal   If Abnormal, explain:        Physical Exam    Physical Examination      Was the physical exam performed?    Was the assessment collected on the same date as the Visit Date?      General  Appearance     If abnormal, clinically significant?   Describe Clinically Relevant abnormalities:    Ophthalmologic     If abnormal, clinically   significant?   Describe Clinically Relevant abnormalities:     HEENT     If abnormal, clinically significant?   Describe Clinically Relevant abnormalities:     Neurologic     If abnormal, clinically significant?  Describe Clinically Relevant abnormalities:     Gastrointestinal     If abnormal, clinically significant?   Describe Clinically Relevant abnormalities:    Dermatologic     If abnormal, clinically significant?  Describe Clinically Relevant abnormalities:     Musculoskeletal    If abnormal, clinically significant?    Describe Clinically Relevant abnormalities:         Other [x] Yes [] No (complete a protocol deviation)   [x] Yes [] No           [x] Normal [] Abnormal [] Not Done  [] Yes [] No         [x] Normal [] Abnormal [] Not Done  [] Yes [] No          [x] Normal [] Abnormal [] Not Done  [] Yes [] No             [x]Normal  []Abnormal  []Not Done   []Yes  []No          [x]Normal  []Abnormal  []Not Done      []Yes  []No         [x]Normal  []Abnormal  []Not Done    []Yes  []No          [x]Normal  []Abnormal  []Not Done    []Yes  []No              []Normal  []Abnormal  []Not Done   []Yes  []No           Other, specify:      Result:     If abnormal, clinically significant?    Describe Clinically Relevant abnormalities     [] Normal [] Abnormal [] Not Done  [] Yes [] No              

## 2021-07-28 NOTE — Progress Notes (Signed)
  Echocardiogram 2D Echocardiogram has been performed.  Brandon Torres 07/28/2021, 9:51 AM

## 2021-08-04 ENCOUNTER — Ambulatory Visit (INDEPENDENT_AMBULATORY_CARE_PROVIDER_SITE_OTHER): Payer: Medicare Other

## 2021-08-04 DIAGNOSIS — I5022 Chronic systolic (congestive) heart failure: Secondary | ICD-10-CM

## 2021-08-06 LAB — CUP PACEART REMOTE DEVICE CHECK
Battery Remaining Longevity: 36 mo
Battery Remaining Percentage: 41 %
Battery Voltage: 2.92 V
Date Time Interrogation Session: 20221011151751
HighPow Impedance: 78 Ohm
HighPow Impedance: 78 Ohm
Implantable Lead Implant Date: 20160629
Implantable Lead Implant Date: 20160629
Implantable Lead Implant Date: 20180416
Implantable Lead Location: 753858
Implantable Lead Location: 753859
Implantable Lead Location: 753860
Implantable Lead Model: 7122
Implantable Pulse Generator Implant Date: 20180416
Lead Channel Impedance Value: 390 Ohm
Lead Channel Impedance Value: 460 Ohm
Lead Channel Impedance Value: 540 Ohm
Lead Channel Pacing Threshold Amplitude: 0.5 V
Lead Channel Pacing Threshold Amplitude: 0.5 V
Lead Channel Pacing Threshold Pulse Width: 0.5 ms
Lead Channel Pacing Threshold Pulse Width: 0.5 ms
Lead Channel Sensing Intrinsic Amplitude: 0.2 mV
Lead Channel Sensing Intrinsic Amplitude: 6.2 mV
Lead Channel Setting Pacing Amplitude: 2 V
Lead Channel Setting Pacing Amplitude: 2 V
Lead Channel Setting Pacing Pulse Width: 0.5 ms
Lead Channel Setting Pacing Pulse Width: 0.5 ms
Lead Channel Setting Sensing Sensitivity: 0.5 mV
Pulse Gen Serial Number: 7413271

## 2021-08-13 NOTE — Progress Notes (Signed)
Remote ICD transmission.   

## 2021-08-17 ENCOUNTER — Telehealth (HOSPITAL_COMMUNITY): Payer: Self-pay | Admitting: Adult Health

## 2021-08-17 NOTE — Telephone Encounter (Signed)
Error Brandon Torres 3:53 PM

## 2021-08-20 ENCOUNTER — Encounter (HOSPITAL_COMMUNITY): Payer: Self-pay | Admitting: Cardiology

## 2021-08-20 ENCOUNTER — Ambulatory Visit (HOSPITAL_COMMUNITY)
Admission: RE | Admit: 2021-08-20 | Discharge: 2021-08-20 | Disposition: A | Discharge status: Home / Self Care | Payer: Medicare Other | Source: Ambulatory Visit | Attending: Cardiology | Admitting: Cardiology

## 2021-08-20 ENCOUNTER — Other Ambulatory Visit: Payer: Self-pay

## 2021-08-20 ENCOUNTER — Telehealth: Payer: Self-pay | Admitting: Pharmacist

## 2021-08-20 VITALS — BP 90/66 | HR 74 | Wt 305.4 lb

## 2021-08-20 DIAGNOSIS — E669 Obesity, unspecified: Secondary | ICD-10-CM | POA: Insufficient documentation

## 2021-08-20 DIAGNOSIS — I5022 Chronic systolic (congestive) heart failure: Secondary | ICD-10-CM

## 2021-08-20 DIAGNOSIS — Z7901 Long term (current) use of anticoagulants: Secondary | ICD-10-CM | POA: Diagnosis not present

## 2021-08-20 DIAGNOSIS — I482 Chronic atrial fibrillation, unspecified: Secondary | ICD-10-CM | POA: Diagnosis not present

## 2021-08-20 DIAGNOSIS — I5040 Unspecified combined systolic (congestive) and diastolic (congestive) heart failure: Secondary | ICD-10-CM | POA: Diagnosis not present

## 2021-08-20 DIAGNOSIS — I428 Other cardiomyopathies: Secondary | ICD-10-CM | POA: Insufficient documentation

## 2021-08-20 DIAGNOSIS — Z950 Presence of cardiac pacemaker: Secondary | ICD-10-CM | POA: Insufficient documentation

## 2021-08-20 LAB — BASIC METABOLIC PANEL
Anion gap: 6 (ref 5–15)
BUN: 16 mg/dL (ref 6–20)
CO2: 28 mmol/L (ref 22–32)
Calcium: 9.4 mg/dL (ref 8.9–10.3)
Chloride: 101 mmol/L (ref 98–111)
Creatinine, Ser: 1.07 mg/dL (ref 0.61–1.24)
GFR, Estimated: >60 mL/min (ref >60)
Glucose, Bld: 100 mg/dL — ABNORMAL HIGH (ref 70–99)
Potassium: 4.9 mmol/L (ref 3.5–5.1)
Sodium: 135 mmol/L (ref 135–145)

## 2021-08-20 LAB — CBC
HCT: 42.6 % (ref 39.0–52.0)
Hemoglobin: 13.7 g/dL (ref 13.0–17.0)
MCH: 30.2 pg (ref 26.0–34.0)
MCHC: 32.2 g/dL (ref 30.0–36.0)
MCV: 93.8 fL (ref 80.0–100.0)
Platelets: 155 10*3/uL (ref 150–400)
RBC: 4.54 MIL/uL (ref 4.22–5.81)
RDW: 12.2 % (ref 11.5–15.5)
WBC: 6.7 10*3/uL (ref 4.0–10.5)
nRBC: 0 % (ref 0.0–0.2)

## 2021-08-20 NOTE — Progress Notes (Signed)
PCP: Pcp, No Cardiology: Dr. Wyline Mood HF Cardiology: Dr. Shirlee Latch  55 y.o. with history of nonischemic cardiomyopathy and chronic atrial fibrillation was referred by Dr. Wyline Mood for CHF evaluation.  Patient was diagnosed with CHF about 10 years ago.  He has had persistently low EF.  Most recent echo in 2/19 showed EF 20-25%.  Cath in 1/16 showed no significant CAD.  He has a [employer] school [employer] CRT-D device.  He has been in chronic atrial fibrillation for years now.  He had ablation x 2 with breakthrough AF, also failed Tikosyn and amiodarone. 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  No ETOH or smoking.    Echo (12/21) showed EF 20-25%, severe LV dilation, mild LVH, moderate RV enlargement with moderately decreased systolic function.  Echo in 5/22 showed EF < 20%, severe LV dilation, mild RV enlargement with normal RV function, mild-moderate MR, dilated IVC.   Patient was enrolled in ANTHEM trial but is in the usual treatment arm so did not get a device.   CPX in 7/22 showed mild-moderate HF limitation.  Echo in 10/22 showed EF 20%, moderate LV enlargement, moderate RV dysfunction, mild MR.    Patient returns for followup of CHF.  He has been working full time.  Up and down ladders, carrying loads.  Minimal exertional dyspnea.  Weight is down about 11 lbs.  No orthopnea/PND.  No chest pain.  No BRBPR/melena.  Unable to tolerate CPAP.     Labs (6/21): TSH normal, hgb 13.6, creatinine 0.97, LDL 97 Labs (3/22): K 5.1, creatinine 1.14 Labs (8/22): K 4.3, creatinine 1.13  St Jude device interrogation: 95% BiV pacing, stable thoracic impedance.   PMH: 1. Atrial fibrillation: Chronic. He has had ablation x 2 with breakthrough atrial fibrillation. He failed Tikosyn and amiodarone.  2. H/o VT 3. OSA: Tries to use CPAP.  4. Obesity 5. Chronic systolic CHF: Nonischemic cardiomyopathy. St Jude CRT-D device.  - Echo (2013): EF 40%.  - Echo (1/16): EF 15-20%.  - LHC (1/16): Normal  coronaries.  - Echo (2/19): EF 20-25% - Echo (12/21): EF 20-25%, severe LV dilation, mild LVH, moderate RV enlargement with moderately decreased systolic function.  - CPX (12/21): RER 1.18, VE/VCO2 slope 29, peak VO2 13.1.  No significant HF limitation, primarily limited by deconditioning.  - Echo (5/22): EF < 20%, severe LV dilation, mild RV enlargement with normal RV function, mild-moderate MR, dilated IVC.  - ANTHEM trial in usual treatment arm.  - CPX (7/22): peak VO2 12.6, VE/VCO2 slope 31, RER 1.2.  Mild-moderate HF limitation, limited also by obesity/body habitus.  - Echo (10/22): EF 20%, moderate LV enlargement, moderate RV dysfunction, mild MR.  FH: Grandfather died of MI at 73.  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   ROS: All systems reviewed and negative except as per HPI.   Current Outpatient Medications  Medication Sig Dispense Refill   acetaminophen (TYLENOL) 500 MG tablet Take 1,000 mg by mouth daily as needed for mild pain or headache.      ENTRESTO 97-103 MG TAKE 1 TABLET BY MOUTH TWICE DAILY 60 tablet 6   furosemide (LASIX) 40 MG tablet Take 1 tablet (40 mg total) by mouth 2 (two) times daily. 180 tablet 3   metoprolol succinate (TOPROL-XL) 100 MG 24 hr tablet TAKE (1) TABLET BY MOUTH TWICE A DAY AFTER MEALS (BREAKFAST AND SUPPER). 60 tablet 6   rivaroxaban (XARELTO) 20 MG TABS tablet TAKE (  1) TABLET BY MOUTH DAILY WITH SUPPER. 30 tablet 5   spironolactone (ALDACTONE) 25 MG tablet Take 1 tablet (25 mg total) by mouth daily. 90 tablet 3   No current facility-administered medications for this encounter.   BP 90/66   Pulse 74   Wt (!) 138.5 kg (305 lb 6.4 oz)   SpO2 99%   BMI 41.42 kg/m  General: NAD Neck: No JVD, no thyromegaly or thyroid nodule.  Lungs: Clear to auscultation bilaterally with normal respiratory effort. CV: Nondisplaced PMI.  Heart regular S1/S2, no S3/S4, no murmur.  No peripheral edema.  No carotid  bruit.  Normal pedal pulses.  Abdomen: Soft, nontender, no hepatosplenomegaly, no distention.  Skin: Intact without lesions or rashes.  Neurologic: Alert and oriented x 3.  Psych: Normal affect. Extremities: No clubbing or cyanosis.  HEENT: Normal.   Assessment/Plan: 1. Chronic systolic CHF: Nonischemic cardiomyopathy x years.  St Jude BiV ICD.  Cath in 1/16 with no significant CAD.  Most recent echo in 10/22 with EF 20%.  CPX in 7/22 showed mild-moderate HF limitation.  Cause of cardiomyopathy uncertain, ?viral myocarditis.  He does not have a strong family history of cardiomyopathy.  His pacemaker is not compatible with cardiac MRI.  He is in chronic atrial fibrillation but BiV pacing percentage has been > 95% chronically.  NYHA class II, not volume overloaded. BP is low but no lightheadedness.  SBP generally runs 90s-100s.    - Continue Toprol XL 100 mg bid.  - Continue Entresto 97/103 bid. - Continue spironolactone 25 mg daily.  - Unable to tolerate SGLT2 inhibitor due to yeast infections.  May try again after he has lost some weight.  - Continue Lasix 40 mg bid.  BMET today.  - In ANTHEM trial but did not randomize to the device.  2. Atrial fibrillation: Chronic, he has failed ablation x 2, Tikosyn, and amiodarone.   Plan for rate-control.  - Continue Toprol XL.  - Continue Xarelto 20 mg daily. CBC today.  3. Obesity: I will refer to pharmacy clinic for semaglutide.   Followup in 4 months with APP.   Marca Ancona 08/20/2021

## 2021-08-20 NOTE — Patient Instructions (Signed)
Labs done today, your results will be available in MyChart, we will contact you for abnormal readings.  You have been referred to the Pharmacy Clinic at [facility] for weight loss medication, they will call you   Your physician recommends that you schedule a follow-up appointment in: 4 months  If you have any questions or concerns before your next appointment please send [name] a message through [place] or call our office at [phone].    TO LEAVE A MESSAGE FOR THE NURSE SELECT OPTION 2, PLEASE LEAVE A MESSAGE INCLUDING: YOUR NAME DATE OF BIRTH CALL BACK NUMBER REASON FOR CALL**this is important as we prioritize the call backs  YOU WILL RECEIVE A CALL BACK THE SAME DAY AS LONG AS YOU CALL BEFORE 4:00 PM  At the [facility], you and your health needs are our priority. As part of our continuing mission to provide you with exceptional heart care, we have created designated Provider Care Teams. These Care Teams include your primary Cardiologist (physician) and Advanced Practice Providers (APPs- Physician Assistants and Nurse Practitioners) who all work together to provide you with the care you need, when you need it.   You may see any of the following providers on your designated Care Team at your next follow up: Dr Arvilla Meres Dr Carron Curie, NP Robbie Lis, [place] [facility] [place], [place] Karle Plumber, PharmD   Please be sure to bring in all your medications bottles to every appointment.

## 2021-08-20 NOTE — Telephone Encounter (Signed)
-----   Message from Noralee Space, RN sent at 08/20/2021  4:28 PM EDT ----- Per Dr Shirlee Latch pt needs wt loss medication, thanks

## 2021-08-25 NOTE — Telephone Encounter (Addendum)
Received referral from Dr. Shirlee Latch to start semaglutide for weight loss. Patient meets FDA approved criteria with BMI 41.42. Obesity complicated by chronic medical conditions including CAD, HF, OSA, HTN, HLD. No exclusions to use per chart review. It appears he has Medicare Part A & B but a commercial medication plan. Medicare does not cover obesity medications but some commercial plans do. If patient is interested in starting semaglutide, will submit PA for [facility] to see if his plan covers it.   Called the patient to discuss interest. Confirmed with him over the phone there is no personal/family history of medullary thyroid carcinoma. Discussed semaglutide efficacy in weight loss, possible adverse effects, injectable administration, and dose titration schedule. He is not interested in starting it at this time because he wants to focus on losing weight with lifestyle first. He and his wife are doing weight watchers together and he has lost 18 lbs. Congratulated him on this and encouraged him to keep up the good work with lifestyle modifications. Counseled him to reach out if he reaches a plateau with this weight loss and would like to reconsider starting semaglutide. He thanks me for the information and time in calling him today.

## 2021-09-09 NOTE — Research (Signed)
Late entry:  All inclusion/exclusion were reviewed by MD at this time.  Subject meets criteria to proceed   YES __X__    NO _______

## 2021-09-24 ENCOUNTER — Other Ambulatory Visit (HOSPITAL_COMMUNITY): Payer: Self-pay | Admitting: Cardiology

## 2021-11-02 ENCOUNTER — Other Ambulatory Visit: Payer: Self-pay

## 2021-11-02 ENCOUNTER — Ambulatory Visit (HOSPITAL_COMMUNITY)
Admission: RE | Admit: 2021-11-02 | Discharge: 2021-11-02 | Disposition: A | Discharge status: Home / Self Care | Payer: Medicare Other | Source: Ambulatory Visit | Attending: Cardiology | Admitting: Cardiology

## 2021-11-02 ENCOUNTER — Other Ambulatory Visit (HOSPITAL_COMMUNITY): Payer: Medicare Other

## 2021-11-02 VITALS — BP 85/56 | HR 70 | Temp 98.2°F | Resp 16 | Wt 306.0 lb

## 2021-11-02 DIAGNOSIS — I5042 Chronic combined systolic (congestive) and diastolic (congestive) heart failure: Secondary | ICD-10-CM

## 2021-11-02 DIAGNOSIS — Z006 Encounter for examination for normal comparison and control in clinical research program: Secondary | ICD-10-CM

## 2021-11-02 DIAGNOSIS — I5022 Chronic systolic (congestive) heart failure: Secondary | ICD-10-CM

## 2021-11-02 NOTE — Research (Addendum)
Visits Date: 02-Nov-2021 9 Month  Randomization #                                                     SCR#    Visit Date    11/02/21            Echo   Was the Echo acquired? [x]  Yes (Complete Below) []  No (Complete Protocol Deviation)   Was the assessment collected on the same date as the Visit Date?   [x]  Yes []  No     If No - Date of Echo (dd/MMM/yyyy):       Send the Echo to the Core Lab as soon after acquisition as possible.      Biomarkers   Were the blood samples drawn for the biomarkers? [x]  Yes (Complete Below) []  No (Complete Protocol Deviation)   Was the assessment collected on the same date as the Visit Date? [x]  Yes []  No   If no - Date blood samples drawn (dd/MMM/yyyy):      Were all of the biomarker samples shipped to the lab?   [x]  Yes (Complete Below) []  No (Complete Protocol Deviation)   Date blood samples shipped (dd/MMM/yyyy): 09/Jan/2023      Were all the samples viable for analysis:   [x]  Yes []  No (Complete Protocol Deviation)     MD reviewed all labs: Yes [x]    No []         6 Minute Walk 6 Minute Walk  Was the 6 minute walk performed? [x]  Yes []  No (Complete Protocol Deviation)   Date and Time of 6-minute walk (dd/MMM/yyyy [id number] clock):  11/02/21          [id number]      See worksheet for results     24 Hour Holter   Was the 24 Hour Holter applied? [x]  Yes (Complete Below) []  No (Complete Protocol Deviation)   Was the assessment collected on the same date as the Visit Date? [x]  Yes []  No   If no - Date 24 Hour Holter applied (dd/MMM/yyyy):     Was the 24 Hour Holter card uploaded or sent to the core lab?  [x]  Yes (Complete Below) []  No (Complete Protocol Deviation)    Date 24 Hour Holter uploaded or sent to the core lab (dd/MMM/yyyy): 11/05/2021     Was there confirmation from the Holter core lab that the data was interpretable? Please do not erase card  until after confirmation the data were received and interpretable   [x]  Yes []  No (Complete Protocol Deviation)        KCCQ  Use subject completed source. Template is also in regulatory binder.  Was the KCCQ administered and completed? [x]  Yes (Complete Below) []  No (Complete Protocol Deviation)   Was the assessment collected on the same date as the Visit Date? [x]  Yes []  No   If no - Date KCCQ administered (dd/MMM/yyyy):     Was the KCCQ  administered In Person (at the site) or At Home (remotely)? [x]  In Person []  At Home     See work sheet in patient binder. Used subject completed source.  EQ-5D Was the EQ-5D collected:   [x]  Yes (Complete Below) []  No (Complete Protocol Deviation)  Was the assessment administered on the same date as the Visit Date? [x]  Yes []  No  Date EQ-5D administered 09/Jan/2023        DD                  MMM                             YYYY   Was the EQ-5D administered In Person (at the site) or At Home (remotely)? [x]  In Person []  At Home      See work sheet in patient binder. Used subject completed source.                                                        Cardiopulmonary Exam  Was the CP Exam Performed? [x]  Yes (Complete Below) []  No (Complete Protocol Deviation)   Was the assessment collected on the same date as the Visit Date?  Date CP Exam Performed (dd/MMM/yyyy):  [x]  Yes []  No      Heart Sounds  S3: [x]  Absent []  Present   S4: [x]  Absent []  Present  Murmur  Murmur: []  Yes [x]  No   If Yes:  []  Systolic []  Diastolic   If yes select grade: []  I/VI []  II/VI []  III/VI []  IV/VI []  V/VI []  VI/VI   Chest Sounds  Rales: []  Yes [x]  No   If yes check one: []  ? 1/3 of lung fields full []  1/2 of lung fields full []  > 1/2 of lung fields full  If yes check one: []  Left []  Right []  Bilateral    Edema   Edema (pedal): []  Absent [x]  Trace []  1+ (slight) []  2+ (moderate) []  3+ (severe)     Hepatomegaly  Hepatomegaly: [x]  None []  < 2 cm []  2-4 cm []  > 4 cm  Peripheral Pulses  Peripheral Pulses: [x]  Normal []  Abnormal   If Abnormal, explain:        Physical Exam    Physical Examination      Was the physical exam performed?    Was the assessment collected on the same date as the Visit Date?    If No - Date of Echo (dd/MMM/yyyy):    General Appearance     If abnormal, clinically significant?   Describe Clinically Relevant abnormalities:    Ophthalmologic     If abnormal, clinically significant?   Describe Clinically Relevant abnormalities:     HEENT     If abnormal, clinically significant?   Describe Clinically Relevant abnormalities:     Neurologic     If abnormal, clinically significant?  Describe Clinically Relevant abnormalities:     Gastrointestinal     If abnormal, clinically significant?   Describe Clinically Relevant abnormalities:    Dermatologic     If abnormal, clinically significant?  Describe Clinically Relevant abnormalities:     Musculoskeletal    If abnormal, clinically significant?    Describe Clinically Relevant abnormalities:         Other [x]  Yes []  No (complete a protocol deviation)   [x]  Yes []  No             [x]  Normal []   Abnormal []  Not Done  []  Yes []  No         [x]  Normal []  Abnormal []  Not Done  []  Yes []  No           [x]  Normal []  Abnormal []  Not Done  []  Yes []  No            [x] Normal  [] Abnormal  [] Not Done   [] Yes  [] No            [x] Normal  [] Abnormal  [] Not Done      [] Yes  [] No        [x] Normal  [] Abnormal  [] Not Done    [] Yes  [] No         [x] Normal  [] Abnormal  [] Not Done    [] Yes  [] No               [] Normal  [] Abnormal  [] Not Done   [] Yes  [] No           Other, specify:      Result:     If abnormal,  clinically significant?    Describe Clinically Relevant abnormalities     []  Normal []  Abnormal []  Not Done  []  Yes []  No               Con Meds/AEs  Con Meds/AEs   Did the subject experience any Concomitant Medication Changes, Adverse Events, Serious Adverse Events, Adverse Device Events, Device Malfunctions, Hospital Admissions, or HF Hospitalization Equivalent visits since time of consent?  []  Yes (Complete appropriate worksheet and         for  [name] sites, collect billing information)  [x]  No       Health Status Question   Were the question asked   [x]  Yes  []  No  Was the assessment collected on the same date as the Visit date?  Date (dd/MMM/yyyy): [x]  Yes  []  No  Individual(s) questioned: check all that apply) [x]  Patient []  Caregiver/Carer [] Family Members []  Other individuals accompanying patient  Since you last visit here or the last time that we spoke with one another, have any of the following happened for the first time, or worsened, that we have not talked about previously? (check all that apply)  [] Coughing or Wheezing [] Chest pain []  Difficulty concentrating or decreased alertness [] fatigue and weakness []  Sensation of rapid or irregular heartbeat []  More difficulty getting you chores done or taking care        of your self [] More shortness of breath  []  Lightheadness or fainting  [] Shortness of breath when you are physically active or       when you lie down []  Swelling in your legs, ankles or feet, fluid retention,      swelling around your stomach []   Water retention [x]  None of the above   Did you see a health care provider for any of these or other urgent reason []  Yes  [x]  No  If yes, Where? []  Clinic or office []  Emergency room, []  Urgent care  []  Hospital (stayed one or more nights) []    If yes, what kind of health care provider did you see? []  Emergency or urgent care  []  Family doctor, general  practitioner [] Cardiologist (or heart failure doctor) []  Nurse  []  pharmacy  []  other    Other, specify    Since you last visit here or the last time that we spoke with one another, have you had any Concomitant Medication Changes? []   Yes ( Modify the appropriate Concomitant Medication form in the casebook) [x]  No  Since you last visit here or the last time that we spoke with the subject, has the subject had any Adverse Events or Serious Adverse Events not captured above? []  Yes (create AE/SAE/Device Malfunction form as        needed) []  No [x]  Not applicable  Since you last visit here or the last time that we spoke with one another have there been any other changes in how you ( the patient) have been feeling, or a health problem that we have not talking about?   IF yes, list:

## 2021-11-02 NOTE — Addendum Note (Signed)
Addended by: Rubie Maid B on: 11/02/2021 11:45 AM   Modules accepted: Orders

## 2021-11-02 NOTE — Progress Notes (Signed)
°  Echocardiogram 2D Echocardiogram has been performed.  Johny Chess 11/02/2021, 10:04 AM

## 2021-11-03 ENCOUNTER — Ambulatory Visit (INDEPENDENT_AMBULATORY_CARE_PROVIDER_SITE_OTHER): Payer: Medicare Other

## 2021-11-03 DIAGNOSIS — I5022 Chronic systolic (congestive) heart failure: Secondary | ICD-10-CM

## 2021-11-03 DIAGNOSIS — I429 Cardiomyopathy, unspecified: Secondary | ICD-10-CM

## 2021-11-03 LAB — CUP PACEART REMOTE DEVICE CHECK
Battery Remaining Longevity: 34 mo
Battery Remaining Percentage: 38 %
Battery Voltage: 2.92 V
Date Time Interrogation Session: 20230110020026
HighPow Impedance: 66 Ohm
HighPow Impedance: 66 Ohm
Implantable Lead Implant Date: 20160629
Implantable Lead Implant Date: 20160629
Implantable Lead Implant Date: 20180416
Implantable Lead Location: 753858
Implantable Lead Location: 753859
Implantable Lead Location: 753860
Implantable Lead Model: 7122
Implantable Pulse Generator Implant Date: 20180416
Lead Channel Impedance Value: 380 Ohm
Lead Channel Impedance Value: 410 Ohm
Lead Channel Impedance Value: 530 Ohm
Lead Channel Pacing Threshold Amplitude: 0.5 V
Lead Channel Pacing Threshold Amplitude: 0.5 V
Lead Channel Pacing Threshold Pulse Width: 0.5 ms
Lead Channel Pacing Threshold Pulse Width: 0.5 ms
Lead Channel Sensing Intrinsic Amplitude: 0.2 mV
Lead Channel Sensing Intrinsic Amplitude: 6.3 mV
Lead Channel Setting Pacing Amplitude: 2 V
Lead Channel Setting Pacing Amplitude: 2 V
Lead Channel Setting Pacing Pulse Width: 0.5 ms
Lead Channel Setting Pacing Pulse Width: 0.5 ms
Lead Channel Setting Sensing Sensitivity: 0.5 mV
Pulse Gen Serial Number: 7413271

## 2021-11-04 DIAGNOSIS — Z006 Encounter for examination for normal comparison and control in clinical research program: Secondary | ICD-10-CM

## 2021-11-04 LAB — ECHOCARDIOGRAM COMPLETE
MV M vel: 3.56 m/s
MV Peak grad: 50.7 mmHg
S' Lateral: 6.4 cm
Single Plane A4C EF: 21.9 %

## 2021-11-04 NOTE — Progress Notes (Addendum)
PCP: Pcp, No Cardiology: Dr. Harl Bowie HF Cardiology: Dr. Aundra Dubin  56 y.o. with history of nonischemic cardiomyopathy and chronic atrial fibrillation was referred by Dr. Harl Bowie for CHF evaluation.  Patient was diagnosed with CHF about 10 years ago.  He has had persistently low EF.  Most recent echo in 2/19 showed EF 20-25%.  Cath in 1/16 showed no significant CAD.  He has a Research officer, political party CRT-D device.  He has been in chronic atrial fibrillation for years now.  He had ablation x 2 with breakthrough AF, also failed Tikosyn and amiodarone. 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  No ETOH or smoking.    Echo (12/21) showed EF 20-25%, severe LV dilation, mild LVH, moderate RV enlargement with moderately decreased systolic function.  Echo in 5/22 showed EF < 20%, severe LV dilation, mild RV enlargement with normal RV function, mild-moderate MR, dilated IVC.   Patient was enrolled in ANTHEM trial but is in the usual treatment arm so did not get a device.   CPX in 7/22 showed mild-moderate HF limitation.  Echo in 10/22 showed EF 20%, moderate LV enlargement, moderate RV dysfunction, mild MR.    Last seen for follow-up 10/22. Volume looked good. Referred to Pharmacy for semaglutide to assist with weight loss. Ultimately decided he wanted to work on weight loss with lifestyle measures before considering medications.   Had research visit 01/09. BNP elevated at 4790 (3573 in 10/22).   Echo 01/23: EF 20%, RV moderately reduced, biatrial enlargement, moderate MR/TR, dilated IVC  Follow-up scheduled d/t concerns for volume overload. Reports abdominal distension for several days. This is where he tends to accumulate fluid. No worsening dyspnea, orthopnea, PND or leg edema. Baseline weight 302-306 lb, up to 308 lb today. Has been taking all medications as prescribed. Admits to some indiscretions with sodium intake over the holidays.   SBP tends to be 90s. Feels okay with this. Occasional  lightheadedness with position changes.  Wife is participating in [employer] and has lost 30 lbs. He is following the plan along with her to try to lose some weight.  St. Jude device interrogation: 94% BiV pacing,  thoracic impedance trending below threshold   PMH: 1. Atrial fibrillation: Chronic. He has had ablation x 2 with breakthrough atrial fibrillation. He failed Tikosyn and amiodarone.  2. H/o VT 3. OSA: Tries to use CPAP.  4. Obesity 5. Chronic systolic CHF: Nonischemic cardiomyopathy. St Jude CRT-D device.  - Echo (2013): EF 40%.  - Echo (1/16): EF 15-20%.  - LHC (1/16): Normal coronaries.  - Echo (2/19): EF 20-25% - Echo (12/21): EF 20-25%, severe LV dilation, mild LVH, moderate RV enlargement with moderately decreased systolic function.  - CPX (12/21): RER 1.18, VE/VCO2 slope 29, peak VO2 13.1.  No significant HF limitation, primarily limited by deconditioning.  - Echo (5/22): EF < 20%, severe LV dilation, mild RV enlargement with normal RV function, mild-moderate MR, dilated IVC.  - ANTHEM trial in usual treatment arm.  - CPX (7/22): peak VO2 12.6, VE/VCO2 slope 31, RER 1.2.  Mild-moderate HF limitation, limited also by obesity/body habitus.  - Echo (10/22): EF 20%, moderate LV enlargement, moderate RV dysfunction, mild MR. - Echo (01/23): EF 20%, RV moderately reduced, biatrial enlargement, moderate MR/TR, dilated IVC   FH: Grandfather died of MI at 71.  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   ROS: All systems reviewed and negative except as per HPI.  Current Outpatient Medications  Medication Sig Dispense Refill   acetaminophen (TYLENOL) 500 MG tablet Take 1,000 mg by mouth daily as needed for mild pain or headache.      ENTRESTO 97-103 MG TAKE 1 TABLET BY MOUTH TWICE DAILY 60 tablet 6   metoprolol succinate (TOPROL-XL) 100 MG 24 hr tablet TAKE (1) TABLET BY MOUTH TWICE A DAY AFTER MEALS (BREAKFAST AND SUPPER). 60  tablet 6   rivaroxaban (XARELTO) 20 MG TABS tablet TAKE (1) TABLET BY MOUTH DAILY WITH SUPPER. 30 tablet 5   spironolactone (ALDACTONE) 25 MG tablet TAKE 1 TABLET BY MOUTH ONCE DAILY. 30 tablet 11   furosemide (LASIX) 40 MG tablet Take 1 tablet (40 mg total) by mouth 2 (two) times daily. May take additional 40 mg tab as needed for swelling or weight gain 220 tablet 3   No current facility-administered medications for this encounter.   BP 90/66    Pulse 74    Wt (!) 139.8 kg (308 lb 3.2 oz)    SpO2 99%    BMI 41.80 kg/m  General:  No distress. Walked into clinic. HEENT: normal Neck: supple. JVP 10 cm. Carotids 2+ bilat; no bruits. No lymphadenopathy or thryomegaly appreciated. Cor: PMI nondisplaced. Regular rate & rhythm. No rubs, gallops, 2/6 HSM. Lungs: clear Abdomen: soft, nontender, + distended. No hepatosplenomegaly. No bruits or masses. Good bowel sounds. Extremities: no cyanosis, clubbing, rash, trace edema Neuro: alert & orientedx3, cranial nerves grossly intact. moves all 4 extremities w/o difficulty. Affect pleasant    Assessment/Plan: 1. Acute on chronic systolic CHF: Nonischemic cardiomyopathy x years.  St Jude BiV ICD.  Cath in 1/16 with no significant CAD.  CPX in 7/22 showed mild-moderate HF limitation.  Most recent echo 01/23 with EF 20%, RV moderately reduced, moderate TR and MR. Cause of cardiomyopathy uncertain, ?viral myocarditis.  He does not have a strong family history of cardiomyopathy.  His pacemaker is not compatible with cardiac MRI.  He is in chronic atrial fibrillation but BiV pacing percentage 94%.   - NYHA class II. Volume appears up on exam and by Impedance trends. Likely d/t sodium intake around the holidays. - On lasix 40 mg BID. Take an extra 40 mg lasix in am for 3-4 days. Going forward can take extra 40 mg lasix PRN. - BP is low but no significant symptoms.  SBP generally runs 90s-100s.    - Continue Toprol XL 100 mg bid.  - Continue Entresto 97/103  bid. - Continue spironolactone 25 mg daily.  - Unable to tolerate SGLT2 inhibitor due to yeast infections.  May try again after he has lost some weight.   - In ANTHEM trial but did not randomize to the device.  - BMET today and again in 7-10 days 2. Atrial fibrillation: Chronic, he has failed ablation x 2, Tikosyn, and amiodarone.   Plan for rate-control.  - Continue Toprol XL.  - Continue Xarelto 20 mg daily.  - CBC today 3. Obesity: Declined semaglutide - Working on lifestyle measures  Followup: As scheduled in February with Dr. Aundra Dubin  [facility], [place]ia Comment N 11/05/2021

## 2021-11-04 NOTE — Research (Addendum)
Called patient to let him know lab results. Appointment scheduled with HF Clinic tomorrow 1/12 @ 9:30. Pt understands and will be here tomorrow.

## 2021-11-05 ENCOUNTER — Other Ambulatory Visit: Payer: Self-pay

## 2021-11-05 ENCOUNTER — Encounter (HOSPITAL_COMMUNITY): Payer: Self-pay

## 2021-11-05 ENCOUNTER — Ambulatory Visit (HOSPITAL_COMMUNITY)
Admission: RE | Admit: 2021-11-05 | Discharge: 2021-11-05 | Disposition: A | Discharge status: Home / Self Care | Payer: Medicare Other | Source: Ambulatory Visit | Attending: Physician Assistant | Admitting: Physician Assistant

## 2021-11-05 VITALS — BP 90/66 | HR 74 | Wt 308.2 lb

## 2021-11-05 DIAGNOSIS — I5023 Acute on chronic systolic (congestive) heart failure: Secondary | ICD-10-CM | POA: Insufficient documentation

## 2021-11-05 DIAGNOSIS — Z950 Presence of cardiac pacemaker: Secondary | ICD-10-CM | POA: Diagnosis not present

## 2021-11-05 DIAGNOSIS — I482 Chronic atrial fibrillation, unspecified: Secondary | ICD-10-CM | POA: Insufficient documentation

## 2021-11-05 DIAGNOSIS — Z79899 Other long term (current) drug therapy: Secondary | ICD-10-CM | POA: Diagnosis not present

## 2021-11-05 DIAGNOSIS — Z7901 Long term (current) use of anticoagulants: Secondary | ICD-10-CM | POA: Insufficient documentation

## 2021-11-05 DIAGNOSIS — Z6841 Body Mass Index (BMI) 40.0 and over, adult: Secondary | ICD-10-CM | POA: Diagnosis not present

## 2021-11-05 DIAGNOSIS — E669 Obesity, unspecified: Secondary | ICD-10-CM | POA: Insufficient documentation

## 2021-11-05 DIAGNOSIS — I428 Other cardiomyopathies: Secondary | ICD-10-CM | POA: Diagnosis not present

## 2021-11-05 DIAGNOSIS — R140 Abdominal distension (gaseous): Secondary | ICD-10-CM | POA: Diagnosis not present

## 2021-11-05 LAB — CBC
HCT: 39.6 % (ref 39.0–52.0)
Hemoglobin: 12.8 g/dL — ABNORMAL LOW (ref 13.0–17.0)
MCH: 29.6 pg (ref 26.0–34.0)
MCHC: 32.3 g/dL (ref 30.0–36.0)
MCV: 91.5 fL (ref 80.0–100.0)
Platelets: 144 10*3/uL — ABNORMAL LOW (ref 150–400)
RBC: 4.33 MIL/uL (ref 4.22–5.81)
RDW: 12.6 % (ref 11.5–15.5)
WBC: 6.5 10*3/uL (ref 4.0–10.5)
nRBC: 0 % (ref 0.0–0.2)

## 2021-11-05 LAB — BASIC METABOLIC PANEL
Anion gap: 9 (ref 5–15)
BUN: 18 mg/dL (ref 6–20)
CO2: 28 mmol/L (ref 22–32)
Calcium: 10 mg/dL (ref 8.9–10.3)
Chloride: 103 mmol/L (ref 98–111)
Creatinine, Ser: 1 mg/dL (ref 0.61–1.24)
GFR, Estimated: >60 mL/min (ref >60)
Glucose, Bld: 106 mg/dL — ABNORMAL HIGH (ref 70–99)
Potassium: 4.1 mmol/L (ref 3.5–5.1)
Sodium: 140 mmol/L (ref 135–145)

## 2021-11-05 MED ORDER — FUROSEMIDE 40 MG PO TABS: 40.0000 mg | ORAL_TABLET | Freq: Two times a day (BID) | ORAL | 3 refills | Status: DC

## 2021-11-05 NOTE — Patient Instructions (Signed)
Please take an additional 40 mg of Furosemide for 3-4 days You can also repeat this as needed for weight gain or swelling (3lbs overnight or 5 lbs in a week)   Labs today We will only contact you if something comes back abnormal or we need to make some changes. Otherwise no news is good news!  Labs needed in 7-10 days  Keep cardiology follow up as scheduled  Do the following things EVERYDAY: Weigh yourself in the morning before breakfast. Write it down and keep it in a log. Take your medicines as prescribed Eat low salt foods--Limit salt (sodium) to 2000 mg per day.  Stay as active as you can everyday Limit all fluids for the day to less than 2 liters  .At the [facility], you and your health needs are our priority. As part of our continuing mission to provide you with exceptional heart care, we have created designated Provider Care Teams. These Care Teams include your primary Cardiologist (physician) and Advanced Practice Providers (APPs- Physician Assistants and Nurse Practitioners) who all work together to provide you with the care you need, when you need it.   You may see any of the following providers on your designated Care Team at your next follow up: Dr Glori Bickers Dr Haynes Kerns, NP Lyda Jester, [place] [facility] [place], [place] Audry Riles, PharmD   Please be sure to bring in all your medications bottles to every appointment.   If you have any questions or concerns before your next appointment please send [name] a message through [place] or call our office at [phone].    TO LEAVE A MESSAGE FOR THE NURSE SELECT OPTION 2, PLEASE LEAVE A MESSAGE INCLUDING: YOUR NAME DATE OF BIRTH CALL BACK NUMBER REASON FOR CALL**this is important as we prioritize the call backs  YOU WILL RECEIVE A CALL BACK THE SAME DAY AS LONG AS YOU CALL BEFORE 4:00 PM

## 2021-11-13 ENCOUNTER — Ambulatory Visit (HOSPITAL_COMMUNITY)
Admission: RE | Admit: 2021-11-13 | Discharge: 2021-11-13 | Disposition: A | Discharge status: Home / Self Care | Payer: Medicare Other | Source: Ambulatory Visit | Attending: Cardiology | Admitting: Cardiology

## 2021-11-13 ENCOUNTER — Other Ambulatory Visit: Payer: Self-pay

## 2021-11-13 DIAGNOSIS — I5023 Acute on chronic systolic (congestive) heart failure: Secondary | ICD-10-CM | POA: Diagnosis not present

## 2021-11-13 LAB — BASIC METABOLIC PANEL
Anion gap: 7 (ref 5–15)
BUN: 18 mg/dL (ref 6–20)
CO2: 27 mmol/L (ref 22–32)
Calcium: 9.5 mg/dL (ref 8.9–10.3)
Chloride: 102 mmol/L (ref 98–111)
Creatinine, Ser: 1.09 mg/dL (ref 0.61–1.24)
GFR, Estimated: >60 mL/min (ref >60)
Glucose, Bld: 97 mg/dL (ref 70–99)
Potassium: 4.2 mmol/L (ref 3.5–5.1)
Sodium: 136 mmol/L (ref 135–145)

## 2021-11-13 NOTE — Progress Notes (Signed)
Remote ICD transmission.   

## 2021-11-24 ENCOUNTER — Other Ambulatory Visit: Payer: Self-pay | Admitting: Cardiology

## 2021-11-24 NOTE — Telephone Encounter (Signed)
Prescription refill request for Xarelto received.  Indication: Atrial fib Last office visit: 08/20/21  [address] [address] MD Weight: 138.5kg Age: 56 Scr: 1.09 on 11/13/21 CrCl: 151.77  Based on above findings Xarelto 20mg  daily is the appropriate dose.  Refill approved.

## 2021-12-16 NOTE — Progress Notes (Incomplete)
PCP: Pcp, No Cardiology: Dr. Harl Bowie HF Cardiology: Dr. Aundra Dubin  56 y.o. with history of nonischemic cardiomyopathy and chronic atrial fibrillation was referred by Dr. Harl Bowie for CHF evaluation.  Patient was diagnosed with CHF about 10 years ago.  He has had persistently low EF.  Most recent echo in 2/19 showed EF 20-25%.  Cath in 1/16 showed no significant CAD.  He has a Research officer, political party CRT-D device.  He has been in chronic atrial fibrillation for years now.  He had ablation x 2 with breakthrough AF, also failed Tikosyn and amiodarone. 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  No ETOH or smoking.    Echo (12/21) showed EF 20-25%, severe LV dilation, mild LVH, moderate RV enlargement with moderately decreased systolic function.  Echo in 5/22 showed EF < 20%, severe LV dilation, mild RV enlargement with normal RV function, mild-moderate MR, dilated IVC.   Patient was enrolled in ANTHEM trial but is in the usual treatment arm so did not get a device.   CPX in 7/22 showed mild-moderate HF limitation.  Echo in 10/22 showed EF 20%, moderate LV enlargement, moderate RV dysfunction, mild MR.    Last seen for follow-up 10/22. Volume looked good. Referred to Pharmacy for semaglutide to assist with weight loss. Ultimately decided he wanted to work on weight loss with lifestyle measures before considering medications.   Had research visit 01/09. BNP elevated at 4790 (3573 in 10/22).   Echo 01/23: EF 20%, RV moderately reduced, biatrial enlargement, moderate MR/TR, dilated IVC  Follow-up scheduled d/t concerns for volume overload. Reports abdominal distension for several days. This is where he tends to accumulate fluid. No worsening dyspnea, orthopnea, PND or leg edema. Baseline weight 302-306 lb, up to 308 lb today. Has been taking all medications as prescribed. Admits to some indiscretions with sodium intake over the holidays.   SBP tends to be 90s. Feels okay with this. Occasional  lightheadedness with position changes.  Wife is participating in [employer] and has lost 30 lbs. He is following the plan along with her to try to lose some weight.  St. Jude device interrogation: 94% BiV pacing,  thoracic impedance trending below threshold   PMH: 1. Atrial fibrillation: Chronic. He has had ablation x 2 with breakthrough atrial fibrillation. He failed Tikosyn and amiodarone.  2. H/o VT 3. OSA: Tries to use CPAP.  4. Obesity 5. Chronic systolic CHF: Nonischemic cardiomyopathy. St Jude CRT-D device.  - Echo (2013): EF 40%.  - Echo (1/16): EF 15-20%.  - LHC (1/16): Normal coronaries.  - Echo (2/19): EF 20-25% - Echo (12/21): EF 20-25%, severe LV dilation, mild LVH, moderate RV enlargement with moderately decreased systolic function.  - CPX (12/21): RER 1.18, VE/VCO2 slope 29, peak VO2 13.1.  No significant HF limitation, primarily limited by deconditioning.  - Echo (5/22): EF < 20%, severe LV dilation, mild RV enlargement with normal RV function, mild-moderate MR, dilated IVC.  - ANTHEM trial in usual treatment arm.  - CPX (7/22): peak VO2 12.6, VE/VCO2 slope 31, RER 1.2.  Mild-moderate HF limitation, limited also by obesity/body habitus.  - Echo (10/22): EF 20%, moderate LV enlargement, moderate RV dysfunction, mild MR. - Echo (01/23): EF 20%, RV moderately reduced, biatrial enlargement, moderate MR/TR, dilated IVC   FH: Grandfather died of MI at 29.  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   ROS: All systems reviewed and negative except as per HPI.  Current Outpatient Medications  Medication Sig Dispense Refill   acetaminophen (TYLENOL) 500 MG tablet Take 1,000 mg by mouth daily as needed for mild pain or headache.      ENTRESTO 97-103 MG TAKE 1 TABLET BY MOUTH TWICE DAILY 60 tablet 6   furosemide (LASIX) 40 MG tablet Take 1 tablet (40 mg total) by mouth 2 (two) times daily. May take additional 40 mg tab as needed  for swelling or weight gain 220 tablet 3   metoprolol succinate (TOPROL-XL) 100 MG 24 hr tablet TAKE (1) TABLET BY MOUTH TWICE A DAY AFTER MEALS (BREAKFAST AND SUPPER). 60 tablet 6   rivaroxaban (XARELTO) 20 MG TABS tablet TAKE (1) TABLET BY MOUTH DAILY WITH SUPPER. 30 tablet 6   spironolactone (ALDACTONE) 25 MG tablet TAKE 1 TABLET BY MOUTH ONCE DAILY. 30 tablet 11   No current facility-administered medications for this visit.   There were no vitals taken for this visit. General:  No distress. Walked into clinic. HEENT: normal Neck: supple. JVP 10 cm. Carotids 2+ bilat; no bruits. No lymphadenopathy or thryomegaly appreciated. Cor: PMI nondisplaced. Regular rate & rhythm. No rubs, gallops, 2/6 HSM. Lungs: clear Abdomen: soft, nontender, + distended. No hepatosplenomegaly. No bruits or masses. Good bowel sounds. Extremities: no cyanosis, clubbing, rash, trace edema Neuro: alert & orientedx3, cranial nerves grossly intact. moves all 4 extremities w/o difficulty. Affect pleasant    Assessment/Plan: 1. Acute on chronic systolic CHF: Nonischemic cardiomyopathy x years.  St Jude BiV ICD.  Cath in 1/16 with no significant CAD.  CPX in 7/22 showed mild-moderate HF limitation.  Most recent echo 01/23 with EF 20%, RV moderately reduced, moderate TR and MR. Cause of cardiomyopathy uncertain, ?viral myocarditis.  He does not have a strong family history of cardiomyopathy.  His pacemaker is not compatible with cardiac MRI.  He is in chronic atrial fibrillation but BiV pacing percentage 94%.   - NYHA class II. Volume appears up on exam and by Impedance trends. Likely d/t sodium intake around the holidays. - On lasix 40 mg BID. Take an extra 40 mg lasix in am for 3-4 days. Going forward can take extra 40 mg lasix PRN. - BP is low but no significant symptoms.  SBP generally runs 90s-100s.    - Continue Toprol XL 100 mg bid.  - Continue Entresto 97/103 bid. - Continue spironolactone 25 mg daily.  - Unable  to tolerate SGLT2 inhibitor due to yeast infections.  May try again after he has lost some weight.   - In ANTHEM trial but did not randomize to the device.  - BMET today and again in 7-10 days 2. Atrial fibrillation: Chronic, he has failed ablation x 2, Tikosyn, and amiodarone.   Plan for rate-control.  - Continue Toprol XL.  - Continue Xarelto 20 mg daily.  - CBC today 3. Obesity: Declined semaglutide - Working on lifestyle measures  Followup: As scheduled in February with Dr. Wynema Birch [facility] 12/16/2021

## 2021-12-21 ENCOUNTER — Encounter (HOSPITAL_COMMUNITY): Payer: Medicare Other

## 2022-01-01 ENCOUNTER — Telehealth (HOSPITAL_COMMUNITY): Payer: Self-pay | Admitting: Surgery

## 2022-01-01 ENCOUNTER — Encounter (HOSPITAL_COMMUNITY): Payer: Self-pay

## 2022-01-01 ENCOUNTER — Ambulatory Visit (HOSPITAL_COMMUNITY)
Admission: RE | Admit: 2022-01-01 | Discharge: 2022-01-01 | Disposition: A | Discharge status: Home / Self Care | Payer: Medicare Other | Source: Ambulatory Visit | Attending: Family Medicine | Admitting: Family Medicine

## 2022-01-01 ENCOUNTER — Other Ambulatory Visit: Payer: Self-pay

## 2022-01-01 VITALS — BP 98/62 | HR 71 | Wt 303.2 lb

## 2022-01-01 DIAGNOSIS — Z950 Presence of cardiac pacemaker: Secondary | ICD-10-CM | POA: Insufficient documentation

## 2022-01-01 DIAGNOSIS — I482 Chronic atrial fibrillation, unspecified: Secondary | ICD-10-CM | POA: Diagnosis not present

## 2022-01-01 DIAGNOSIS — Z95810 Presence of automatic (implantable) cardiac defibrillator: Secondary | ICD-10-CM | POA: Insufficient documentation

## 2022-01-01 DIAGNOSIS — E669 Obesity, unspecified: Secondary | ICD-10-CM | POA: Insufficient documentation

## 2022-01-01 DIAGNOSIS — I5022 Chronic systolic (congestive) heart failure: Secondary | ICD-10-CM | POA: Insufficient documentation

## 2022-01-01 DIAGNOSIS — I428 Other cardiomyopathies: Secondary | ICD-10-CM | POA: Insufficient documentation

## 2022-01-01 DIAGNOSIS — I5040 Unspecified combined systolic (congestive) and diastolic (congestive) heart failure: Secondary | ICD-10-CM

## 2022-01-01 DIAGNOSIS — Z79899 Other long term (current) drug therapy: Secondary | ICD-10-CM | POA: Insufficient documentation

## 2022-01-01 DIAGNOSIS — Z6841 Body Mass Index (BMI) 40.0 and over, adult: Secondary | ICD-10-CM | POA: Insufficient documentation

## 2022-01-01 DIAGNOSIS — Z7901 Long term (current) use of anticoagulants: Secondary | ICD-10-CM | POA: Insufficient documentation

## 2022-01-01 LAB — BASIC METABOLIC PANEL
Anion gap: 12 (ref 5–15)
BUN: 24 mg/dL — ABNORMAL HIGH (ref 6–20)
CO2: 24 mmol/L (ref 22–32)
Calcium: 9 mg/dL (ref 8.9–10.3)
Chloride: 103 mmol/L (ref 98–111)
Creatinine, Ser: 1.35 mg/dL — ABNORMAL HIGH (ref 0.61–1.24)
GFR, Estimated: >60 mL/min (ref >60)
Glucose, Bld: 94 mg/dL (ref 70–99)
Potassium: 3.7 mmol/L (ref 3.5–5.1)
Sodium: 139 mmol/L (ref 135–145)

## 2022-01-01 MED ORDER — FUROSEMIDE 40 MG PO TABS: 40.0000 mg | ORAL_TABLET | Freq: Two times a day (BID) | ORAL | 3 refills | Status: DC

## 2022-01-01 NOTE — Progress Notes (Signed)
PCP: Pcp, No ?Cardiology: Dr. Harl Bowie ?HF Cardiology: Dr. Aundra Dubin ? ?56 y.o. with history of nonischemic cardiomyopathy and chronic atrial fibrillation was referred by Dr. Harl Bowie for CHF evaluation.  Patient was diagnosed with CHF about 10 years ago.  He has had persistently low EF.  Most recent echo in 2/19 showed EF 20-25%.  Cath in 1/16 showed no significant CAD.  He has a Research officer, political party CRT-D device.  He has been in chronic atrial fibrillation for years now.  He had ablation x 2 with breakthrough AF, also failed Tikosyn and amiodarone. 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  No ETOH or smoking.   ? ?Echo (12/21) showed EF 20-25%, severe LV dilation, mild LVH, moderate RV enlargement with moderately decreased systolic function.  Echo in 5/22 showed EF < 20%, severe LV dilation, mild RV enlargement with normal RV function, mild-moderate MR, dilated IVC.  ? ?Patient was enrolled in ANTHEM trial but is in the usual treatment arm so did not get a device.  ? ?CPX in 7/22 showed mild-moderate HF limitation.  Echo in 10/22 showed EF 20%, moderate LV enlargement, moderate RV dysfunction, mild MR.   ? ?Had research visit 11/02/21. BNP elevated at 4790 (3573 in 10/22).  ? ?Echo 01/23: EF 20%, RV moderately reduced, biatrial enlargement, moderate MR/TR, dilated IVC. ? ?Today he returns for HF follow up with his wife. Overall feeling fine. Fells belly is more bloated and took extra last last week. In process of moving homes and eating out for most meals. Some SOB walking on flat ground and with stairs, but no dyspnea lifting. Continues with positional dizziness but no falls. Denies abnormal bleeding, palpitations, CP, edema, or PND/Orthopnea. Appetite ok. No fever or chills. Not weighing at home. Cannot tolerate CPAP. Taking all medications.   ? ?St. Jude device interrogation: CorVue stable, daily impedence up but looks like it is now trending back down, 92% BiV pacing. ? ?ReDs: 31% ? ?Labs (1/23): K 4.2,  creatinine 1.09 ? ?PMH: ?1. Atrial fibrillation: Chronic. He has had ablation x 2 with breakthrough atrial fibrillation. He failed Tikosyn and amiodarone.  ?2. H/o VT ?3. OSA: Tries to use CPAP.  ?4. Obesity ?5. Chronic systolic CHF: Nonischemic cardiomyopathy. St Jude CRT-D device.  ?- Echo (2013): EF 40%.  ?- Echo (1/16): EF 15-20%.  ?- LHC (1/16): Normal coronaries.  ?- Echo (2/19): EF 20-25% ?- Echo (12/21): EF 20-25%, severe LV dilation, mild LVH, moderate RV enlargement with moderately decreased systolic function.  ?- CPX (12/21): RER 1.18, VE/VCO2 slope 29, peak VO2 13.1.  No significant HF limitation, primarily limited by deconditioning.  ?- Echo (5/22): EF < 20%, severe LV dilation, mild RV enlargement with normal RV function, mild-moderate MR, dilated IVC.  ?- ANTHEM trial in usual treatment arm.  ?- CPX (7/22): peak VO2 12.6, VE/VCO2 slope 31, RER 1.2.  Mild-moderate HF limitation, limited also by obesity/body habitus.  ?- Echo (10/22): EF 20%, moderate LV enlargement, moderate RV dysfunction, mild MR. ?- Echo (01/23): EF 20%, RV moderately reduced, biatrial enlargement, moderate MR/TR, dilated IVC ? ?FH: Grandfather died of MI at 12. ? 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  ? ?ROS: All systems reviewed and negative except as per HPI.  ? ?Current Outpatient Medications  ?Medication Sig Dispense Refill  ? acetaminophen (TYLENOL) 500 MG tablet Take 1,000 mg by mouth daily as needed for mild pain or headache.     ?  ENTRESTO 97-103 MG TAKE 1 TABLET BY MOUTH TWICE DAILY 60 tablet 6  ? furosemide (LASIX) 40 MG tablet Take 1 tablet (40 mg total) by mouth 2 (two) times daily. May take additional 40 mg tab as needed for swelling or weight gain 220 tablet 3  ? metoprolol succinate (TOPROL-XL) 100 MG 24 hr tablet TAKE (1) TABLET BY MOUTH TWICE A DAY AFTER MEALS (BREAKFAST AND SUPPER). 60 tablet 6  ? rivaroxaban (XARELTO) 20 MG TABS tablet TAKE (1) TABLET BY MOUTH DAILY  WITH SUPPER. 30 tablet 6  ? spironolactone (ALDACTONE) 25 MG tablet TAKE 1 TABLET BY MOUTH ONCE DAILY. 30 tablet 11  ? ?No current facility-administered medications for this encounter.  ? ?Wt Readings from Last 3 Encounters:  ?01/01/22 (!) 137.5 kg (303 lb 3.2 oz)  ?11/05/21 (!) 139.8 kg (308 lb 3.2 oz)  ?11/02/21 (!) 138.8 kg (306 lb)  ? ?BP 98/62   Pulse 71   Wt (!) 137.5 kg (303 lb 3.2 oz)   SpO2 99%   BMI 41.12 kg/m?  ?General:  NAD. No resp difficulty ?HEENT: Normal ?Neck: Supple. JVP 6-7. Carotids 2+ bilat; no bruits. No lymphadenopathy or thryomegaly appreciated. ?Cor: PMI nondisplaced. Regular rate & rhythm. No rubs, gallops, murmurs. ?Lungs: Clear ?Abdomen: Obese, nontender, nondistended. No hepatosplenomegaly. No bruits or masses. Good bowel sounds. ?Extremities: No cyanosis, clubbing, rash, edema ?Neuro: Alert & oriented x 3, cranial nerves grossly intact. Moves all 4 extremities w/o difficulty. Affect pleasant. ? ?Assessment/Plan: ?1. Chronic systolic CHF: Nonischemic cardiomyopathy x years.  St Jude BiV ICD.  Cath in 1/16 with no significant CAD.  CPX in 7/22 showed mild-moderate HF limitation.  Most recent echo 01/23 with EF 20%, RV moderately reduced, moderate TR and MR. Cause of cardiomyopathy uncertain, ?viral myocarditis.  He does not have a strong family history of cardiomyopathy.  His pacemaker is not compatible with cardiac MRI.  He is in chronic atrial fibrillation but BiV pacing percentage 94%.  Stable NYHA II symptoms, he is not not volume overloaded on exam, or by CorVue but looks like impendence may be trending back down suggesting possible fluid. ?- Continue Lasix 40 mg bid, can take extra 20 mg daily for 2-3 days. BMET today.  ?- Continue Toprol XL 100 mg bid.  ?- Continue Entresto 97/103 bid. ?- Continue spironolactone 25 mg daily.  ?- Unable to tolerate SGLT2 inhibitor due to yeast infections.  May try again after he has lost some weight.   ?- In ANTHEM trial but did not randomize  to the device.  ?2. Atrial fibrillation: Chronic, he has failed ablation x 2, Tikosyn, and amiodarone.   Plan for rate-control.  ?- Continue Toprol XL.  ?- Continue Xarelto 20 mg daily. No bleeding issues. ?3. Obesity: Body mass index is 41.12 kg/m?Marland Kitchen Declined semaglutide. ?- Working on lifestyle measures ? ?Follow up with Dr. Aundra Dubin in 3 months. ? ?Rafael Bihari FNP ?01/01/2022 ?

## 2022-01-01 NOTE — Progress Notes (Signed)
ReDS Vest / Clip - 01/01/22 1000   ? ?  ? ReDS Vest / Clip  ? Station Marker D   ? Ruler Value 41   ? ReDS Value Range Low volume   ? ReDS Actual Value 31   ? ?  ?  ? ?  ? ? ?

## 2022-01-01 NOTE — Telephone Encounter (Signed)
-----   Message from Rafael Bihari, [place] sent at 01/01/2022 12:40 PM EST ----- ?Kidney function mildly elevated. Do not increase Lasix as advised during visit today. ?

## 2022-01-01 NOTE — Telephone Encounter (Signed)
Patient called to review results and recommendations.  He is aware to keep dose of lasix as previous.  CHL medication list updated. ?

## 2022-01-01 NOTE — Patient Instructions (Signed)
Medication Changes: ? ?Ok to take extra Furosemide for next couple of days ? ?Lab Work: ? ?Labs done today, your results will be available in MyChart, we will contact you for abnormal readings. ? ?Testing/Procedures: ? ?None ? ?Referrals: ? ?None ? ?Special Instructions // Education: ? ?Do the following things EVERYDAY: ?Weigh yourself in the morning before breakfast. Write it down and keep it in a log. ?Take your medicines as prescribed ?Eat low salt foods--Limit salt (sodium) to 2000 mg per day.  ?Stay as active as you can everyday ?Limit all fluids for the day to less than 2 liters  ? ? ?Follow-Up in: 3 months with Dr Aundra Dubin ? ?At the [facility], you and your health needs are our priority. We have a designated team specialized in the treatment of Heart Failure. This Care Team includes your primary Heart Failure Specialized Cardiologist (physician), Advanced Practice Providers (APPs- Physician Assistants and Nurse Practitioners), and Pharmacist who all work together to provide you with the care you need, when you need it.  ? ?You may see any of the following providers on your designated Care Team at your next follow up: ? ?Dr Glori Bickers ?Dr Loralie Champagne ?Darrick Grinder, NP ?Lyda Jester, PA ?Jessica Milford,NP ?Marlyce Huge, PA ?Audry Riles, PharmD ? ? ?Please be sure to bring in all your medications bottles to every appointment.  ? ?Need to Contact us: ? ?If you have any questions or concerns before your next appointment please send [name] a message through [place] or call our office at [phone].   ? ?TO LEAVE A MESSAGE FOR THE NURSE SELECT OPTION 2, PLEASE LEAVE A MESSAGE INCLUDING: ?YOUR NAME ?DATE OF BIRTH ?CALL BACK NUMBER ?REASON FOR CALL**this is important as we prioritize the call backs ? ?YOU WILL RECEIVE A CALL BACK THE SAME DAY AS LONG AS YOU CALL BEFORE 4:00 PM ? ? ?

## 2022-01-04 DIAGNOSIS — I4891 Unspecified atrial fibrillation: Secondary | ICD-10-CM | POA: Diagnosis not present

## 2022-01-04 DIAGNOSIS — M75102 Unspecified rotator cuff tear or rupture of left shoulder, not specified as traumatic: Secondary | ICD-10-CM | POA: Diagnosis not present

## 2022-01-04 DIAGNOSIS — E669 Obesity, unspecified: Secondary | ICD-10-CM | POA: Diagnosis not present

## 2022-01-04 DIAGNOSIS — G4730 Sleep apnea, unspecified: Secondary | ICD-10-CM | POA: Diagnosis not present

## 2022-01-04 DIAGNOSIS — I10 Essential (primary) hypertension: Secondary | ICD-10-CM | POA: Diagnosis not present

## 2022-01-04 DIAGNOSIS — M5136 Other intervertebral disc degeneration, lumbar region: Secondary | ICD-10-CM | POA: Diagnosis not present

## 2022-01-04 DIAGNOSIS — M720 Palmar fascial fibromatosis [Dupuytren]: Secondary | ICD-10-CM | POA: Diagnosis not present

## 2022-01-04 DIAGNOSIS — Z95810 Presence of automatic (implantable) cardiac defibrillator: Secondary | ICD-10-CM | POA: Diagnosis not present

## 2022-01-04 DIAGNOSIS — Z6841 Body Mass Index (BMI) 40.0 and over, adult: Secondary | ICD-10-CM | POA: Diagnosis not present

## 2022-01-04 DIAGNOSIS — I509 Heart failure, unspecified: Secondary | ICD-10-CM | POA: Diagnosis not present

## 2022-01-04 DIAGNOSIS — Z0189 Encounter for other specified special examinations: Secondary | ICD-10-CM | POA: Diagnosis not present

## 2022-01-13 ENCOUNTER — Telehealth: Payer: Self-pay

## 2022-01-13 DIAGNOSIS — Z0000 Encounter for general adult medical examination without abnormal findings: Secondary | ICD-10-CM | POA: Diagnosis not present

## 2022-01-13 DIAGNOSIS — R7301 Impaired fasting glucose: Secondary | ICD-10-CM | POA: Diagnosis not present

## 2022-01-13 DIAGNOSIS — I10 Essential (primary) hypertension: Secondary | ICD-10-CM | POA: Diagnosis not present

## 2022-01-13 NOTE — Telephone Encounter (Signed)
[facility] alert for sustained VT with ATP. ?Event occurred 3/21 @ 04:25, EGM shows sustained VT, HR 203, ATP delivered x2 converting to AF/BiV pacing. Other events for permanent AF, overall controlled ventricular rates. Xarelto 20mg , Metoprolol, rate control stategy. ? ?Patient reports he was sleeping during this time and does not recall any symptoms or complaints. States he has felt well and denies fluid retention symptoms. Complaint with medications on file. Advised I will forward to Dr. for review and will call if changes are recommended.  ? ? DMV driving restrictions x6 months /shock plan reviewed with patient. Voiced understanding. ? ? ? ? ? ? ?

## 2022-01-19 ENCOUNTER — Encounter: Payer: Self-pay | Admitting: Cardiology

## 2022-01-19 ENCOUNTER — Ambulatory Visit (INDEPENDENT_AMBULATORY_CARE_PROVIDER_SITE_OTHER): Payer: Medicare Other | Admitting: Cardiology

## 2022-01-19 ENCOUNTER — Encounter: Payer: Self-pay | Admitting: *Deleted

## 2022-01-19 VITALS — BP 90/64 | HR 80 | Ht 72.0 in | Wt 300.0 lb

## 2022-01-19 DIAGNOSIS — D6869 Other thrombophilia: Secondary | ICD-10-CM

## 2022-01-19 DIAGNOSIS — I5022 Chronic systolic (congestive) heart failure: Secondary | ICD-10-CM

## 2022-01-19 DIAGNOSIS — I4891 Unspecified atrial fibrillation: Secondary | ICD-10-CM | POA: Diagnosis not present

## 2022-01-19 NOTE — Patient Instructions (Signed)
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
## 2022-01-20 ENCOUNTER — Ambulatory Visit (HOSPITAL_COMMUNITY)
Admission: RE | Admit: 2022-01-20 | Discharge: 2022-01-20 | Disposition: A | Discharge status: Home / Self Care | Payer: Medicare Other | Source: Ambulatory Visit | Attending: Cardiology | Admitting: Cardiology

## 2022-01-20 ENCOUNTER — Other Ambulatory Visit: Payer: Self-pay

## 2022-01-20 VITALS — Temp 98.4°F | Resp 16 | Wt 298.0 lb

## 2022-01-20 DIAGNOSIS — I5022 Chronic systolic (congestive) heart failure: Secondary | ICD-10-CM

## 2022-01-20 DIAGNOSIS — Z006 Encounter for examination for normal comparison and control in clinical research program: Secondary | ICD-10-CM

## 2022-01-20 NOTE — Progress Notes (Signed)
?  Echocardiogram ?2D Echocardiogram has been performed. ? ?Brandon Torres ?01/20/2022, 9:51 AM ?

## 2022-01-20 NOTE — Research (Addendum)
Visits Date: 73 Month  Randomization #                                                     SCR#    Visit Date    01/20/22            Echo   Was the Echo acquired? [x]  Yes (Complete Below) []  No (Complete Protocol Deviation)   Was the assessment collected on the same date as the Visit Date?   [x]  Yes []  No     If No - Date of Echo (dd/MMM/yyyy):       Send the Echo to the Core Lab as soon after acquisition as possible.      Biomarkers   Were the blood samples drawn for the biomarkers? [x]  Yes (Complete Below) []  No (Complete Protocol Deviation)   Was the assessment collected on the same date as the Visit Date? [x]  Yes []  No   If no - Date blood samples drawn (dd/MMM/yyyy):      Were all of the biomarker samples shipped to the lab?   [x]  Yes (Complete Below) []  No (Complete Protocol Deviation)   Date blood samples shipped (dd/MMM/yyyy):  29/Mar/2023     Were all the samples viable for analysis:   [x]  Yes []  No (Complete Protocol Deviation)              MD reviewed all labs: Yes [x]    No []     6 Minute Walk 6 Minute Walk  Was the 6 minute walk performed? [x]  Yes []  No (Complete Protocol Deviation)   Date and Time of 6-minute walk (dd/MMM/yyyy [id number] clock):  01/20/22          TIME:1030      See worksheet for results     24 Hour Holter   Was the 24 Hour Holter applied? [x]  Yes (Complete Below) []  No (Complete Protocol Deviation)   Was the assessment collected on the same date as the Visit Date? [x]  Yes []  No   If no - Date 24 Hour Holter applied (dd/MMM/yyyy):     Was the 24 Hour Holter card uploaded or sent to the core lab?  [x]  Yes (Complete Below) []  No (Complete Protocol Deviation)    Date 24 Hour Holter uploaded or sent to the core lab (dd/MMM/yyyy): 03/Apr/2023     Was there confirmation from the Holter core lab that the data was interpretable? Please do not erase card  until after confirmation the data were received and interpretable   []  Yes []  No (Complete Protocol Deviation)        KCCQ  Use subject completed source. Template is also in regulatory binder.  Was the KCCQ administered and completed? [x]  Yes (Complete Below) []  No (Complete Protocol Deviation)   Was the assessment collected on the same date as the Visit Date? [x]  Yes []  No   If no - Date KCCQ administered (dd/MMM/yyyy):     Was the KCCQ  administered In Person (at the site) or At Home (remotely)? [x]  In Person []  At Home     See work sheet in patient binder.  Used subject completed source.           EQ-5D Was the EQ-5D collected:   [x]  Yes (Complete Below) []  No (Complete Protocol Deviation)  Was the assessment administered on the same date as the Visit Date? [x]  Yes []  No  Date EQ-5D administered 29/Mar/2023        DD                  MMM                             YYYY   Was the EQ-5D administered In Person (at the site) or At Home (remotely)? [x]  In Person []  At Home      See work sheet in patient binder. Used subject completed source.                                                        Cardiopulmonary Exam  Was the CP Exam Performed? [x]  Yes (Complete Below) []  No (Complete Protocol Deviation)   Was the assessment collected on the same date as the Visit Date?  Date CP Exam Performed (dd/MMM/yyyy):  [x]  Yes []  No      Heart Sounds  S3: [x]  Absent []  Present   S4: [x]  Absent []  Present  Murmur  Murmur: []  Yes [x]  No   If Yes:  []  Systolic []  Diastolic   If yes select grade: []  I/VI []  II/VI []  III/VI []  IV/VI []  V/VI []  VI/VI   Chest Sounds  Rales: []  Yes [x]  No   If yes check one: []  ? 1/3 of lung fields full []  1/2 of lung fields full []  > 1/2 of lung fields full  If yes check one: []  Left []  Right []  Bilateral    Edema   Edema (pedal): [x]  Absent []  Trace []  1+ (slight) []  2+ (moderate) []  3+ (severe)     Hepatomegaly  Hepatomegaly: [x]  None []  < 2 cm []  2-4 cm []  > 4 cm  Peripheral Pulses  Peripheral Pulses: [x]  Normal []  Abnormal   If Abnormal, explain:        Physical Exam    Physical Examination      Was the physical exam performed?    Was the assessment collected on the same date as the Visit Date?    If No - Date of Echo (dd/MMM/yyyy):    General Appearance     If abnormal, clinically significant?   Describe Clinically Relevant abnormalities:    Ophthalmologic     If abnormal, clinically significant?   Describe Clinically Relevant abnormalities:     HEENT     If abnormal, clinically significant?   Describe Clinically Relevant abnormalities:     Neurologic     If abnormal, clinically significant?  Describe Clinically Relevant abnormalities:     Gastrointestinal     If abnormal, clinically significant?   Describe Clinically Relevant abnormalities:    Dermatologic     If abnormal, clinically significant?  Describe Clinically Relevant abnormalities:     Musculoskeletal    If abnormal, clinically significant?    Describe Clinically Relevant abnormalities:         Other [x]  Yes []  No (complete a protocol deviation)   [x]  Yes []  No            [  x] Normal []  Abnormal []  Not Done  []  Yes []  No          [x]  Normal []  Abnormal []  Not Done  []  Yes []  No           [x]  Normal []  Abnormal []  Not Done  []  Yes []  No            [x] Normal  [] Abnormal  [] Not Done   [] Yes  [] No           [x] Normal  [] Abnormal  [] Not Done      [] Yes  [] No         [x] Normal  [] Abnormal  [] Not Done    [] Yes  [] No          [x] Normal  [] Abnormal  [] Not Done    [] Yes  [] No             [] Normal  [] Abnormal  [] Not Done   [] Yes  [] No           Other, specify:      Result:     If abnormal,  clinically significant?    Describe Clinically Relevant abnormalities     []  Normal []  Abnormal []  Not Done  []  Yes []  No               Con Meds/AEs  Con Meds/AEs   Did the subject experience any Concomitant Medication Changes, Adverse Events, Serious Adverse Events, Adverse Device Events, Device Malfunctions, Hospital Admissions, or HF Hospitalization Equivalent visits since time of consent?  []  Yes (Complete appropriate worksheet and for  [name] sites, collect billing information)  [x]  No       Health Status Question   Were the question asked   [x]  Yes  []  No  Was the assessment collected on the same date as the Visit date?  Date (dd/MMM/yyyy): [x]  Yes  []  No  Individual(s) questioned: check all that apply) [x]  Patient []  Caregiver/Carer [] Family Members []  Other individuals accompanying patient  Since you last visit here or the last time that we spoke with one another, have any of the following happened for the first time, or worsened, that we have not talked about previously? (check all that apply)  [] Coughing or Wheezing [] Chest pain []  Difficulty concentrating or decreased alertness [] fatigue and weakness []  Sensation of rapid or irregular heartbeat []  More difficulty getting you chores done or taking care of your self [] More shortness of breath  []  Lightheadness or fainting  [] Shortness of breath when you are physically active or when you lie down []  Swelling in your legs, ankles or feet, fluid retention,swelling around your stomach []   Water retention [x]  None of the above   Did you see a health care provider for any of these or other urgent reason []  Yes  [x]  No  If yes, Where? []  Clinic or office []  Emergency room, []  Urgent care  []  Hospital (stayed one or more nights) []    If yes, what kind of health care provider did you see? []  Emergency or urgent care  []  Family doctor, general practitioner [] Cardiologist (or heart failure doctor) []   Nurse  []  pharmacy  []  other    Other, specify    Since you last visit here or the last time that we spoke with one another, have you had any Concomitant Medication Changes? []  Yes ( Modify the appropriate Concomitant Medication form in the casebook) [x]  No  Since you last visit here or the last time that  we spoke with the subject, has the subject had any Adverse Events or Serious Adverse Events not captured above? [x]  Yes (create AE/SAE/Device Malfunction form as needed) []  No []  Not applicable  Since you last visit here or the last time that we spoke with one another have there been any other changes in how you ( the patient) have been feeling, or a health problem that we have not talking about?   IF yes, list:

## 2022-01-21 DIAGNOSIS — D696 Thrombocytopenia, unspecified: Secondary | ICD-10-CM | POA: Diagnosis not present

## 2022-01-21 DIAGNOSIS — I10 Essential (primary) hypertension: Secondary | ICD-10-CM | POA: Diagnosis not present

## 2022-01-21 DIAGNOSIS — M75102 Unspecified rotator cuff tear or rupture of left shoulder, not specified as traumatic: Secondary | ICD-10-CM | POA: Diagnosis not present

## 2022-01-21 DIAGNOSIS — N182 Chronic kidney disease, stage 2 (mild): Secondary | ICD-10-CM | POA: Diagnosis not present

## 2022-01-21 DIAGNOSIS — M720 Palmar fascial fibromatosis [Dupuytren]: Secondary | ICD-10-CM | POA: Diagnosis not present

## 2022-01-21 DIAGNOSIS — Z0001 Encounter for general adult medical examination with abnormal findings: Secondary | ICD-10-CM | POA: Diagnosis not present

## 2022-01-21 DIAGNOSIS — I509 Heart failure, unspecified: Secondary | ICD-10-CM | POA: Diagnosis not present

## 2022-01-21 DIAGNOSIS — G4730 Sleep apnea, unspecified: Secondary | ICD-10-CM | POA: Diagnosis not present

## 2022-01-21 DIAGNOSIS — Z95810 Presence of automatic (implantable) cardiac defibrillator: Secondary | ICD-10-CM | POA: Diagnosis not present

## 2022-01-21 DIAGNOSIS — I4891 Unspecified atrial fibrillation: Secondary | ICD-10-CM | POA: Diagnosis not present

## 2022-01-21 DIAGNOSIS — M5136 Other intervertebral disc degeneration, lumbar region: Secondary | ICD-10-CM | POA: Diagnosis not present

## 2022-01-21 DIAGNOSIS — R809 Proteinuria, unspecified: Secondary | ICD-10-CM | POA: Diagnosis not present

## 2022-02-02 ENCOUNTER — Ambulatory Visit (INDEPENDENT_AMBULATORY_CARE_PROVIDER_SITE_OTHER): Payer: Medicare Other

## 2022-02-02 DIAGNOSIS — I472 Ventricular tachycardia, unspecified: Secondary | ICD-10-CM

## 2022-02-02 DIAGNOSIS — I5022 Chronic systolic (congestive) heart failure: Secondary | ICD-10-CM | POA: Diagnosis not present

## 2022-02-02 LAB — CUP PACEART REMOTE DEVICE CHECK
Battery Remaining Longevity: 31 mo
Battery Remaining Percentage: 36 %
Battery Voltage: 2.9 V
Date Time Interrogation Session: 20230411020019
HighPow Impedance: 65 Ohm
HighPow Impedance: 65 Ohm
Implantable Lead Implant Date: 20160629
Implantable Lead Implant Date: 20160629
Implantable Lead Implant Date: 20180416
Implantable Lead Location: 753858
Implantable Lead Location: 753859
Implantable Lead Location: 753860
Implantable Lead Model: 7122
Implantable Pulse Generator Implant Date: 20180416
Lead Channel Impedance Value: 390 Ohm
Lead Channel Impedance Value: 400 Ohm
Lead Channel Impedance Value: 530 Ohm
Lead Channel Pacing Threshold Amplitude: 0.5 V
Lead Channel Pacing Threshold Amplitude: 0.625 V
Lead Channel Pacing Threshold Pulse Width: 0.5 ms
Lead Channel Pacing Threshold Pulse Width: 0.5 ms
Lead Channel Sensing Intrinsic Amplitude: 0.2 mV
Lead Channel Sensing Intrinsic Amplitude: 4.9 mV
Lead Channel Setting Pacing Amplitude: 2 V
Lead Channel Setting Pacing Amplitude: 2 V
Lead Channel Setting Pacing Pulse Width: 0.5 ms
Lead Channel Setting Pacing Pulse Width: 0.5 ms
Lead Channel Setting Sensing Sensitivity: 0.5 mV
Pulse Gen Serial Number: 7413271

## 2022-02-08 LAB — ECHOCARDIOGRAM COMPLETE
Calc EF: 29.5 %
MV M vel: 3.63 m/s
MV Peak grad: 52.7 mmHg
Radius: 0.4 cm
S' Lateral: 6.5 cm
Single Plane A2C EF: 28.3 %
Single Plane A4C EF: 29.9 %

## 2022-02-18 NOTE — Progress Notes (Signed)
Remote ICD transmission.   

## 2022-02-22 ENCOUNTER — Other Ambulatory Visit: Payer: Self-pay | Admitting: Cardiology

## 2022-04-20 ENCOUNTER — Other Ambulatory Visit: Payer: Self-pay | Admitting: Cardiology

## 2022-04-20 ENCOUNTER — Ambulatory Visit (HOSPITAL_COMMUNITY)
Admission: RE | Admit: 2022-04-20 | Discharge: 2022-04-20 | Disposition: A | Discharge status: Home / Self Care | Payer: Medicare Other | Source: Ambulatory Visit | Attending: Cardiology | Admitting: Cardiology

## 2022-04-20 ENCOUNTER — Encounter (HOSPITAL_COMMUNITY): Payer: Self-pay | Admitting: Cardiology

## 2022-04-20 VITALS — BP 94/70 | HR 98 | Wt 301.8 lb

## 2022-04-20 DIAGNOSIS — E8770 Fluid overload, unspecified: Secondary | ICD-10-CM | POA: Diagnosis not present

## 2022-04-20 DIAGNOSIS — Z79899 Other long term (current) drug therapy: Secondary | ICD-10-CM | POA: Diagnosis not present

## 2022-04-20 DIAGNOSIS — I5022 Chronic systolic (congestive) heart failure: Secondary | ICD-10-CM | POA: Diagnosis not present

## 2022-04-20 DIAGNOSIS — I428 Other cardiomyopathies: Secondary | ICD-10-CM | POA: Diagnosis not present

## 2022-04-20 DIAGNOSIS — Z95810 Presence of automatic (implantable) cardiac defibrillator: Secondary | ICD-10-CM | POA: Insufficient documentation

## 2022-04-20 DIAGNOSIS — I110 Hypertensive heart disease with heart failure: Secondary | ICD-10-CM | POA: Insufficient documentation

## 2022-04-20 DIAGNOSIS — I482 Chronic atrial fibrillation, unspecified: Secondary | ICD-10-CM | POA: Diagnosis not present

## 2022-04-20 DIAGNOSIS — I5040 Unspecified combined systolic (congestive) and diastolic (congestive) heart failure: Secondary | ICD-10-CM | POA: Diagnosis not present

## 2022-04-20 DIAGNOSIS — Z8249 Family history of ischemic heart disease and other diseases of the circulatory system: Secondary | ICD-10-CM | POA: Diagnosis not present

## 2022-04-20 DIAGNOSIS — Z7901 Long term (current) use of anticoagulants: Secondary | ICD-10-CM | POA: Insufficient documentation

## 2022-04-20 LAB — BASIC METABOLIC PANEL
Anion gap: 10 (ref 5–15)
BUN: 29 mg/dL — ABNORMAL HIGH (ref 6–20)
CO2: 26 mmol/L (ref 22–32)
Calcium: 10 mg/dL (ref 8.9–10.3)
Chloride: 103 mmol/L (ref 98–111)
Creatinine, Ser: 1.48 mg/dL — ABNORMAL HIGH (ref 0.61–1.24)
GFR, Estimated: 56 mL/min — ABNORMAL LOW (ref >60)
Glucose, Bld: 100 mg/dL — ABNORMAL HIGH (ref 70–99)
Potassium: 4.7 mmol/L (ref 3.5–5.1)
Sodium: 139 mmol/L (ref 135–145)

## 2022-04-20 MED ORDER — FUROSEMIDE 40 MG PO TABS: 60.0000 mg | ORAL_TABLET | Freq: Two times a day (BID) | ORAL | 3 refills | Status: AC

## 2022-04-20 MED ORDER — SPIRONOLACTONE 25 MG PO TABS: 50.0000 mg | ORAL_TABLET | Freq: Every day | ORAL | 3 refills | Status: AC

## 2022-04-20 NOTE — Progress Notes (Signed)
PCP: Benita Stabile, MD Cardiology: Dr. Wyline Mood HF Cardiology: Dr. Shirlee Latch  56 y.o. with history of nonischemic cardiomyopathy and chronic atrial fibrillation was referred by Dr. Wyline Mood for CHF evaluation.  Patient was diagnosed with CHF about 10 years ago.  He has had persistently low EF.  Most recent echo in 2/19 showed EF 20-25%.  Cath in 1/16 showed no significant CAD.  He has a [employer] CRT-D device.  He has been in chronic atrial fibrillation for years now.  He had ablation x 2 with breakthrough AF, also failed Tikosyn and amiodarone. 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  No ETOH or smoking.    Echo (12/21) showed EF 20-25%, severe LV dilation, mild LVH, moderate RV enlargement with moderately decreased systolic function.  Echo in 5/22 showed EF < 20%, severe LV dilation, mild RV enlargement with normal RV function, mild-moderate MR, dilated IVC.   Patient was enrolled in ANTHEM trial but is in the usual treatment arm so did not get a device.   CPX in 7/22 showed mild-moderate HF limitation.  Echo in 10/22 showed EF 20%, moderate LV enlargement, moderate RV dysfunction, mild MR.  Echo in 3/23 showed EF 20-25%, severe LV dilation, moderate RV dysfunction, moderate MR, dilated IVC.   Patient returns for followup of CHF.  He had a VT episode in 3/23 without loss of consciousness.  No dyspnea walking on flat ground.  Mild dyspnea walking up a hill.  Rare mild lightheadedness.  Occasional abdominal fullness.  No orthopnea/PND. Weight down 2 lbs.      Labs (6/21): TSH normal, hgb 13.6, creatinine 0.97, LDL 97 Labs (3/22): K 5.1, creatinine 1.14 Labs (8/22): K 4.3, creatinine 1.13 Labs (3/23): K 3.7, creatinine 1.35  PMH: 1. Atrial fibrillation: Chronic. He has had ablation x 2 with breakthrough atrial fibrillation. He failed Tikosyn and amiodarone.  2. H/o VT 3. OSA: Tries to use CPAP.  4. Obesity 5. Chronic systolic CHF: Nonischemic cardiomyopathy. St Jude CRT-D  device.  - Echo (2013): EF 40%.  - Echo (1/16): EF 15-20%.  - LHC (1/16): Normal coronaries.  - Echo (2/19): EF 20-25% - Echo (12/21): EF 20-25%, severe LV dilation, mild LVH, moderate RV enlargement with moderately decreased systolic function.  - CPX (12/21): RER 1.18, VE/VCO2 slope 29, peak VO2 13.1.  No significant HF limitation, primarily limited by deconditioning.  - Echo (5/22): EF < 20%, severe LV dilation, mild RV enlargement with normal RV function, mild-moderate MR, dilated IVC.  - ANTHEM trial in usual treatment arm.  - CPX (7/22): peak VO2 12.6, VE/VCO2 slope 31, RER 1.2.  Mild-moderate HF limitation, limited also by obesity/body habitus.  - Echo (10/22): EF 20%, moderate LV enlargement, moderate RV dysfunction, mild MR. - Echo (3/23): EF 20-25%, severe LV dilation, moderate RV dysfunction, moderate MR, dilated IVC.  FH: Grandfather died of MI at 19.  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   ROS: All systems reviewed and negative except as per HPI.   Current Outpatient Medications  Medication Sig Dispense Refill   acetaminophen (TYLENOL) 500 MG tablet Take 1,000 mg by mouth daily as needed for mild pain or headache.      ENTRESTO 97-103 MG TAKE 1 TABLET BY MOUTH TWICE DAILY 60 tablet 6   metoprolol succinate (TOPROL-XL) 100 MG 24 hr tablet TAKE (1) TABLET BY MOUTH TWICE A DAY AFTER MEALS (BREAKFAST AND SUPPER). 60 tablet 6  rivaroxaban (XARELTO) 20 MG TABS tablet TAKE (1) TABLET BY MOUTH DAILY WITH SUPPER. 30 tablet 6   furosemide (LASIX) 40 MG tablet Take 1.5 tablets (60 mg total) by mouth 2 (two) times daily. 220 tablet 3   spironolactone (ALDACTONE) 25 MG tablet Take 2 tablets (50 mg total) by mouth daily. 180 tablet 3   No current facility-administered medications for this encounter.   BP 94/70   Pulse 98   Wt (!) 136.9 kg (301 lb 12.8 oz)   SpO2 99%   BMI 40.93 kg/m  General: NAD Neck: JVP 9-10 cm, no thyromegaly or  thyroid nodule.  Lungs: Clear to auscultation bilaterally with normal respiratory effort. CV: Nondisplaced PMI.  Heart irregular S1/S2, no S3/S4, no murmur.  1+ ankle edema.  No carotid bruit.  Normal pedal pulses.  Abdomen: Soft, nontender, no hepatosplenomegaly, no distention.  Skin: Intact without lesions or rashes.  Neurologic: Alert and oriented x 3.  Psych: Normal affect. Extremities: No clubbing or cyanosis.  HEENT: Normal.   Assessment/Plan: 1. Chronic systolic CHF: Nonischemic cardiomyopathy x years.  St Jude BiV ICD.  Cath in 1/16 with no significant CAD.  CPX in 7/22 showed mild-moderate HF limitation.  Cause of cardiomyopathy uncertain, ?viral myocarditis.  He does not have a strong family history of cardiomyopathy.  His pacemaker is not compatible with cardiac MRI.  He is in chronic atrial fibrillation but BiV pacing percentage has been > 95% chronically.  Nonresponder to CRT.  Echo in 3/23 with EF 20-25%, severe LV dilation, moderate RV dysfunction, moderate MR, dilated IVC. NYHA class II, mild volume overload on exam.  - Continue Toprol XL 100 mg bid.  - Continue Entresto 97/103 bid. - Unable to tolerate SGLT2 inhibitor due to yeast infections.  May try again after he has lost some weight.  - Increase Lasix to 60 mg bid and spironolactone to 50 mg daily with BMET/BNP today and BMET in 10 days.  - We discussed baroreceptor activation therapy.  He is going to think about this and I will refer to Dr. Myra Gianotti if he wants to go forward with it.  2. Atrial fibrillation: Chronic, he has failed ablation x 2, Tikosyn, and amiodarone.   Plan for rate-control.  - Continue Toprol XL.  - Continue Xarelto 20 mg daily.   Followup in 1 month with APP.   Marca Ancona 04/20/2022

## 2022-04-20 NOTE — Patient Instructions (Signed)
Increase lasix to 60mg  Twice daily  Increase spironolactone to 50mg  daily  Labs done today, your results will be available in MyChart, we will contact you for abnormal readings.  Repeat blood work in 10 days   Your physician recommends that you schedule a follow-up appointment in: 4 weeks   If you have any questions or concerns before your next appointment please send a message through [place] or call our office at [phone].    TO LEAVE A MESSAGE FOR THE NURSE SELECT OPTION 2, PLEASE LEAVE A MESSAGE INCLUDING: YOUR NAME DATE OF BIRTH CALL BACK NUMBER REASON FOR CALL**this is important as we prioritize the call backs  YOU WILL RECEIVE A CALL BACK THE SAME DAY AS LONG AS YOU CALL BEFORE 4:00 PM  At the [facility], you and your health needs are our priority. As part of our continuing mission to provide you with exceptional heart care, we have created designated Provider Care Teams. These Care Teams include your primary Cardiologist (physician) and Advanced Practice Providers (APPs- Physician Assistants and Nurse Practitioners) who all work together to provide you with the care you need, when you need it.   You may see any of the following providers on your designated Care Team at your next follow up: Dr [address] [phone], NP Arvilla Meres, [facility] [place], [facility] [place], PharmD   Please be sure to bring in all your medications bottles to every appointment.

## 2022-04-21 NOTE — Addendum Note (Signed)
Encounter addended by: Linda Hedges, RN on: 04/21/2022 1:34 PM  Actions taken: Order list changed, Diagnosis association updated

## 2022-04-22 LAB — PRO B NATRIURETIC PEPTIDE: NT-Pro BNP: 4940 pg/mL — ABNORMAL HIGH (ref 0–121)

## 2022-04-26 ENCOUNTER — Ambulatory Visit (INDEPENDENT_AMBULATORY_CARE_PROVIDER_SITE_OTHER): Payer: Medicare Other | Admitting: Surgery

## 2022-04-26 ENCOUNTER — Encounter: Payer: Self-pay | Admitting: Surgery

## 2022-04-26 VITALS — BP 89/65 | HR 69 | Temp 98.3°F | Resp 20 | Ht 72.0 in | Wt 298.0 lb

## 2022-04-26 DIAGNOSIS — I5030 Unspecified diastolic (congestive) heart failure: Secondary | ICD-10-CM | POA: Diagnosis not present

## 2022-04-26 NOTE — Progress Notes (Signed)
[facility]  Patient name: Brandon Torres MRN: [id number] DOB: 22-Jan-1966 Sex: male   REQUESTING PROVIDER:    Dr. Shirlee Latch   REASON FOR CONSULT:    Barostim Evaluation  HISTORY OF PRESENT ILLNESS:   Brandon Torres is a 56 y.o. male, who is being referred for evaluation of Barostim therapy.  The patient has a history of nonischemic cardiomyopathy with ejection fraction from echocardiogram in March 2023 showing 20-25%.  He has had a cardiac catheterization in 2016 that does not show any significant coronary artery disease.  He has a pacemaker in place [facility] Jude CRT-D device).  He is on Xarelto for A-fib.  He has undergone ablation in the past.  The patient was enrolled in the ANTHEM trial but did not get randomized to the device arm.  PAST MEDICAL HISTORY    Past Medical History:  Diagnosis Date   AICD (automatic cardioverter/defibrillator) present    CHF (congestive heart failure) (HCC)    Chronic anticoagulation    Dysrhythmia    AFib   Morbid obesity (HCC)    Non-ischemic cardiomyopathy (HCC)    OSA on CPAP    "have mask; can't tolerate mask" (04/23/2015)   PAF (paroxysmal atrial fibrillation) (HCC)    RFA at [facility]+ dofetilide   PONV (postoperative nausea and vomiting)    nausea   Ventricular tachycardia (HCC)    Wears glasses      FAMILY HISTORY   Family History  Problem Relation Age of Onset   Prostate cancer Father    Heart attack Paternal Grandfather    Colon cancer Neg Hx     SOCIAL HISTORY:   Social History   Socioeconomic History   Marital status: Married    Spouse name: Not on file   Number of children: Not on file   Years of education: Not on file   Highest education level: Not on file  Occupational History   Occupation: Full time    Employer: [employer]  Tobacco Use   Smoking status: Never   Smokeless tobacco: Never  Vaping Use   Vaping Use: Never used  Substance  and Sexual Activity   Alcohol use: No    Alcohol/week: 0.0 standard drinks of alcohol   Drug use: No   Sexual activity: Not Currently    Partners: Female  Other Topics Concern   Not on file  Social History Narrative   Married for 26 years.Lives with wife.Retired.   Social Determinants of Health   Financial Resource Strain: Not on file  Food Insecurity: Not on file  Transportation Needs: Not on file  Physical Activity: Not on file  Stress: Not on file  Social Connections: Not on file  Intimate Partner Violence: Not on file    ALLERGIES:    Allergies  Allergen Reactions   Jardiance [Empagliflozin] Other (See Comments)    Caused UTI    CURRENT MEDICATIONS:    Current Outpatient Medications  Medication Sig Dispense Refill   acetaminophen (TYLENOL) 500 MG tablet Take 1,000 mg by mouth daily as needed for mild pain or headache.      ENTRESTO 97-103 MG TAKE 1 TABLET BY MOUTH TWICE DAILY 60 tablet 6   furosemide (LASIX) 40 MG tablet Take 1.5 tablets (60 mg total) by mouth 2 (two) times daily. 220 tablet 3   metoprolol succinate (TOPROL-XL) 100 MG 24 hr tablet TAKE (1) TABLET BY MOUTH TWICE A DAY AFTER MEALS (BREAKFAST AND SUPPER). 60 tablet 6  rivaroxaban (XARELTO) 20 MG TABS tablet TAKE (1) TABLET BY MOUTH DAILY WITH SUPPER. 30 tablet 6   spironolactone (ALDACTONE) 25 MG tablet Take 2 tablets (50 mg total) by mouth daily. 180 tablet 3   No current facility-administered medications for this visit.    REVIEW OF SYSTEMS:   [X]  denotes positive finding, [ ]  denotes negative finding Cardiac  Comments:  Chest pain or chest pressure:    Shortness of breath upon exertion: x   Short of breath when lying flat:    Irregular heart rhythm: x       Vascular    Pain in calf, thigh, or hip brought on by ambulation:    Pain in feet at night that wakes you up from your sleep:     Blood clot in your veins:    Leg swelling:         Pulmonary    Oxygen at home:    Productive  cough:     Wheezing:         Neurologic    Sudden weakness in arms or legs:     Sudden numbness in arms or legs:     Sudden onset of difficulty speaking or slurred speech:    Temporary loss of vision in one eye:     Problems with dizziness:         Gastrointestinal    Blood in stool:      Vomited blood:         Genitourinary    Burning when urinating:     Blood in urine:        Psychiatric    Major depression:         Hematologic    Bleeding problems:    Problems with blood clotting too easily:        Skin    Rashes or ulcers:        Constitutional    Fever or chills:     PHYSICAL EXAM:   Vitals:   04/26/22 1437  BP: (!) 89/65  Pulse: 69  Resp: 20  Temp: 98.3 F (36.8 C)  SpO2: 97%  Weight: 298 lb (135.2 kg)  Height: 6' (1.829 m)    GENERAL: The patient is a well-nourished male, in no acute distress. The vital signs are documented above. CARDIAC: There is a regular rate and rhythm.  VASCULAR: Right carotid artery was evaluated with the SonoSite.  His bifurcation is in the mid neck PULMONARY: Nonlabored respirations MUSCULOSKELETAL: There are no major deformities or cyanosis. NEUROLOGIC: No focal weakness or paresthesias are detected. SKIN: There are no ulcers or rashes noted. PSYCHIATRIC: The patient has a normal affect.  STUDIES:   I have reviewed his ultrasound with the following findings: Right Carotid: Velocities in the right ICA are consistent with a 1-39%  stenosis.                 The ECA appears <50% stenosed.   Vertebrals:  Right vertebral artery demonstrates antegrade flow.  Subclavians: Normal flow hemodynamics were seen in the right subclavian  artery.  ASSESSMENT and PLAN   NYHA class III heart failure: The patient has been referred by his heart failure physician for [facility] implant.  The patient is on goal-directed medical therapy and remains symptomatic.  I think he would be a good candidate for the device.  I discussed the details  of the procedure including the risks and benefits with the patient and his wife and they wish to  proceed.  He will need to stop his Xarelto prior to his procedure.  We will schedule this once we get insurance approval.   Leia Alf, MD, FACS [facility] [phone] Pager [phone]

## 2022-04-27 DIAGNOSIS — Z950 Presence of cardiac pacemaker: Secondary | ICD-10-CM | POA: Diagnosis not present

## 2022-04-27 DIAGNOSIS — I469 Cardiac arrest, cause unspecified: Secondary | ICD-10-CM | POA: Diagnosis not present

## 2022-04-29 ENCOUNTER — Telehealth: Payer: Self-pay

## 2022-04-29 ENCOUNTER — Telehealth (HOSPITAL_COMMUNITY): Payer: Self-pay | Admitting: *Deleted

## 2022-04-29 NOTE — Telephone Encounter (Signed)
I spoke with the patient wife and gave her my deepest condolences. I gave her the number to [employer] tech support to order a return kit. I took him out of [place] and marked him deceased in [place].

## 2022-04-29 NOTE — Telephone Encounter (Signed)
Wife lvm pt passed away 05-20-2022

## 2022-04-29 NOTE — Telephone Encounter (Signed)
LMOVM for patient wife.

## 2022-05-05 ENCOUNTER — Encounter: Payer: Self-pay | Admitting: Surgery

## 2022-05-19 ENCOUNTER — Encounter (HOSPITAL_COMMUNITY): Payer: Medicare Other

## 2022-05-25 DEATH — deceased

## 2022-07-01 ENCOUNTER — Encounter: Payer: Medicare Other | Admitting: Internal Medicine

## 2022-07-23 ENCOUNTER — Ambulatory Visit: Payer: Medicare Other | Admitting: Cardiology
